# Patient Record
Sex: Female | Born: 1945 | Race: Black or African American | Hispanic: No | Marital: Married | Smoking: Former smoker
Health system: Southern US, Community
[De-identification: ages and names within clinical notes are randomized; demographics above are authoritative.]

## PROBLEM LIST (undated history)

## (undated) DIAGNOSIS — K219 Gastro-esophageal reflux disease without esophagitis: Secondary | ICD-10-CM

## (undated) DIAGNOSIS — E785 Hyperlipidemia, unspecified: Secondary | ICD-10-CM

## (undated) DIAGNOSIS — I10 Essential (primary) hypertension: Secondary | ICD-10-CM

## (undated) HISTORY — DX: Gastro-esophageal reflux disease without esophagitis: K21.9

## (undated) HISTORY — DX: Essential (primary) hypertension: I10

## (undated) HISTORY — DX: Hyperlipidemia, unspecified: E78.5

## (undated) HISTORY — PX: CHOLECYSTECTOMY: SHX55

---

## 1999-10-21 ENCOUNTER — Emergency Department (HOSPITAL_COMMUNITY): Admission: EM | Admit: 1999-10-21 | Discharge: 1999-10-21 | Payer: Self-pay | Admitting: Emergency Medicine

## 1999-10-22 ENCOUNTER — Encounter: Payer: Self-pay | Admitting: Emergency Medicine

## 2000-10-18 ENCOUNTER — Ambulatory Visit (HOSPITAL_COMMUNITY): Admission: RE | Admit: 2000-10-18 | Discharge: 2000-10-18 | Payer: Self-pay | Admitting: Family Medicine

## 2000-10-18 ENCOUNTER — Encounter: Payer: Self-pay | Admitting: Family Medicine

## 2001-06-12 ENCOUNTER — Other Ambulatory Visit: Admission: RE | Admit: 2001-06-12 | Discharge: 2001-06-12 | Payer: Self-pay | Admitting: Family Medicine

## 2001-10-23 ENCOUNTER — Ambulatory Visit (HOSPITAL_COMMUNITY): Admission: RE | Admit: 2001-10-23 | Discharge: 2001-10-23 | Payer: Self-pay | Admitting: Family Medicine

## 2001-10-23 ENCOUNTER — Encounter: Payer: Self-pay | Admitting: Family Medicine

## 2002-06-16 ENCOUNTER — Encounter: Payer: Self-pay | Admitting: Family Medicine

## 2002-06-16 ENCOUNTER — Ambulatory Visit (HOSPITAL_COMMUNITY): Admission: RE | Admit: 2002-06-16 | Discharge: 2002-06-16 | Payer: Self-pay | Admitting: Family Medicine

## 2002-06-23 ENCOUNTER — Encounter: Payer: Self-pay | Admitting: *Deleted

## 2002-06-23 ENCOUNTER — Emergency Department (HOSPITAL_COMMUNITY): Admission: EM | Admit: 2002-06-23 | Discharge: 2002-06-23 | Payer: Self-pay | Admitting: *Deleted

## 2002-10-27 ENCOUNTER — Ambulatory Visit (HOSPITAL_COMMUNITY): Admission: RE | Admit: 2002-10-27 | Discharge: 2002-10-27 | Payer: Self-pay | Admitting: Family Medicine

## 2002-10-27 ENCOUNTER — Encounter: Payer: Self-pay | Admitting: Family Medicine

## 2003-03-13 ENCOUNTER — Ambulatory Visit (HOSPITAL_COMMUNITY): Admission: RE | Admit: 2003-03-13 | Discharge: 2003-03-13 | Payer: Self-pay | Admitting: Family Medicine

## 2003-10-30 ENCOUNTER — Ambulatory Visit (HOSPITAL_COMMUNITY): Admission: RE | Admit: 2003-10-30 | Discharge: 2003-10-30 | Payer: Self-pay | Admitting: Family Medicine

## 2004-05-12 ENCOUNTER — Ambulatory Visit: Payer: Self-pay | Admitting: Family Medicine

## 2004-06-14 ENCOUNTER — Ambulatory Visit: Payer: Self-pay | Admitting: Family Medicine

## 2004-11-01 ENCOUNTER — Ambulatory Visit (HOSPITAL_COMMUNITY): Admission: RE | Admit: 2004-11-01 | Discharge: 2004-11-01 | Payer: Self-pay | Admitting: Family Medicine

## 2004-11-17 ENCOUNTER — Ambulatory Visit: Payer: Self-pay | Admitting: Family Medicine

## 2005-05-09 ENCOUNTER — Ambulatory Visit: Payer: Self-pay | Admitting: Family Medicine

## 2005-06-20 ENCOUNTER — Ambulatory Visit: Payer: Self-pay | Admitting: Family Medicine

## 2005-10-06 ENCOUNTER — Ambulatory Visit: Payer: Self-pay | Admitting: Family Medicine

## 2005-11-02 ENCOUNTER — Ambulatory Visit (HOSPITAL_COMMUNITY): Admission: RE | Admit: 2005-11-02 | Discharge: 2005-11-02 | Payer: Self-pay | Admitting: Family Medicine

## 2005-11-03 ENCOUNTER — Ambulatory Visit: Payer: Self-pay | Admitting: Family Medicine

## 2005-12-19 ENCOUNTER — Ambulatory Visit (HOSPITAL_COMMUNITY): Admission: RE | Admit: 2005-12-19 | Discharge: 2005-12-19 | Payer: Self-pay | Admitting: General Surgery

## 2006-05-29 ENCOUNTER — Ambulatory Visit: Payer: Self-pay | Admitting: Family Medicine

## 2006-07-06 ENCOUNTER — Encounter: Payer: Self-pay | Admitting: Family Medicine

## 2006-07-06 LAB — CONVERTED CEMR LAB
Alkaline Phosphatase: 99 units/L (ref 39–117)
Basophils Absolute: 0 10*3/uL (ref 0.0–0.1)
Basophils Relative: 1 % (ref 0–1)
Bilirubin, Direct: 0.1 mg/dL (ref 0.0–0.3)
Creatinine, Ser: 0.73 mg/dL (ref 0.40–1.20)
Eosinophils Absolute: 0.1 10*3/uL (ref 0.0–0.7)
Eosinophils Relative: 1 % (ref 0–5)
Glucose, Bld: 92 mg/dL (ref 70–99)
Hemoglobin: 13.7 g/dL (ref 12.0–15.0)
Indirect Bilirubin: 0.4 mg/dL (ref 0.0–0.9)
LDL Cholesterol: 94 mg/dL (ref 0–99)
Monocytes Absolute: 0.3 10*3/uL (ref 0.2–0.7)
Neutro Abs: 1.9 10*3/uL (ref 1.7–7.7)
Sodium: 141 meq/L (ref 135–145)
TSH: 2.081 microintl units/mL (ref 0.350–5.50)
Triglycerides: 39 mg/dL (ref <150)
WBC: 4.3 10*3/uL (ref 4.0–10.5)

## 2006-07-10 ENCOUNTER — Ambulatory Visit: Payer: Self-pay | Admitting: Family Medicine

## 2006-07-10 ENCOUNTER — Other Ambulatory Visit: Admission: RE | Admit: 2006-07-10 | Discharge: 2006-07-10 | Payer: Self-pay | Admitting: Family Medicine

## 2006-10-10 ENCOUNTER — Ambulatory Visit: Payer: Self-pay | Admitting: Family Medicine

## 2006-11-05 ENCOUNTER — Ambulatory Visit (HOSPITAL_COMMUNITY): Admission: RE | Admit: 2006-11-05 | Discharge: 2006-11-05 | Payer: Self-pay | Admitting: Family Medicine

## 2007-01-22 ENCOUNTER — Ambulatory Visit: Payer: Self-pay | Admitting: Family Medicine

## 2007-04-18 ENCOUNTER — Ambulatory Visit: Payer: Self-pay | Admitting: Family Medicine

## 2007-04-27 ENCOUNTER — Encounter: Payer: Self-pay | Admitting: Family Medicine

## 2007-04-27 LAB — CONVERTED CEMR LAB
AST: 18 units/L (ref 0–37)
Albumin: 4.6 g/dL (ref 3.5–5.2)
Alkaline Phosphatase: 84 units/L (ref 39–117)
Cholesterol: 173 mg/dL (ref 0–200)
HDL: 65 mg/dL (ref >39)
Total Bilirubin: 0.5 mg/dL (ref 0.3–1.2)
Total CHOL/HDL Ratio: 2.7
VLDL: 11 mg/dL (ref 0–40)

## 2007-05-09 ENCOUNTER — Encounter: Payer: Self-pay | Admitting: Family Medicine

## 2007-07-15 ENCOUNTER — Ambulatory Visit: Payer: Self-pay | Admitting: Family Medicine

## 2007-07-17 ENCOUNTER — Encounter: Payer: Self-pay | Admitting: Family Medicine

## 2007-09-02 ENCOUNTER — Encounter (INDEPENDENT_AMBULATORY_CARE_PROVIDER_SITE_OTHER): Payer: Self-pay | Admitting: *Deleted

## 2007-09-02 ENCOUNTER — Other Ambulatory Visit: Admission: RE | Admit: 2007-09-02 | Discharge: 2007-09-02 | Payer: Self-pay | Admitting: Family Medicine

## 2007-09-02 ENCOUNTER — Encounter: Payer: Self-pay | Admitting: Family Medicine

## 2007-09-02 ENCOUNTER — Ambulatory Visit: Payer: Self-pay | Admitting: Family Medicine

## 2007-09-05 ENCOUNTER — Ambulatory Visit (HOSPITAL_COMMUNITY): Admission: RE | Admit: 2007-09-05 | Discharge: 2007-09-05 | Payer: Self-pay | Admitting: Family Medicine

## 2007-09-17 ENCOUNTER — Encounter (INDEPENDENT_AMBULATORY_CARE_PROVIDER_SITE_OTHER): Payer: Self-pay | Admitting: *Deleted

## 2007-09-17 DIAGNOSIS — I10 Essential (primary) hypertension: Secondary | ICD-10-CM | POA: Insufficient documentation

## 2007-09-17 DIAGNOSIS — E785 Hyperlipidemia, unspecified: Secondary | ICD-10-CM | POA: Insufficient documentation

## 2007-09-17 DIAGNOSIS — M129 Arthropathy, unspecified: Secondary | ICD-10-CM | POA: Insufficient documentation

## 2007-09-17 DIAGNOSIS — K219 Gastro-esophageal reflux disease without esophagitis: Secondary | ICD-10-CM | POA: Insufficient documentation

## 2007-09-17 DIAGNOSIS — M19019 Primary osteoarthritis, unspecified shoulder: Secondary | ICD-10-CM | POA: Insufficient documentation

## 2007-09-17 DIAGNOSIS — M8580 Other specified disorders of bone density and structure, unspecified site: Secondary | ICD-10-CM | POA: Insufficient documentation

## 2007-09-23 ENCOUNTER — Ambulatory Visit: Payer: Self-pay | Admitting: Family Medicine

## 2007-10-09 ENCOUNTER — Ambulatory Visit (HOSPITAL_COMMUNITY): Admission: RE | Admit: 2007-10-09 | Discharge: 2007-10-09 | Payer: Self-pay | Admitting: Orthopedic Surgery

## 2007-11-08 ENCOUNTER — Encounter: Payer: Self-pay | Admitting: Family Medicine

## 2007-11-08 LAB — CONVERTED CEMR LAB
ALT: 15 units/L (ref 0–35)
AST: 19 units/L (ref 0–37)
Calcium: 9.4 mg/dL (ref 8.4–10.5)
Chloride: 107 meq/L (ref 96–112)
Cholesterol: 172 mg/dL (ref 0–200)
Creatinine, Ser: 0.77 mg/dL (ref 0.40–1.20)
Glucose, Bld: 93 mg/dL (ref 70–99)
Potassium: 4.1 meq/L (ref 3.5–5.3)
Sodium: 143 meq/L (ref 135–145)
Triglycerides: 44 mg/dL (ref <150)

## 2007-11-20 ENCOUNTER — Ambulatory Visit (HOSPITAL_COMMUNITY): Admission: RE | Admit: 2007-11-20 | Discharge: 2007-11-20 | Payer: Self-pay | Admitting: Family Medicine

## 2007-11-26 ENCOUNTER — Ambulatory Visit: Payer: Self-pay | Admitting: Family Medicine

## 2007-12-02 ENCOUNTER — Ambulatory Visit (HOSPITAL_COMMUNITY): Admission: RE | Admit: 2007-12-02 | Discharge: 2007-12-02 | Payer: Self-pay | Admitting: Family Medicine

## 2008-02-19 ENCOUNTER — Encounter: Payer: Self-pay | Admitting: Family Medicine

## 2008-03-30 ENCOUNTER — Ambulatory Visit: Payer: Self-pay | Admitting: Family Medicine

## 2008-03-30 DIAGNOSIS — J301 Allergic rhinitis due to pollen: Secondary | ICD-10-CM | POA: Insufficient documentation

## 2008-04-04 ENCOUNTER — Emergency Department (HOSPITAL_COMMUNITY): Admission: EM | Admit: 2008-04-04 | Discharge: 2008-04-04 | Payer: Self-pay | Admitting: Emergency Medicine

## 2008-04-13 ENCOUNTER — Ambulatory Visit: Payer: Self-pay | Admitting: Family Medicine

## 2008-04-13 LAB — CONVERTED CEMR LAB
Bilirubin Urine: NEGATIVE
Glucose, Urine, Semiquant: NEGATIVE
Ketones, urine, test strip: NEGATIVE
Nitrite: NEGATIVE
Protein, U semiquant: NEGATIVE
Specific Gravity, Urine: <1.005
Urobilinogen, UA: 0.2
pH: 5.5

## 2008-04-15 ENCOUNTER — Encounter: Payer: Self-pay | Admitting: Family Medicine

## 2008-04-22 ENCOUNTER — Telehealth: Payer: Self-pay | Admitting: Family Medicine

## 2008-07-18 ENCOUNTER — Encounter: Payer: Self-pay | Admitting: Family Medicine

## 2008-07-18 LAB — CONVERTED CEMR LAB
ALT: 23 units/L (ref 0–35)
AST: 25 units/L (ref 0–37)
Albumin: 4.4 g/dL (ref 3.5–5.2)
Alkaline Phosphatase: 70 units/L (ref 39–117)
Bilirubin, Direct: 0.1 mg/dL (ref 0.0–0.3)
Indirect Bilirubin: 0.4 mg/dL (ref 0.0–0.9)
LDL Cholesterol: 78 mg/dL (ref 0–99)
Total Bilirubin: 0.5 mg/dL (ref 0.3–1.2)
Triglycerides: 53 mg/dL (ref <150)
VLDL: 11 mg/dL (ref 0–40)

## 2008-07-29 ENCOUNTER — Ambulatory Visit: Payer: Self-pay | Admitting: Family Medicine

## 2008-08-14 ENCOUNTER — Ambulatory Visit: Payer: Self-pay | Admitting: Family Medicine

## 2008-08-14 DIAGNOSIS — R519 Headache, unspecified: Secondary | ICD-10-CM | POA: Insufficient documentation

## 2008-08-14 DIAGNOSIS — R51 Headache: Secondary | ICD-10-CM | POA: Insufficient documentation

## 2008-08-28 ENCOUNTER — Ambulatory Visit (HOSPITAL_COMMUNITY): Admission: RE | Admit: 2008-08-28 | Discharge: 2008-08-28 | Payer: Self-pay | Admitting: Family Medicine

## 2008-08-31 ENCOUNTER — Telehealth: Payer: Self-pay | Admitting: Family Medicine

## 2008-08-31 ENCOUNTER — Ambulatory Visit: Payer: Self-pay | Admitting: Family Medicine

## 2008-10-16 ENCOUNTER — Encounter: Payer: Self-pay | Admitting: Family Medicine

## 2008-10-19 ENCOUNTER — Encounter: Payer: Self-pay | Admitting: Family Medicine

## 2008-10-27 ENCOUNTER — Encounter (HOSPITAL_COMMUNITY): Admission: RE | Admit: 2008-10-27 | Discharge: 2008-11-26 | Payer: Self-pay | Admitting: Endocrinology

## 2008-10-27 ENCOUNTER — Ambulatory Visit (HOSPITAL_COMMUNITY): Payer: Self-pay | Admitting: Endocrinology

## 2008-11-13 ENCOUNTER — Telehealth: Payer: Self-pay | Admitting: Family Medicine

## 2008-11-23 ENCOUNTER — Ambulatory Visit: Payer: Self-pay | Admitting: Family Medicine

## 2008-11-23 ENCOUNTER — Encounter: Payer: Self-pay | Admitting: Family Medicine

## 2008-11-23 ENCOUNTER — Other Ambulatory Visit: Admission: RE | Admit: 2008-11-23 | Discharge: 2008-11-23 | Payer: Self-pay | Admitting: Family Medicine

## 2008-12-09 ENCOUNTER — Ambulatory Visit (HOSPITAL_COMMUNITY): Admission: RE | Admit: 2008-12-09 | Discharge: 2008-12-09 | Payer: Self-pay | Admitting: Family Medicine

## 2009-01-28 ENCOUNTER — Encounter (INDEPENDENT_AMBULATORY_CARE_PROVIDER_SITE_OTHER): Payer: Self-pay | Admitting: *Deleted

## 2009-01-28 LAB — CONVERTED CEMR LAB
AST: 22 units/L
Alkaline Phosphatase: 90 units/L
BUN: 16 mg/dL
CO2: 23 meq/L
Calcium: 9.4 mg/dL
Glucose, Bld: 105 mg/dL
HDL: 61 mg/dL
LDL Cholesterol: 135 mg/dL
Sodium: 140 meq/L
TSH: 1.921 microintl units/mL
Triglycerides: 57 mg/dL

## 2009-02-04 ENCOUNTER — Ambulatory Visit: Payer: Self-pay | Admitting: Family Medicine

## 2009-02-09 ENCOUNTER — Encounter: Payer: Self-pay | Admitting: Family Medicine

## 2009-02-10 LAB — CONVERTED CEMR LAB
Alkaline Phosphatase: 90 units/L (ref 39–117)
Basophils Absolute: 0 10*3/uL (ref 0.0–0.1)
Basophils Relative: 1 % (ref 0–1)
Bilirubin, Direct: 0.1 mg/dL (ref 0.0–0.3)
Calcium: 9.4 mg/dL (ref 8.4–10.5)
Eosinophils Absolute: 0.1 10*3/uL (ref 0.0–0.7)
Eosinophils Relative: 2 % (ref 0–5)
HDL: 61 mg/dL (ref >39)
Indirect Bilirubin: 0.3 mg/dL (ref 0.0–0.9)
MCHC: 33.8 g/dL (ref 30.0–36.0)
Monocytes Absolute: 0.4 10*3/uL (ref 0.1–1.0)
Monocytes Relative: 8 % (ref 3–12)
Neutrophils Relative %: 44 % (ref 43–77)
Platelets: 264 10*3/uL (ref 150–400)
Potassium: 4 meq/L (ref 3.5–5.3)
RDW: 12.9 % (ref 11.5–15.5)
Total Bilirubin: 0.4 mg/dL (ref 0.3–1.2)
Total CHOL/HDL Ratio: 3.4
Total Protein: 7.5 g/dL (ref 6.0–8.3)
Triglycerides: 57 mg/dL (ref <150)

## 2009-03-22 ENCOUNTER — Encounter: Payer: Self-pay | Admitting: Family Medicine

## 2009-04-15 ENCOUNTER — Ambulatory Visit: Payer: Self-pay | Admitting: Family Medicine

## 2009-05-12 ENCOUNTER — Ambulatory Visit: Payer: Self-pay | Admitting: Family Medicine

## 2009-05-31 ENCOUNTER — Telehealth: Payer: Self-pay | Admitting: Family Medicine

## 2009-06-22 ENCOUNTER — Ambulatory Visit: Payer: Self-pay | Admitting: Family Medicine

## 2009-06-22 DIAGNOSIS — R079 Chest pain, unspecified: Secondary | ICD-10-CM | POA: Insufficient documentation

## 2009-06-24 ENCOUNTER — Encounter: Payer: Self-pay | Admitting: Family Medicine

## 2009-07-08 ENCOUNTER — Encounter (INDEPENDENT_AMBULATORY_CARE_PROVIDER_SITE_OTHER): Payer: Self-pay | Admitting: *Deleted

## 2009-07-13 ENCOUNTER — Ambulatory Visit: Payer: Self-pay | Admitting: Cardiology

## 2009-07-14 ENCOUNTER — Encounter: Payer: Self-pay | Admitting: Cardiology

## 2009-07-26 ENCOUNTER — Ambulatory Visit: Payer: Self-pay | Admitting: Cardiology

## 2009-08-09 ENCOUNTER — Telehealth: Payer: Self-pay | Admitting: Family Medicine

## 2009-08-11 ENCOUNTER — Telehealth: Payer: Self-pay | Admitting: Family Medicine

## 2009-08-18 ENCOUNTER — Encounter: Payer: Self-pay | Admitting: Family Medicine

## 2009-09-03 ENCOUNTER — Telehealth: Payer: Self-pay | Admitting: Family Medicine

## 2009-10-05 ENCOUNTER — Encounter: Payer: Self-pay | Admitting: Family Medicine

## 2009-10-19 LAB — CONVERTED CEMR LAB
ALT: 20 units/L (ref 0–35)
AST: 22 units/L (ref 0–37)
Albumin: 4.4 g/dL (ref 3.5–5.2)
Basophils Relative: 0 % (ref 0–1)
Bilirubin, Direct: 0.1 mg/dL (ref 0.0–0.3)
CO2: 23 meq/L (ref 19–32)
Chloride: 106 meq/L (ref 96–112)
Cholesterol: 213 mg/dL — ABNORMAL HIGH (ref 0–200)
Creatinine, Ser: 0.79 mg/dL (ref 0.40–1.20)
Eosinophils Absolute: 0.1 10*3/uL (ref 0.0–0.7)
Hemoglobin: 13 g/dL (ref 12.0–15.0)
LDL Cholesterol: 143 mg/dL — ABNORMAL HIGH (ref 0–99)
Lymphs Abs: 2.1 10*3/uL (ref 0.7–4.0)
MCHC: 33.2 g/dL (ref 30.0–36.0)
Monocytes Absolute: 0.3 10*3/uL (ref 0.1–1.0)
Monocytes Relative: 6 % (ref 3–12)
Neutro Abs: 2.4 10*3/uL (ref 1.7–7.7)
Potassium: 4.5 meq/L (ref 3.5–5.3)
RBC: 4.48 M/uL (ref 3.87–5.11)
RDW: 12.7 % (ref 11.5–15.5)
Vit D, 25-Hydroxy: 32 ng/mL (ref 30–89)
WBC: 4.9 10*3/uL (ref 4.0–10.5)

## 2009-11-25 ENCOUNTER — Ambulatory Visit: Payer: Self-pay | Admitting: Family Medicine

## 2009-11-25 ENCOUNTER — Other Ambulatory Visit: Admission: RE | Admit: 2009-11-25 | Discharge: 2009-11-25 | Payer: Self-pay | Admitting: Family Medicine

## 2009-11-25 LAB — CONVERTED CEMR LAB: OCCULT 1: NEGATIVE

## 2009-11-29 ENCOUNTER — Encounter: Payer: Self-pay | Admitting: Family Medicine

## 2009-11-29 LAB — CONVERTED CEMR LAB: Pap Smear: NEGATIVE

## 2009-12-06 ENCOUNTER — Encounter: Payer: Self-pay | Admitting: Gastroenterology

## 2009-12-09 ENCOUNTER — Encounter: Payer: Self-pay | Admitting: Gastroenterology

## 2009-12-10 ENCOUNTER — Encounter: Payer: Self-pay | Admitting: Family Medicine

## 2009-12-10 ENCOUNTER — Ambulatory Visit (HOSPITAL_COMMUNITY): Admission: RE | Admit: 2009-12-10 | Discharge: 2009-12-10 | Payer: Self-pay | Admitting: Gastroenterology

## 2009-12-10 ENCOUNTER — Ambulatory Visit: Payer: Self-pay | Admitting: Gastroenterology

## 2009-12-13 ENCOUNTER — Ambulatory Visit (HOSPITAL_COMMUNITY): Admission: RE | Admit: 2009-12-13 | Discharge: 2009-12-13 | Payer: Self-pay | Admitting: Family Medicine

## 2009-12-20 ENCOUNTER — Telehealth: Payer: Self-pay | Admitting: Family Medicine

## 2010-01-12 ENCOUNTER — Ambulatory Visit: Payer: Self-pay | Admitting: Family Medicine

## 2010-05-11 ENCOUNTER — Telehealth: Payer: Self-pay | Admitting: Family Medicine

## 2010-05-16 LAB — CONVERTED CEMR LAB
ALT: 19 units/L (ref 0–35)
Albumin: 4.5 g/dL (ref 3.5–5.2)
BUN: 15 mg/dL (ref 6–23)
Chloride: 103 meq/L (ref 96–112)
Cholesterol: 250 mg/dL — ABNORMAL HIGH (ref 0–200)
Creatinine, Ser: 0.87 mg/dL (ref 0.40–1.20)
Glucose, Bld: 97 mg/dL (ref 70–99)
Potassium: 4.3 meq/L (ref 3.5–5.3)
Total CHOL/HDL Ratio: 4.9
Triglycerides: 73 mg/dL (ref <150)
VLDL: 15 mg/dL (ref 0–40)

## 2010-05-18 ENCOUNTER — Ambulatory Visit
Admission: RE | Admit: 2010-05-18 | Discharge: 2010-05-18 | Payer: Self-pay | Source: Home / Self Care | Attending: Family Medicine | Admitting: Family Medicine

## 2010-05-30 ENCOUNTER — Encounter: Payer: Self-pay | Admitting: Family Medicine

## 2010-06-07 NOTE — Progress Notes (Signed)
Summary: rx  Phone Note Call from Patient   Summary of Call: pt wants to know if rx has been sent to drug store.  [phone] Initial call taken by: Rudene Anda,  December 20, 2009 2:50 PM  Follow-up for Phone Call        returned call, patient not available, left message  did she happen to say what rx? did she check with  pharmacy? Follow-up by: Adella Hare LPN,  December 21, 2009 10:42 AM  Additional Follow-up for Phone Call Additional follow up Details #1::        pt stated that pharm had done faxed it over here  Additional Follow-up by: Rudene Anda,  December 21, 2009 11:36 AM    Additional Follow-up for Phone Call Additional follow up Details #2::    will you be sending in calcium nasal spray? Follow-up by: Adella Hare LPN,  December 21, 2009 2:42 PM  Additional Follow-up for Phone Call Additional follow up Details #3:: Details for Additional Follow-up Action Taken: spray faxed in pls let her know Additional Follow-up by: Syliva Overman MD,  December 21, 2009 5:05 PM  New/Updated Medications: MIACALCIN 200 UNIT/ML SOLN (CALCITONIN (SALMON)) 200 units once daily , alternate nostrils(1 spray total each day) Prescriptions: MIACALCIN 200 UNIT/ML SOLN (CALCITONIN (SALMON)) 200 units once daily , alternate nostrils(1 spray total each day)  #3.40ml x 11   Entered and Authorized by:   Syliva Overman MD   Signed by:   Syliva Overman MD on 12/21/2009   Method used:   [facility]        [facility] [phone]* (retail)       [address]       [place], [place]  [address]       Ph: [phone] or [phone]       Fax: [phone]   RxID:   [id number]  patient aware Adella Hare LPN  December 22, 2009 10:51 AM

## 2010-06-07 NOTE — Assessment & Plan Note (Signed)
Summary: flu shot  Nurse Visit   Allergies: 1)  ! Asa  Immunizations Administered:  Influenza Vaccine # 1:    Vaccine Type: Fluvax Non-MCR    Site: left deltoid    Mfr: novartis    Dose: 0.5 ml    Route: IM    Given by: Adella Hare LPN    Exp. Date: 09/2010    Lot #: [id number]    VIS given: 11/30/09 version given January 12, 2010.  Orders Added: 1)  Influenza Vaccine NON MCR [00028]

## 2010-06-07 NOTE — Letter (Signed)
Summary: TRIAGE SHEET  TRIAGE SHEET   Imported By: Rexene Alberts 12/06/2009 09:59:40  _____________________________________________________________________  External Attachment:    Type:   Image     Comment:   External Document  Appended Document: TRIAGE SHEET SUPREP.  Appended Document: TRIAGE SHEET Reviewed instructions with pt. [facility]

## 2010-06-07 NOTE — Progress Notes (Signed)
Summary: LAB  Phone Note Call from Patient   Summary of Call: NEEDS A LB ORDER AND WANTS TO KNOW WHEN TO DO THE LAB   CALL BACK AT [phone] Initial call taken by: Lind Guest,  September 03, 2009 9:27 AM  Follow-up for Phone Call        patient aware, labs ordered Follow-up by: Adella Hare LPN,  September 03, 2009 2:22 PM

## 2010-06-07 NOTE — Assessment & Plan Note (Signed)
Summary: OV   Vital Signs:  Patient profile:   65 year old female Menstrual status:  postmenopausal Height:      62 inches Weight:      142.50 pounds BMI:     26.16 O2 Sat:      95 % on Room air Temp:     98.2 degrees F oral Pulse rate:   83 / minute Pulse rhythm:   regular Resp:     16 per minute BP sitting:   120 / 70  (left arm)  Vitals Entered By: Worthy Keeler LPN (May 12, 2009 10:21 AM)  Nutrition Counseling: Patient's BMI is greater than 25 and therefore counseled on weight management options.  O2 Flow:  Room air CC: body aches, head congestion, cough (clear) x 3days Is Patient Diabetic? No Pain Assessment Patient in pain? no        Primary Care Provider:  Syliva Overman MD  CC:  body aches, head congestion, and cough (clear) x 3days.  History of Present Illness: 4 day h/o progressive head and chest congestion with generalised body aches and chills. No documented fever , but she thinks that she has ahd a temp. Prior to this she Colleen [name] been doing well, she has no other complaints on review.  Current Medications (verified): 1)  Omeprazole 20 Mg  Cpdr (Omeprazole) .... One Cap By Mouth Once Daily 2)  Mevacor 40 Mg Tabs (Lovastatin) .... Two Tablets At Bedtime 3)  Lisinopril-Hydrochlorothiazide 20-25 Mg Tabs (Lisinopril-Hydrochlorothiazide) .... Take 1 Tablet By Mouth Once A Day 4)  Zithromax Z-Pak 250 Mg Tabs (Azithromycin) .... 2 Tablets Today Then One Tablet Daily For The Next 4 Days 5)  Fluconazole 150 Mg Tabs (Fluconazole) .... Take 1 Tablet By Mouth Once A Day As Needed 6)  Tessalon Perles 100 Mg Caps (Benzonatate) .... Take 1 Capsule By Mouth Three Times A Day 7)  Tussionex Pennkinetic Er 8-10 Mg/44ml Lqcr (Chlorpheniramine-Hydrocodone) .... One Teaspoon Twice Daily As Needed For Cough  Allergies (verified): 1)  ! Asa  Review of Systems      See HPI Eyes:  Denies blurring and discharge. CV:  Denies chest pain or discomfort, palpitations, and swelling  of feet. GI:  Denies abdominal pain, change in bowel habits, constipation, diarrhea, nausea, and vomiting. GU:  Denies dysuria and urinary frequency. MS:  Complains of joint pain and muscle weakness; denies stiffness; 4 day history. Neuro:  Complains of headaches; denies seizures and sensation of room spinning; 4 day history with frontal and maxillary pressure. Psych:  Denies anxiety and depression. Heme:  Denies abnormal bruising and bleeding. Allergy:  Complains of seasonal allergies.  Physical Exam  General:  Well-developed,well-nourished,in no acute distress; alert,appropriate and cooperative throughout examination HEENT: No facial asymmetry,  EOMI, positive sinus tenderness, TM's Clear, oropharynx  pink and moist.   Chest: decreased air ntry, bilateral crackles and wheezes CVS: S1, S2, No murmurs, No S3.   Abd: Soft, Nontender.  MS: Adequate ROM spine, hips, shoulders and knees.  Ext: No edema.   CNS: CN 2-12 intact, power tone and sensation normal throughout.   Skin: Intact, no visible lesions or rashes.  Psych: Good eye contact, normal affect.  Memory intact, not anxious or depressed appearing.    Impression & Recommendations:  Problem # 1:  ACUTE SINUSITIS, UNSPECIFIED (ICD-461.9) Assessment Comment Only  Her updated medication list for this problem includes:    Tessalon Perles 100 Mg Caps (Benzonatate) .Marland Kitchen... Take 1 capsule by mouth three times a day  Tussionex Pennkinetic Er 8-10 Mg/42ml Lqcr (Chlorpheniramine-hydrocodone) ..... One teaspoon twice daily as needed for cough Zpack  Problem # 2:  ACUTE BRONCHITIS (ICD-466.0) Assessment: Comment Only  Her updated medication list for this problem includes:    Tessalon Perles 100 Mg Caps (Benzonatate) .Marland Kitchen... Take 1 capsule by mouth three times a day    Tussionex Pennkinetic Er 8-10 Mg/12ml Lqcr (Chlorpheniramine-hydrocodone) ..... One teaspoon twice daily as needed for cough Z pack  Problem # 3:  HYPERLIPIDEMIA  (ICD-272.4) Assessment: Comment Only  Her updated medication list for this problem includes:    Mevacor 40 Mg Tabs (Lovastatin) .Marland Kitchen..Marland Kitchen Two tablets at bedtime  Labs Reviewed: SGOT: 22 (01/28/2009)   SGPT: 16 (01/28/2009)   HDL:61 (01/28/2009), 58 (07/18/2008)  LDL:135 (01/28/2009), 78 (16/02/9603)  Chol:207 (01/28/2009), 147 (07/18/2008)  Trig:57 (01/28/2009), 53 (07/18/2008)  Problem # 4:  HYPERTENSION (ICD-401.9) Assessment: Improved  Her updated medication list for this problem includes:    Lisinopril-hydrochlorothiazide 20-25 Mg Tabs (Lisinopril-hydrochlorothiazide) .Marland Kitchen... Take 1 tablet by mouth once a day  BP today: 120/70 Prior BP: 130/82 (04/15/2009)  Labs Reviewed: K+: 4.0 (01/28/2009) Creat: : 0.82 (01/28/2009)   Chol: 207 (01/28/2009)   HDL: 61 (01/28/2009)   LDL: 135 (01/28/2009)   TG: 57 (01/28/2009)  Problem # 5:  GERD (ICD-530.81) Assessment: Unchanged  Her updated medication list for this problem includes:    Omeprazole 20 Mg Cpdr (Omeprazole) ..... One cap by mouth once daily  Complete Medication List: 1)  Omeprazole 20 Mg Cpdr (Omeprazole) .... One cap by mouth once daily 2)  Mevacor 40 Mg Tabs (Lovastatin) .... Two tablets at bedtime 3)  Lisinopril-hydrochlorothiazide 20-25 Mg Tabs (Lisinopril-hydrochlorothiazide) .... Take 1 tablet by mouth once a day 4)  Fluconazole 150 Mg Tabs (Fluconazole) .... Take 1 tablet by mouth once a day as needed 5)  Tessalon Perles 100 Mg Caps (Benzonatate) .... Take 1 capsule by mouth three times a day 6)  Tussionex Pennkinetic Er 8-10 Mg/15ml Lqcr (Chlorpheniramine-hydrocodone) .... One teaspoon twice daily as needed for cough  Patient Instructions: 1)  pLS call for your appt for you physical themiddle to end of this month, it is due July 20 or after. 2)  you are being treated for sinusitis and bronchitis,meds have been sent in Prescriptions: OMEPRAZOLE 20 MG  CPDR (OMEPRAZOLE) one cap by mouth once daily  #30 x 5   Entered by:    Worthy Keeler LPN   Authorized by:   Syliva Overman MD   Signed by:   Worthy Keeler LPN on 54/01/8118   Method used:   [facility]        [facility] [phone]* (retail)       [address]       [place], [place]  [address]       Ph: [phone] or [phone]       Fax: [phone]   RxID:   [id number] [name] ER 8-10 MG/5ML LQCR (CHLORPHENIRAMINE-HYDROCODONE) one teaspoon twice daily as needed for cough  #300cc x 0   Entered and Authorized by:   Syliva Overman MD   Signed by:   Syliva Overman MD on 05/12/2009   Method used:   Printed then faxed to ...       [facility] [phone]* (retail)       [address]       [place], [place]  [address]  Ph: [phone] or [phone]       Fax: [phone]   RxID:   [id number] TESSALON PERLES 100 MG CAPS (BENZONATATE) Take 1 capsule by mouth three times a day  #21 x 0   Entered and Authorized by:   Syliva Overman MD   Signed by:   Syliva Overman MD on 05/12/2009   Method used:   Electronically to        [facility] [phone]* (retail)       [address]       [place], [place]  [address]       Ph: [phone] or [phone]       Fax: [phone]   RxID:   [id number] FLUCONAZOLE 150 MG TABS (FLUCONAZOLE) Take 1 tablet by mouth once a day as needed  #2 x 0   Entered and Authorized by:   Syliva Overman MD   Signed by:   Syliva Overman MD on 05/12/2009   Method used:   Electronically to        [facility] [phone]* (retail)       [address]       [place], [place]  [address]       Ph: [phone] or [phone]       Fax: [phone]   RxID:   [id number] ZITHROMAX Z-PAK 250 MG TABS (AZITHROMYCIN) 2 tablets today then one tablet daily for the next 4 days  #6 x 0   Entered and Authorized by:   Syliva Overman MD   Signed by:   Syliva Overman MD on 05/12/2009   Method used:   Electronically to        [facility].  [phone]* (retail)       [address]       [place], [place]  [address]       Ph: [phone] or [phone]       Fax: [phone]   RxID:   [id number]

## 2010-06-07 NOTE — Letter (Signed)
Summary: Letter  Letter   Imported By: Lind Guest 11/30/2009 13:55:11  _____________________________________________________________________  External Attachment:    Type:   Image     Comment:   External Document

## 2010-06-07 NOTE — Progress Notes (Signed)
Summary: dermatology  dermatology   Imported By: Lind Guest 10/26/2009 08:25:24  _____________________________________________________________________  External Attachment:    Type:   Image     Comment:   External Document

## 2010-06-07 NOTE — Assessment & Plan Note (Signed)
Summary: np3 chest pain n   Visit Type:  Initial Consult Primary Provider:  Syliva Overman MD   History of Present Illness: Colleen [name] is seen at the kind request of Dr. Lodema Hong for evaluation of chest discomfort.  This very nice 65 year old woman has enjoyed generally excellent health.  She has had no cardiovascular issues, has never been seen by cardiologist and never undergone any significant cardiac testing.  Exercise tolerance is good.  Late llast year she retired from fairly demanding physical work at [employer].  She has maintained normal activity since, without difficulty.  Current issues began in January when she developed a severe upper respiratory infection with a marked cough.  She experienced chest discomfort and back discomfort in conjunction with that illness.  She has had residual anterior chest heaviness.  She also notes a localized area in the right lateral back with pain and tenderness. Discomfort is increased by coughing, sneezing and by motion of the trunk.  There is no association with walking, anxiety or stress.  It is not entirely clear whether there has been any trend in pain intensity, but my best guess is that it has been improving.  She has not had a significant cough for a number of weeks.   EKG  Procedure date:  30-Jul-2009  Findings:      Normal sinus rhythm Minor nonspecific T wave abnormality Otherwise normal EKG Comparison with prior study of 06/22/09, no significant interval change.   Current Medications (verified): 1)  Omeprazole 20 Mg  Cpdr (Omeprazole) .... One Cap By Mouth Once Daily 2)  Mevacor 40 Mg Tabs (Lovastatin) .... Two Tablets At Bedtime 3)  Lisinopril-Hydrochlorothiazide 20-25 Mg Tabs (Lisinopril-Hydrochlorothiazide) .... Take 1 Tablet By Mouth Once A Day 4)  Tessalon Perles 100 Mg Caps (Benzonatate) .... Take 1 Capsule By Mouth Three Times A Day 5)  Lumigan 0.03 % Soln (Bimatoprost) .Marland Kitchen.. 1 Drop Each Eye At Bedtime 6)  Tylenol 325 Mg Tabs  (Acetaminophen) .... Take As Needed For Pain  Allergies (verified): 1)  ! Jonne Ply  Past History:  Past Surgical History: Last updated: 10/04/07 Cholecystectomy Oct 03, 2000)  Family History: Last updated: 07/30/2009 Mother deceased in 2008-10-03 at age 71,h/o hypertension and CHF Father deceased - CVA 2 living sisters - x1 HTN, DM, MS and x1HTN, DM, CAD-coronary    stents 2 living brothers - x1 healthy and x1 HTN and bipolar  Social History: Last updated: 2009/07/30 Employed: Recently retired from [employer] Married; 2 adult children Tobacco-minimal and remote Alcohol use-no Drug use-no  Past Medical History: Chest pain HYPERLIPIDEMIA (ICD-272.4) HYPERTENSION (ICD-401.9) ARTHRITIS, SHOULDERS, BILATERAL and KNEES, 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) GERD (ICD-530.81) OSTEOPOROSIS (ICD-733.00) Glaucoma diagnosed 03/2008  Family History: Mother deceased in 32 at age 71,h/o hypertension and CHF Father deceased - CVA 2 living sisters - x1 HTN, DM, MS and x1HTN, DM, CAD-coronary    stents 2 living brothers - x1 healthy and x1 HTN and bipolar  Social History: Employed: Recently retired from [employer] Married; 2 adult children Tobacco-minimal and remote Alcohol use-no Drug use-no  Review of Systems       intermittent headaches; corrective lenses; intermittent eye discomfort; history of peptic ulcer disease; arthritic discomfort, particularly involving the knees, back and shoulders.  All other systems reviewed and are negative.  Vital Signs:  Patient profile:   65 year old female Menstrual status:  postmenopausal Weight:      148 pounds Pulse rate:   75 / minute BP sitting:   125 / 66  (right arm)  Vitals Entered ByLarita Fife Via LPN (July 14, 5782 2:17 PM)  Physical Exam  General:    Mildly overweight; well-developed; no acute distress: HEENT-[place]/AT; PERRL; EOM intact; conjunctiva and lids nl:  Neck-No JVD; no carotid bruits: Endocrine-No thyromegaly: Lungs-No tachypnea, clear  without rales, rhonchi or wheezes: Thorax: mild tenderness over the right lateral eighth rib and the uppersternal area CV-normal PMI; normal S1 and S2; S4 present Abdomen-BS normal; soft and non-tender without masses or organomegaly: MS-No deformities, cyanosis or clubbing;  excellent mobility of back without pain on motion Neurologic-Nl cranial nerves; symmetric strength and tone: Skin- Warm, no sig. lesions: Extremities-Nl distal pulses; no edema  Head:     Impression & Recommendations:  Problem # 1:  CHEST PAIN (ICD-786.50) Symptoms are quite atypical and most consistent with a musculoskeletal origin.  Based upon the history available to me, the likely etiology is chest wall injury related to cough.  The duration and persistence of her symptoms are somewhat out of proportion to the acute illness.  Nonetheless, there is such a low suspicion for coronary disease that provocative testing is not warranted.  I have suggested that she take low dose naproxen for the next week and then return to see me in 2 weeks at which time I suspect she will have dramatically improved.  If not, we will seek other etiologies for her chest discomfort.   Patient Instructions: 1)  Your physician recommends that you schedule a follow-up appointment in: 2 WEEKS 2)  Your physician has recommended you make the following change in your medication:  TAKE ALEVE OR NAPROXEN 200MG  TWICE A DAY FOR 2 WEEKS

## 2010-06-07 NOTE — Op Note (Signed)
Summary: Operative Report  Operative Report   Imported By: Lind Guest 12/14/2009 10:20:12  _____________________________________________________________________  External Attachment:    Type:   Image     Comment:   External Document

## 2010-06-07 NOTE — Progress Notes (Signed)
Summary: SKIN DR.  Phone Note Call from Patient   Summary of Call: WANTS A REF. TO A SKIN DR. CALL BACK AT [phone] WITH APPT. Initial call taken by: Lind Guest,  August 09, 2009 9:07 AM  Follow-up for Phone Call        pls document i   a tele note what exactly her skin complaint is then after i review will give ok to refer I do not see mntion of skin prob in my last 2 office notes so I need this clarifiede    Follow-up by: Syliva Overman MD,  August 10, 2009 12:22 AM  Additional Follow-up for Phone Call Additional follow up Details #1::        dark spot on arm came up and is itching Additional Follow-up by: Adella Hare LPN,  August 11, 2009 10:45 AM    Additional Follow-up for Phone Call Additional follow up Details #2::    pt was referred and has appt on 08/18/2009 8:30. pt was called and left a message.  Follow-up by: Rudene Anda,  August 11, 2009 1:41 PM

## 2010-06-07 NOTE — Assessment & Plan Note (Signed)
Summary: 2 WK F/U PER CHECKOUT ON 07/13/09/TG   Visit Type:  Follow-up Primary Provider:  Syliva Overman MD  CC:  No cardiology problems today.  History of Present Illness: Colleen [name] is a very pleasant 65 y/o AAF who was last seen by Dr. Dietrich Pates as the request of Dr. Lodema Hong for Right sided chest pain.  The pain began in the setting of an acute upper respiratory infection with frequent coughing.  Dr. Dietrich Pates felt the pain was most consistant with musculoskeletal pain, based on her history and symptoms.  She had a low suspicion for CAD.  Therefore, she was placed on NSAIDS for 2 weeks and was to follow-up with [place] for results.    She is feeling 100% better.  No recurrance of symptoms at all.  She says she took 2 tablets daily for 3-4 days and then decreased the dose to 1 a day and then has stopped for the last few days.  The pain is not reproducible with palpation on exam.  She is in good spirits.  Current Medications (verified): 1)  Omeprazole 20 Mg  Cpdr (Omeprazole) .... One Cap By Mouth Once Daily 2)  Mevacor 40 Mg Tabs (Lovastatin) .... Two Tablets At Bedtime 3)  Lisinopril-Hydrochlorothiazide 20-25 Mg Tabs (Lisinopril-Hydrochlorothiazide) .... Take 1 Tablet By Mouth Once A Day 4)  Lumigan 0.03 % Soln (Bimatoprost) .Marland Kitchen.. 1 Drop Each Eye At Bedtime 5)  Tylenol 325 Mg Tabs (Acetaminophen) .... Take As Needed For Pain 6)  Naproxen Sodium 220 Mg Tabs (Naproxen Sodium) .... Take 1 Tab Daily  Allergies (verified): 1)  ! Asa  Review of Systems       All other systems have been reviewed and are negative unless stated above.   Vital Signs:  Patient profile:   65 year old female Menstrual status:  postmenopausal Weight:      143 pounds Pulse rate:   73 / minute BP sitting:   104 / 59  (right arm)  Vitals Entered By: Colleen Saa, CNA (July 26, 2009 1:49 PM)  Physical Exam  General:  Well developed, well nourished, in no acute distress. Lungs:  Clear bilaterally to auscultation  and percussion. Heart:  Non-displaced PMI, chest non-tender; regular rate and rhythm, S1, S2 without murmurs, rubs or gallops. Carotid upstroke normal, no bruit. Normal abdominal aortic size, no bruits. Femorals normal pulses, no bruits. Pedals normal pulses. No edema, no varicosities. Abdomen:  Bowel sounds positive; abdomen soft and non-tender without masses, organomegaly, or hernias noted. No hepatosplenomegaly. Msk:  No recurrance of pain with palpation or movement. Psych:  Normal affect.   Impression & Recommendations:  Problem # 1:  CHEST PAIN (ICD-786.50) This is completely resolved with the use of NSAIDS.  She has no other complaints.  We will see her on a as needed basis only. Her updated medication list for this problem includes:    Lisinopril-hydrochlorothiazide 20-25 Mg Tabs (Lisinopril-hydrochlorothiazide) .Marland Kitchen... Take 1 tablet by mouth once a day  Patient Instructions: 1)  Your physician recommends that you schedule a follow-up appointment in: as needed  2)  Your physician recommends that you continue on your current medications as directed. Please refer to the Current Medication list given to you today.

## 2010-06-07 NOTE — Assessment & Plan Note (Signed)
Summary: CHEST HURTS FROM COUGHING   Vital Signs:  Patient profile:   65 year old female Menstrual status:  postmenopausal Height:      62 inches Weight:      146.75 pounds BMI:     26.94 O2 Sat:      99 % on Room air Pulse rate:   68 / minute Resp:     16 per minute BP sitting:   158 / 74  (left arm)  Vitals Entered By: Lilyan Gilford LPN (June 22, 2009 3:06 PM)  O2 Flow:  Room air CC: chest aches, goes into left shoulder blade and left side and down left arm x one month Is Patient Diabetic? No Pain Assessment Patient in pain? yes     Location: chest Intensity: 5 Type: aching Onset of pain  Constant   Primary Care Provider:  Syliva Overman MD  CC:  chest aches and goes into left shoulder blade and left side and down left arm x one month.  History of Present Illness: Pt reports 1 mos hx of intermittent chest pain.  No shortness of breath, or diaphoresis.  Seems to be more when lying down, either during a daytime nap, or at bedtime, & improves with sitting up or moving.  Does feel like a heaviness in the left side of her chest.    Pain radiates to her left shoulder area when occurs.    Her mother died from a MI, so she is worried that this is due to her heart.   Allergies (verified): 1)  ! Asa PMH-FH-SH reviewed for relevance  Review of Systems General:  Denies fatigue, fever, and weakness. CV:  Complains of chest pain or discomfort; denies difficulty breathing at night, difficulty breathing while lying down, fainting, fatigue, lightheadness, palpitations, shortness of breath with exertion, and swelling of feet. Resp:  Denies chest pain with inspiration, cough, and shortness of breath; pt states she had a cough about a mos ago, but has resolved. MS:  Denies muscle aches and thoracic pain. Neuro:  Denies numbness and tingling.  Physical Exam  General:  Well-developed,well-nourished,in no acute distress; alert,appropriate and cooperative throughout  examination Head:  Normocephalic and atraumatic without obvious abnormalities. No apparent alopecia or balding. Ears:  External ear exam shows no significant lesions or deformities.  Otoscopic examination reveals clear canals, tympanic membranes are intact bilaterally without bulging, retraction, inflammation or discharge. Hearing is grossly normal bilaterally. Nose:  External nasal examination shows no deformity or inflammation. Nasal mucosa are pink and moist without lesions or exudates. Mouth:  Oral mucosa and oropharynx without lesions or exudates.  Teeth in good repair. Neck:  No deformities, masses, or tenderness noted. Chest Wall:  No deformities, masses, or tenderness noted. Lungs:  Normal respiratory effort, chest expands symmetrically. Lungs are clear to auscultation, no crackles or wheezes. Heart:  Normal rate and regular rhythm. S1 and S2 normal without gallop, murmur, click, rub or other extra sounds. Extremities:  No clubbing, cyanosis, edema, or deformity noted with normal full range of motion of all joints.   Psych:  Cognition and judgment appear intact. Alert and cooperative with normal attention span and concentration. No apparent delusions, illusions, hallucinations   Impression & Recommendations:  Problem # 1:  CHEST PAIN (ICD-786.50)  Orders: EKG w/ Interpretation (93000) Cardiology Referral (Cardiology)  Discussed with pt due to symptoms being positionally associated may be chest wall & not cardiac.  Due to her PMH & FH will refer to Cardiology for evaluation.  Problem #  2:  HYPERTENSION (ICD-401.9) Assessment: Unchanged  Her updated medication list for this problem includes:    Lisinopril-hydrochlorothiazide 20-25 Mg Tabs (Lisinopril-hydrochlorothiazide) .Marland Kitchen... Take 1 tablet by mouth once a day  BP today: 158/74 Prior BP: 120/70 (05/12/2009)  Labs Reviewed: K+: 4.0 (01/28/2009) Creat: : 0.82 (01/28/2009)   Chol: 207 (01/28/2009)   HDL: 61 (01/28/2009)   LDL:  135 (01/28/2009)   TG: 57 (01/28/2009)  Problem # 3:  HYPERLIPIDEMIA (ICD-272.4) Assessment: Unchanged  Her updated medication list for this problem includes:    Mevacor 40 Mg Tabs (Lovastatin) .Marland Kitchen..Marland Kitchen Two tablets at bedtime  Labs Reviewed: SGOT: 22 (01/28/2009)   SGPT: 16 (01/28/2009)   HDL:61 (01/28/2009), 58 (07/18/2008)  LDL:135 (01/28/2009), 78 (04/54/0981)  Chol:207 (01/28/2009), 147 (07/18/2008)  Trig:57 (01/28/2009), 53 (07/18/2008)  Complete Medication List: 1)  Omeprazole 20 Mg Cpdr (Omeprazole) .... One cap by mouth once daily 2)  Mevacor 40 Mg Tabs (Lovastatin) .... Two tablets at bedtime 3)  Lisinopril-hydrochlorothiazide 20-25 Mg Tabs (Lisinopril-hydrochlorothiazide) .... Take 1 tablet by mouth once a day 4)  Fluconazole 150 Mg Tabs (Fluconazole) .... Take 1 tablet by mouth once a day as needed 5)  Tessalon Perles 100 Mg Caps (Benzonatate) .... Take 1 capsule by mouth three times a day 6)  Tussionex Pennkinetic Er 8-10 Mg/26ml Lqcr (Chlorpheniramine-hydrocodone) .... One teaspoon twice daily as needed for cough  Patient Instructions: 1)  Take 650-1000mg  of Tylenol every 4-6 hours as needed for relief of pain or comfort of fever AVOID taking more than 4000mg   in a 24 hour period (can cause liver damage in higher doses). 2)  If you have chest pain that persists, is more severe, has sweating or shortness breath with it please proceed immediately to the ER.

## 2010-06-07 NOTE — Assessment & Plan Note (Signed)
Summary: PHY   Vital Signs:  Patient profile:   65 year old female Menstrual status:  postmenopausal Height:      62 inches Weight:      146.50 pounds BMI:     26.89 O2 Sat:      94 % Pulse rate:   78 / minute Pulse rhythm:   regular Resp:     16 per minute BP sitting:   130 / 82  (left arm) Cuff size:   regular  Vitals Entered By: Everitt Amber LPN (November 25, 2009 8:06 AM)  Nutrition Counseling: Patient's BMI is greater than 25 and therefore counseled on weight management options. CC: CPE   Vision Screening:Left eye with correction: 20 / 25 Right eye with correction: 20 / 20 Both eyes with correction: 20 / 20  Color vision testing: normal      Vision Entered By: Everitt Amber LPN (November 25, 2009 8:12 AM)   Primary Care Provider:  Syliva Overman MD  CC:  CPE .  History of Present Illness: Reports  that she has been  doing well. Denies recent fever or chills. Denies sinus pressure, nasal congestion , ear pain or sore throat. Denies chest congestion, or cough productive of sputum. Denies chest pain, palpitations, PND, orthopnea or leg swelling. Denies abdominal pain, nausea, vomitting, diarrhea or constipation. Denies change in bowel movements or bloody stool. Denies dysuria , frequency, incontinence or hesitancy. Denies  joint pain, swelling, or reduced mobility. Denies headaches, vertigo, seizures. Denies depression, anxiety or insomnia. Denies  rash, lesions, or itch.     Current Medications (verified): 1)  Omeprazole 20 Mg  Cpdr (Omeprazole) .... One Cap By Mouth Once Daily 2)  Mevacor 40 Mg Tabs (Lovastatin) .... Two Tablets At Bedtime 3)  Lisinopril-Hydrochlorothiazide 20-25 Mg Tabs (Lisinopril-Hydrochlorothiazide) .... Take 1 Tablet By Mouth Once A Day 4)  Tylenol 325 Mg Tabs (Acetaminophen) .... Take As Needed For Pain 5)  Naproxen Sodium 220 Mg Tabs (Naproxen Sodium) .... Take 1 Tab Daily  Allergies (verified): 1)  ! Asa  Review of Systems      See  HPI General:  Complains of fatigue. Eyes:  Denies blurring, discharge, eye pain, and red eye. Endo:  Denies cold intolerance, excessive hunger, excessive thirst, excessive urination, heat intolerance, polyuria, and weight change. Heme:  Denies abnormal bruising and bleeding. Allergy:  Complains of seasonal allergies; denies hives or rash and itching eyes.  Physical Exam  General:  Well-developed,well-nourished,in no acute distress; alert,appropriate and cooperative throughout examination Head:  Normocephalic and atraumatic without obvious abnormalities. No apparent alopecia or balding. Eyes:  No corneal or conjunctival inflammation noted. EOMI. Perrla. Funduscopic exam benign, without hemorrhages, exudates or papilledema. Vision grossly normal. Ears:  External ear exam shows no significant lesions or deformities.  Otoscopic examination reveals clear canals, tympanic membranes are intact bilaterally without bulging, retraction, inflammation or discharge. Hearing is grossly normal bilaterally. Nose:  External nasal examination shows no deformity or inflammation. Nasal mucosa are pink and moist without lesions or exudates. Mouth:  pharynx pink and moist, fair dentition, and teeth missing.   Neck:  No deformities, masses, or tenderness noted. Chest Wall:  No deformities, masses, or tenderness noted. Breasts:  No mass, nodules, thickening, tenderness, bulging, retraction, inflamation, nipple discharge or skin changes noted.   Lungs:  Normal respiratory effort, chest expands symmetrically. Lungs are clear to auscultation, no crackles or wheezes. Heart:  Normal rate and regular rhythm. S1 and S2 normal without gallop, murmur, click, rub or other  extra sounds. Abdomen:  Bowel sounds positive,abdomen soft and non-tender without masses, organomegaly or hernias noted. Rectal:  No external abnormalities noted. Normal sphincter tone. No rectal masses or tenderness. Genitalia:  Normal introitus for age, no  external lesions, no vaginal discharge, mucosa pink and moist, no vaginal or cervical lesions, no vaginal atrophy, no friaility or hemorrhage, normal uterus size and position, no adnexal masses or tenderness Msk:  No deformity or scoliosis noted of thoracic or lumbar spine.   Pulses:  R and L carotid,radial,femoral,dorsalis pedis and posterior tibial pulses are full and equal bilaterally Extremities:  No clubbing, cyanosis, edema, or deformity noted with normal full range of motion of all joints.   Neurologic:  No cranial nerve deficits noted. Station and gait are normal. Plantar reflexes are down-going bilaterally. DTRs are symmetrical throughout. Sensory, motor and coordinative functions appear intact. Skin:  Intact without suspicious lesions or rashes Cervical Nodes:  No lymphadenopathy noted Axillary Nodes:  No palpable lymphadenopathy Inguinal Nodes:  No significant adenopathy Psych:  Cognition and judgment appear intact. Alert and cooperative with normal attention span and concentration. No apparent delusions, illusions, hallucinations   Impression & Recommendations:  Problem # 1:  SPECIAL SCREENING FOR MALIGNANT NEOPLASMS COLON (ICD-V76.51) Assessment Comment Only  Orders: Gastroenterology Referral (GI) Hemoccult Guaiac-1 spec.(in office) (82270)negative, colonscopy due  Problem # 2:  SCREENING FOR MALIGNANT NEOPLASM OF THE CERVIX (ICD-V76.2) Assessment: Comment Only  Orders: Pap Smear (04540)  Problem # 3:  HYPERLIPIDEMIA (ICD-272.4) Assessment: Deteriorated  Her updated medication list for this problem includes:    Mevacor 40 Mg Tabs (Lovastatin) .Marland Kitchen..Marland Kitchen Two tablets at bedtime  Labs Reviewed: SGOT: 22 (10/18/2009)   SGPT: 20 (10/18/2009)   HDL:54 (10/18/2009), 61 (01/28/2009)  LDL:143 (10/18/2009), 135 (01/28/2009)  Chol:213 (10/18/2009), 207 (01/28/2009)  Trig:79 (10/18/2009), 57 (01/28/2009) low fat diet discussed and encouraged, pt also to ensure sdhe takes meds as  prescribed, every night  Problem # 4:  HYPERTENSION (ICD-401.9) Assessment: Unchanged  Her updated medication list for this problem includes:    Lisinopril-hydrochlorothiazide 20-25 Mg Tabs (Lisinopril-hydrochlorothiazide) .Marland Kitchen... Take 1 tablet by mouth once a day  Orders: T-CMP with estimated GFR ([id number])  BP today: 130/82 Prior BP: 104/59 (07/26/2009)  Labs Reviewed: K+: 4.5 (10/18/2009) Creat: : 0.79 (10/18/2009)   Chol: 213 (10/18/2009)   HDL: 54 (10/18/2009)   LDL: 143 (10/18/2009)   TG: 79 (10/18/2009)  Complete Medication List: 1)  Omeprazole 20 Mg Cpdr (Omeprazole) .... One cap by mouth once daily 2)  Mevacor 40 Mg Tabs (Lovastatin) .... Two tablets at bedtime 3)  Lisinopril-hydrochlorothiazide 20-25 Mg Tabs (Lisinopril-hydrochlorothiazide) .... Take 1 tablet by mouth once a day 4)  Tylenol 325 Mg Tabs (Acetaminophen) .... Take as needed for pain 5)  Naproxen Sodium 220 Mg Tabs (Naproxen sodium) .... Take 1 tab daily  Other Orders: T-Lipid Profile ([id number]) Radiology Referral (Radiology) Radiology Referral (Radiology)  Patient Instructions: 1)  F/u in 5.5 months 2)  you will be referred for a colonscopy, mamogram and dexa. 3)  pls cut back on fried and fatty foods, also oils and cheese. 4)  continue to exercise every day fopr at leasrt 40 minurtes. 5)  pls take calcwith D 1200mg /1000iU daily. 6)  continue one multivitamin daily 7)  Lipid Panel prior to visit, ICD-9:  fasting lipid, and bMP with egFR in 6 months  Laboratory Results    Stool - Occult Blood Hemmoccult #1: negative Date: 11/25/2009 Comments: [id number] 9R 8/11 118 10/12       Laboratory  Results    Stool - Occult Blood Hemmoccult #1: negative Comments: 51180 9R 8/11 118 10/12

## 2010-06-07 NOTE — Progress Notes (Signed)
Summary: COUGH  Phone Note Call from Patient   Summary of Call: STILL HAS A COUGH AND HAD A HARDD SNEEZE AND FEELS LIKE SHE PULLED SOMETHING IN CHEST WEEK OF THE 2ND CALL HER BACK Initial call taken by: Lind Guest,  May 31, 2009 8:59 AM  Follow-up for Phone Call        if coughing up sputum need to submit c/s also if excess cough order CXR, if alot of sneezing likely allergies mainly.pls get more details and let me know anything else I need to Follow-up by: Syliva Overman MD,  May 31, 2009 6:05 PM  Additional Follow-up for Phone Call Additional follow up Details #1::        RETURNED CALL, LEFT MESSAGE Additional Follow-up by: Worthy Keeler LPN,  June 01, 2009 2:45 PM    Additional Follow-up for Phone Call Additional follow up Details #2::    no fever, no chills, no body aches no green sputum just has nagging cough that hurts her chest Follow-up by: Worthy Keeler LPN,  June 02, 2009 9:52 AM  Additional Follow-up for Phone Call Additional follow up Details #3:: Details for Additional Follow-up Action Taken: ERS TUSSIONEX 5CC TWICE DAILY AS NEEDED X 300CC IF NOT ALLERGIC TO CODEUINE, LET HE KNOW, IF SHWE WANTS CXR AND HASN'T HAD ONE IN PAST 4 MTHS OK TO ORDER THIS ALSO Additional Follow-up by: Syliva Overman MD,  June 02, 2009 12:18 PM  Prescriptions: [name] ER 8-10 MG/5ML LQCR (CHLORPHENIRAMINE-HYDROCODONE) one teaspoon twice daily as needed for cough  #300 x 0   Entered by:   Worthy Keeler LPN   Authorized by:   Syliva Overman MD   Signed by:   Worthy Keeler LPN on 16/02/9603   Method used:   Printed then faxed to ...       [facility]  [address] [phone]* (retail)       [address]       [place], [place]  [address]       Ph: [phone] or [phone]       Fax: [phone]   RxID:   [id number]  rx sent and patient aware

## 2010-06-07 NOTE — Letter (Signed)
Summary: external records  external records   Imported By: Curtis Sites 12/28/2009 08:39:35  _____________________________________________________________________  External Attachment:    Type:   Image     Comment:   External Document

## 2010-06-07 NOTE — Progress Notes (Signed)
Summary: referral  Phone Note Call from Patient   Summary of Call: pt has skin lesion and wants to see dermatologist. is this okay [phone] Initial call taken by: Rudene Anda,  August 11, 2009 8:40 AM  Follow-up for Phone Call        I al;ready said yes, [pls read msg from 04/04 Follow-up by: Syliva Overman MD,  August 11, 2009 1:15 PM  Additional Follow-up for Phone Call Additional follow up Details #1::        pt has appt at dr. halls office for 08/18/2009 8:30. left message for pt. Additional Follow-up by: Rudene Anda,  August 11, 2009 1:40 PM

## 2010-06-07 NOTE — Letter (Signed)
Summary: EKG  EKG   Imported By: Faythe Ghee 07/14/2009 13:57:21  _____________________________________________________________________  External Attachment:    Type:   Image     Comment:   External Document

## 2010-06-07 NOTE — Miscellaneous (Signed)
Summary: labs bmp,lipids,01/28/2009  Clinical Lists Changes  Observations: Added new observation of CALCIUM: 9.4 mg/dL (16/02/9603 54:09) Added new observation of ALBUMIN: 4.4 g/dL (81/19/1478 29:56) Added new observation of PROTEIN, TOT: 7.5 g/dL (21/30/8657 84:69) Added new observation of SGPT (ALT): 16 units/L (01/28/2009 14:03) Added new observation of SGOT (AST): 22 units/L (01/28/2009 14:03) Added new observation of ALK PHOS: 90 units/L (01/28/2009 14:03) Added new observation of BILI DIRECT: 0.1 mg/dL (62/95/2841 32:44) Added new observation of CREATININE: 0.82 mg/dL (05/10/7251 66:44) Added new observation of BUN: 16 mg/dL (03/47/4259 56:38) Added new observation of BG RANDOM: 105 mg/dL (75/64/3329 51:88) Added new observation of CO2 PLSM/SER: 23 meq/L (01/28/2009 14:03) Added new observation of CL SERUM: 104 meq/L (01/28/2009 14:03) Added new observation of K SERUM: 4.0 meq/L (01/28/2009 14:03) Added new observation of NA: 140 meq/L (01/28/2009 14:03) Added new observation of LDL: 135 mg/dL (41/66/0630 16:01) Added new observation of HDL: 61 mg/dL (09/32/3557 32:20) Added new observation of TRIGLYC TOT: 57 mg/dL (25/42/7062 37:62) Added new observation of CHOLESTEROL: 207 mg/dL (83/15/1761 60:73) Added new observation of TSH: 1.921 microintl units/mL (01/28/2009 14:03)

## 2010-06-09 NOTE — Progress Notes (Signed)
Summary: lab  Phone Note Call from Patient   Summary of Call: going to the lab friday  morning wants to know will you fax over the paper is it for the lipid and t-cmp if so i can fax over just let me know Initial call taken by: Lind Guest,  May 11, 2010 12:50 PM

## 2010-06-09 NOTE — Assessment & Plan Note (Signed)
Summary: F UP   Vital Signs:  Patient profile:   65 year old female Menstrual status:  postmenopausal Height:      62 inches Weight:      143.50 pounds BMI:     26.34 O2 Sat:      95 % Pulse rate:   71 / minute Pulse rhythm:   regular Resp:     16 per minute BP sitting:   122 / 74  (left arm) Cuff size:   regular  Vitals Entered By: Everitt Amber LPN (May 18, 2010 3:10 PM)  Nutrition Counseling: Patient's BMI is greater than 25 and therefore counseled on weight management options. CC: Follow up chronic problems   Primary Care Provider:  Syliva Overman MD  CC:  Follow up chronic problems.  History of Present Illness: Reports  thatshe has been  doing well. Denies recent fever or chills. Denies sinus pressure, nasal congestion , ear pain or sore throat. Denies chest congestion, or cough productive of sputum. Denies chest pain, palpitations, PND, orthopnea or leg swelling. Denies abdominal pain, nausea, vomitting, diarrhea or constipation. Denies change in bowel movements or bloody stool. Denies dysuria , frequency, incontinence or hesitancy. Denies  joint pain, swelling, or reduced mobility. Denies headaches, vertigo, seizures. Denies depression, anxiety or insomnia. Denies  rash, lesions, or itch.     Current Medications (verified): 1)  Omeprazole 20 Mg  Cpdr (Omeprazole) .... One Cap By Mouth Once Daily 2)  Lisinopril-Hydrochlorothiazide 20-25 Mg Tabs (Lisinopril-Hydrochlorothiazide) .... Take 1 Tablet By Mouth Once A Day 3)  Tylenol 325 Mg Tabs (Acetaminophen) .... Take As Needed For Pain 4)  Naproxen Sodium 220 Mg Tabs (Naproxen Sodium) .... Take 1 Tab Daily 5)  Miacalcin 200 Unit/ml Soln (Calcitonin (Salmon)) .... 200 Units Once Daily , Alternate Nostrils(1 Spray Total Each Day) 6)  Multivitamins  Tabs (Multiple Vitamin) .... Take 1 Tablet By Mouth Once A Day 7)  Lumigan 0.01 % Soln (Bimatoprost) .... One Drop in Both Eyes At Bedtime 8)  [facility] Colon Health   Caps (Probiotic Product) .... One Tab Once Daily  Allergies (verified): 1)  ! Asa  Review of Systems      See HPI General:  Denies fatigue. Eyes:  Denies blurring, discharge, eye pain, and red eye. Endo:  Denies cold intolerance, excessive hunger, excessive thirst, and excessive urination. Heme:  Denies abnormal bruising, bleeding, enlarge lymph nodes, and fevers. Allergy:  Complains of seasonal allergies; asymptomatic at this time.  Physical Exam  General:  Well-developed,well-nourished,in no acute distress; alert,appropria HEENT: No facial asymmetry,  EOMI, No sinus tenderness, TM's Clear, oropharynx  pink and moist.   Chest: Clear to auscultation bilaterally.  CVS: S1, S2, No murmurs, No S3.   Abd: Soft, Nontender.  MS: Adequate ROM spine, hips, shoulders and knees.  Ext: No edema.   CNS: CN 2-12 intact, power tone and sensation normal throughout.   Skin: Intact, no visible lesions or rashes.  Psych: Good eye contact, normal affect.  Memory intact, not anxious or depressed appearing.    Impression & Recommendations:  Problem # 1:  HYPERTENSION (ICD-401.9) Assessment Unchanged  Her updated medication list for this problem includes:    Lisinopril-hydrochlorothiazide 20-25 Mg Tabs (Lisinopril-hydrochlorothiazide) .Marland Kitchen... Take 1 tablet by mouth once a day  BP today: 122/74 Prior BP: 130/82 (11/25/2009)  Labs Reviewed: K+: 4.3 (05/13/2010) Creat: : 0.87 (05/13/2010)   Chol: 250 (05/13/2010)   HDL: 51 (05/13/2010)   LDL: 184 (05/13/2010)   TG: 73 (05/13/2010)  Problem #  2:  HYPERLIPIDEMIA (ICD-272.4) Assessment: Deteriorated  The following medications were removed from the medication list:    Mevacor 40 Mg Tabs (Lovastatin) .Marland Kitchen..Marland Kitchen Two tablets at bedtime Her updated medication list for this problem includes:    Lovastatin 40 Mg Tabs (Lovastatin) .Marland Kitchen..Marland Kitchen Two tablets at bedtime  Orders: T-Hepatic Function [phone]) T-Lipid Profile [phone])  Labs Reviewed: SGOT:  23 (05/13/2010)   SGPT: 19 (05/13/2010)   HDL:51 (05/13/2010), 54 (10/18/2009)  LDL:184 (05/13/2010), 143 (10/18/2009)  Chol:250 (05/13/2010), 213 (10/18/2009)  Trig:73 (05/13/2010), 79 (10/18/2009)  Problem # 3:  GERD (ICD-530.81) Assessment: Improved  Her updated medication list for this problem includes:    Omeprazole 20 Mg Cpdr (Omeprazole) ..... One cap by mouth once daily  Complete Medication List: 1)  Omeprazole 20 Mg Cpdr (Omeprazole) .... One cap by mouth once daily 2)  Lisinopril-hydrochlorothiazide 20-25 Mg Tabs (Lisinopril-hydrochlorothiazide) .... Take 1 tablet by mouth once a day 3)  Tylenol 325 Mg Tabs (Acetaminophen) .... Take as needed for pain 4)  Naproxen Sodium 220 Mg Tabs (Naproxen sodium) .... Take 1 tab daily 5)  Miacalcin 200 Unit/ml Soln (Calcitonin (salmon)) .... 200 units once daily , alternate nostrils(1 spray total each day) 6)  Multivitamins Tabs (Multiple vitamin) .... Take 1 tablet by mouth once a day 7)  Lumigan 0.01 % Soln (Bimatoprost) .... One drop in both eyes at bedtime 8)  [facility] Colon Health Caps (Probiotic product) .... One tab once daily 9)  Lovastatin 40 Mg Tabs (Lovastatin) .... Two tablets at bedtime  Other Orders: T-Basic Metabolic Panel [phone]) T-TSH [phone])  Patient Instructions: 1)  cPE juuly 29 or after. 2)  you need to resume the cholesterol med, lovastatin/mevacor, your cholesterol is too high. 3)  pls follow a low fat  4)  BMP prior to visit, ICD-9: 5)  Hepatic Panel prior to visit, ICD-9:   fasting in 4 months 6)  Lipid Panel prior to visit, ICD-9: 7)  TSH prior to visit, ICD-9: Prescriptions: LOVASTATIN 40 MG TABS (LOVASTATIN) two tablets at bedtime  #60 x 3   Entered and Authorized by:   Syliva Overman MD   Signed by:   Syliva Overman MD on 05/18/2010   Method used:   [facility]        [facility] [phone][facility] (retail)       [address]       [place], [place]  [address]       Ph:  [phone]       Fax: [phone]   RxID:   [id number]    Orders Added: 1)  Est. Patient Level IV [94854] 2)  T-Basic Metabolic Panel [[id number]] 3)  T-Hepatic Function [[id number]] 4)  T-Lipid Profile [[id number]] 5)  T-TSH [[id number]]

## 2010-06-10 NOTE — Letter (Signed)
Summary: Internal Other/triage/instructions  Internal Other/triage/instructions   Imported By: Cloria Spring LPN 78/29/5621 30:86:57  _____________________________________________________________________  External Attachment:    Type:   Image     Comment:   External Document

## 2010-08-31 ENCOUNTER — Telehealth: Payer: Self-pay | Admitting: Family Medicine

## 2010-09-01 ENCOUNTER — Other Ambulatory Visit: Payer: Self-pay | Admitting: Family Medicine

## 2010-09-01 LAB — BASIC METABOLIC PANEL
BUN: 14 mg/dL (ref 6–23)
CO2: 27 mEq/L (ref 19–32)
Glucose, Bld: 99 mg/dL (ref 70–99)
Potassium: 4.4 mEq/L (ref 3.5–5.3)
Sodium: 141 mEq/L (ref 135–145)

## 2010-09-01 LAB — HEPATIC FUNCTION PANEL
ALT: 23 U/L (ref 0–35)
AST: 23 U/L (ref 0–37)
Alkaline Phosphatase: 102 U/L (ref 39–117)
Indirect Bilirubin: 0.4 mg/dL (ref 0.0–0.9)
Total Protein: 7.5 g/dL (ref 6.0–8.3)

## 2010-09-01 LAB — LIPID PANEL
Cholesterol: 206 mg/dL — ABNORMAL HIGH (ref 0–200)
HDL: 48 mg/dL (ref >39)
LDL Cholesterol: 143 mg/dL — ABNORMAL HIGH (ref 0–99)
Triglycerides: 73 mg/dL (ref <150)

## 2010-09-01 NOTE — Telephone Encounter (Signed)
Patient did not have question about blood work, she requested blood work order, she went to lab this morning

## 2010-09-23 NOTE — H&P (Signed)
NAME:  Colleen [name], Colleen [name]                 ACCOUNT NO.:  [id number]   MEDICAL RECORD NO.:  [id number]          PATIENT TYPE:  AMB   LOCATION:  DAY                           FACILITY:  [facility]   PHYSICIAN:  Dalia Heading, M.D.  DATE OF BIRTH:  [birth year]   DATE OF ADMISSION:  DATE OF DISCHARGE:  LH                                HISTORY & PHYSICAL   CHIEF COMPLAINT:  GERD.   HISTORY OF PRESENT ILLNESS:  Patient is a 65 year old black female who was  referred for funduscopic evaluation.  She needs an EGD for intractable GERD.  It has been present for some time.  It is made worse with lying down.  No  dysphagia, weight loss, nausea, vomiting, diarrhea, constipation, melena, or  hematochezia has been noted.  She last had a colonoscopy four years ago,  which was reportedly within normal limits.   PAST MEDICAL HISTORY:  Hypertension.   PAST SURGICAL HISTORY:  Unremarkable.   CURRENT MEDICATIONS:  Hydrochlorothiazide, Crestor, Altace, Boniva.   ALLERGIES:  No known drug allergies.   REVIEW OF SYSTEMS:  Noncontributory.   PHYSICAL EXAMINATION:  GENERAL:  Patient is a well-developed and well-  nourished black female in no acute distress.  LUNGS:  Clear to auscultation with equal breath sounds bilaterally.  HEART:  Regular rate and rhythm without S3, S4, or murmurs.  ABDOMEN:  Soft, nontender, nondistended.  No hepatosplenomegaly or masses  are noted.   IMPRESSION:  Intractable gastroesophageal reflux disease.   PLAN:  Patient is scheduled for an EGD on December 19, 2005.  The risks and  benefits of the procedure, including bleeding and perforation were fully  explained to the patient, who gave informed consent.      Dalia Heading, M.D.  Electronically Signed     MAJ/MEDQ  D:  11/30/2005  T:  11/30/2005  Job:  [id number]   cc:   [facility]  Fax: [phone]   Milus Mallick. Lodema Hong, M.D.  Fax: [phone]

## 2010-10-24 ENCOUNTER — Other Ambulatory Visit: Payer: Self-pay | Admitting: Family Medicine

## 2010-11-28 ENCOUNTER — Other Ambulatory Visit: Payer: Self-pay | Admitting: Family Medicine

## 2010-11-28 DIAGNOSIS — Z139 Encounter for screening, unspecified: Secondary | ICD-10-CM

## 2010-11-30 ENCOUNTER — Other Ambulatory Visit (HOSPITAL_COMMUNITY)
Admission: RE | Admit: 2010-11-30 | Discharge: 2010-11-30 | Disposition: A | Discharge status: Home / Self Care | Payer: 59 | Source: Ambulatory Visit | Attending: Family Medicine | Admitting: Family Medicine

## 2010-11-30 ENCOUNTER — Encounter: Payer: Self-pay | Admitting: Family Medicine

## 2010-11-30 ENCOUNTER — Ambulatory Visit (INDEPENDENT_AMBULATORY_CARE_PROVIDER_SITE_OTHER): Payer: 59 | Admitting: Family Medicine

## 2010-11-30 VITALS — BP 124/82 | HR 76 | Resp 16 | Ht 61.5 in | Wt 145.1 lb

## 2010-11-30 DIAGNOSIS — Z01419 Encounter for gynecological examination (general) (routine) without abnormal findings: Secondary | ICD-10-CM | POA: Insufficient documentation

## 2010-11-30 DIAGNOSIS — I10 Essential (primary) hypertension: Secondary | ICD-10-CM

## 2010-11-30 DIAGNOSIS — K219 Gastro-esophageal reflux disease without esophagitis: Secondary | ICD-10-CM

## 2010-11-30 DIAGNOSIS — Z0000 Encounter for general adult medical examination without abnormal findings: Secondary | ICD-10-CM

## 2010-11-30 DIAGNOSIS — E785 Hyperlipidemia, unspecified: Secondary | ICD-10-CM

## 2010-11-30 DIAGNOSIS — Z124 Encounter for screening for malignant neoplasm of cervix: Secondary | ICD-10-CM

## 2010-11-30 DIAGNOSIS — Z0100 Encounter for examination of eyes and vision without abnormal findings: Secondary | ICD-10-CM

## 2010-11-30 DIAGNOSIS — Z1211 Encounter for screening for malignant neoplasm of colon: Secondary | ICD-10-CM

## 2010-11-30 LAB — BASIC METABOLIC PANEL
CO2: 26 mEq/L (ref 19–32)
Chloride: 104 mEq/L (ref 96–112)
Glucose, Bld: 97 mg/dL (ref 70–99)
Potassium: 4.4 mEq/L (ref 3.5–5.3)
Sodium: 140 mEq/L (ref 135–145)

## 2010-11-30 LAB — LIPID PANEL
LDL Cholesterol: 176 mg/dL — ABNORMAL HIGH (ref 0–99)
Total CHOL/HDL Ratio: 4.8 Ratio
VLDL: 12 mg/dL (ref 0–40)

## 2010-11-30 LAB — HEPATIC FUNCTION PANEL
Albumin: 4.4 g/dL (ref 3.5–5.2)
Bilirubin, Direct: 0.1 mg/dL (ref 0.0–0.3)
Total Bilirubin: 0.5 mg/dL (ref 0.3–1.2)

## 2010-11-30 MED ORDER — OMEPRAZOLE 20 MG PO CPDR: 20.0000 mg | DELAYED_RELEASE_CAPSULE | Freq: Every day | ORAL | Status: DC

## 2010-11-30 MED ORDER — LISINOPRIL-HYDROCHLOROTHIAZIDE 20-25 MG PO TABS: 1.0000 | ORAL_TABLET | Freq: Every day | ORAL | Status: DC

## 2010-11-30 NOTE — Patient Instructions (Addendum)
F/U in  mid January.  pls call in sept for your flu vaccine.   Lipid, hepatic, chem 7  Fasting today  Pls keep appt for mammogram  pls call dr Melene Muller for office adress

## 2010-12-05 ENCOUNTER — Other Ambulatory Visit: Payer: Self-pay | Admitting: *Deleted

## 2010-12-05 ENCOUNTER — Ambulatory Visit (HOSPITAL_COMMUNITY)
Admission: RE | Admit: 2010-12-05 | Discharge: 2010-12-05 | Disposition: A | Discharge status: Home / Self Care | Payer: 59 | Source: Ambulatory Visit | Attending: Family Medicine | Admitting: Family Medicine

## 2010-12-05 DIAGNOSIS — Z1231 Encounter for screening mammogram for malignant neoplasm of breast: Secondary | ICD-10-CM | POA: Insufficient documentation

## 2010-12-05 DIAGNOSIS — Z139 Encounter for screening, unspecified: Secondary | ICD-10-CM

## 2010-12-05 MED ORDER — LOVASTATIN 40 MG PO TABS: ORAL_TABLET | ORAL | Status: DC

## 2010-12-05 NOTE — Progress Notes (Signed)
New med dose sent patient aware

## 2010-12-08 NOTE — Assessment & Plan Note (Signed)
Controlled, no change in medication  

## 2010-12-08 NOTE — Progress Notes (Signed)
  Subjective:    Patient ID: Colleen [name], female    DOB: [birth year], 65 y.o.   MRN: [id number]  HPI The PT is here for annual exam and re-evaluation of chronic medical conditions, medication management and review of any available recent lab and radiology data.  Preventive health is updated, specifically  Cancer screening and Immunization.   Questions or concerns regarding consultations or procedures which the PT has had in the interim are  addressed. The PT denies any adverse reactions to current medications since the last visit.  There are no new concerns.  There are no specific complaints       Review of Systems Denies recent fever or chills. Denies sinus pressure, nasal congestion, ear pain or sore throat. Denies chest congestion, productive cough or wheezing. Denies chest pains, palpitations and leg swelling Denies abdominal pain, nausea, vomiting,diarrhea or constipation.   Denies dysuria, frequency, hesitancy or incontinence. Denies joint pain, swelling and limitation in mobility. Denies headaches, seizures, numbness, or tingling. Denies depression, anxiety or insomnia. Denies skin break down or rash.        Objective:   Physical Exam Pleasant well nourished female, alert and oriented x 3, in no cardio-pulmonary distress. Afebrile. HEENT No facial trauma or asymetry. Sinuses non tender.  EOMI, PERTL, fundoscopic exam is normal, no hemorhage or exudate.  External ears normal, tympanic membranes clear. Oropharynx moist, no exudate,fairly  good dentition. Neck: supple, no adenopathy,JVD or thyromegaly.No bruits.  Chest: Clear to ascultation bilaterally.No crackles or wheezes. Non tender to palpation  Breast: No asymetry,no masses. No nipple discharge or inversion. No axillary or supraclavicular adenopathy  Cardiovascular system; Heart sounds normal,  S1 and  S2 ,no S3.  No murmur, or thrill. Apical beat not displaced Peripheral pulses  normal.  Abdomen: Soft, non tender, no organomegaly or masses. No bruits. Bowel sounds normal. No guarding, tenderness or rebound.  Rectal:  No mass. Guaiac negative stool.  GU: External genitalia normal. No lesions. Vaginal canal normal.No discharge. Uterus normal size, no adnexal masses, no cervical motion or adnexal tenderness.  Musculoskeletal exam: Full ROM of spine, hips , shoulders and knees. No deformity ,swelling or crepitus noted. No muscle wasting or atrophy.   Neurologic: Cranial nerves 2 to 12 intact. Power, tone ,sensation and reflexes normal throughout. No disturbance in gait. No tremor.  Skin: Intact, no ulceration, erythema , scaling or rash noted. Pigmentation normal throughout  Psych; Normal mood and affect. Judgement and concentration normal        Assessment & Plan:

## 2011-02-07 LAB — URINE MICROSCOPIC-ADD ON

## 2011-02-07 LAB — URINE CULTURE: Colony Count: 4000

## 2011-02-07 LAB — URINALYSIS, ROUTINE W REFLEX MICROSCOPIC
Bilirubin Urine: NEGATIVE
Glucose, UA: NEGATIVE mg/dL
Ketones, ur: NEGATIVE mg/dL
pH: 6 (ref 5.0–8.0)

## 2011-03-08 ENCOUNTER — Ambulatory Visit (INDEPENDENT_AMBULATORY_CARE_PROVIDER_SITE_OTHER): Payer: 59

## 2011-03-08 DIAGNOSIS — Z23 Encounter for immunization: Secondary | ICD-10-CM

## 2011-04-03 ENCOUNTER — Telehealth: Payer: Self-pay | Admitting: Family Medicine

## 2011-04-14 NOTE — Telephone Encounter (Signed)
Wants to know if she can go back to her old dose of Lovastatin (40mg ). You increased her to 2 tabs (80mg ) and she said its too much for her and is upsetting her stomach.

## 2011-04-14 NOTE — Telephone Encounter (Signed)
Called patient no answer. Left message.

## 2011-04-14 NOTE — Telephone Encounter (Signed)
Advise if not tolerating two, take one , try for two two days per week if she can tolerate, if not just one, follow low fat diet and I will see what lipids look like at next check

## 2011-04-17 ENCOUNTER — Other Ambulatory Visit: Payer: Self-pay | Admitting: Family Medicine

## 2011-04-17 MED ORDER — PRAVASTATIN SODIUM 20 MG PO TABS: 20.0000 mg | ORAL_TABLET | Freq: Every evening | ORAL | Status: DC

## 2011-04-17 NOTE — Telephone Encounter (Signed)
pravstatin 20 mg is sent in it's place, pleas let herknow

## 2011-04-17 NOTE — Telephone Encounter (Signed)
She said she absolutely cannot tolerate the 2 tabs and even the 1 tab at 40 mg is upsetting her stomach. Wants to see if you can change the med all together since she is having problems with it.

## 2011-04-17 NOTE — Telephone Encounter (Signed)
Patient aware.

## 2011-05-31 ENCOUNTER — Other Ambulatory Visit: Payer: Self-pay | Admitting: Family Medicine

## 2011-05-31 ENCOUNTER — Encounter: Payer: Self-pay | Admitting: Family Medicine

## 2011-06-01 LAB — BASIC METABOLIC PANEL
BUN: 14 mg/dL (ref 6–23)
CO2: 26 mEq/L (ref 19–32)
Glucose, Bld: 96 mg/dL (ref 70–99)
Potassium: 4.5 mEq/L (ref 3.5–5.3)

## 2011-06-01 LAB — LIPID PANEL
Cholesterol: 210 mg/dL — ABNORMAL HIGH (ref 0–200)
VLDL: 16 mg/dL (ref 0–40)

## 2011-06-01 LAB — HEPATIC FUNCTION PANEL
ALT: 16 U/L (ref 0–35)
Bilirubin, Direct: 0.1 mg/dL (ref 0.0–0.3)
Indirect Bilirubin: 0.5 mg/dL (ref 0.0–0.9)
Total Bilirubin: 0.6 mg/dL (ref 0.3–1.2)

## 2011-06-02 ENCOUNTER — Ambulatory Visit (INDEPENDENT_AMBULATORY_CARE_PROVIDER_SITE_OTHER): Payer: 59 | Admitting: Family Medicine

## 2011-06-02 ENCOUNTER — Encounter: Payer: Self-pay | Admitting: Family Medicine

## 2011-06-02 VITALS — BP 132/80 | HR 69 | Resp 15 | Ht 61.5 in | Wt 145.1 lb

## 2011-06-02 DIAGNOSIS — R5383 Other fatigue: Secondary | ICD-10-CM

## 2011-06-02 DIAGNOSIS — M899 Disorder of bone, unspecified: Secondary | ICD-10-CM

## 2011-06-02 DIAGNOSIS — E785 Hyperlipidemia, unspecified: Secondary | ICD-10-CM

## 2011-06-02 DIAGNOSIS — R1013 Epigastric pain: Secondary | ICD-10-CM

## 2011-06-02 DIAGNOSIS — K3189 Other diseases of stomach and duodenum: Secondary | ICD-10-CM

## 2011-06-02 DIAGNOSIS — I10 Essential (primary) hypertension: Secondary | ICD-10-CM

## 2011-06-02 DIAGNOSIS — K219 Gastro-esophageal reflux disease without esophagitis: Secondary | ICD-10-CM

## 2011-06-02 DIAGNOSIS — M810 Age-related osteoporosis without current pathological fracture: Secondary | ICD-10-CM

## 2011-06-02 DIAGNOSIS — R5381 Other malaise: Secondary | ICD-10-CM

## 2011-06-02 NOTE — Assessment & Plan Note (Signed)
tolerating nasal spray

## 2011-06-02 NOTE — Assessment & Plan Note (Signed)
Reports heartburn with med med, will check h pylori next draw

## 2011-06-02 NOTE — Assessment & Plan Note (Signed)
Controlled, no change in medication  

## 2011-06-02 NOTE — Assessment & Plan Note (Signed)
Uncontrolled, increase to daily medication at breakfast

## 2011-06-02 NOTE — Patient Instructions (Signed)
cPE July 28 or after  Fasting lipid, cmp, tSH, vit d, hepatic , tsh and h pylori in 5 months, end of June   Continue healthy eating habits and commit to exercise at least 5 days per week for 30 mins each day  Please take your cholesterol medicine every day, at breakfast or lunch, your cholesterol is too high  Keep well, call if you need me before

## 2011-06-03 NOTE — Progress Notes (Signed)
  Subjective:    Patient ID: Colleen [name], female    DOB: 03-07-1946, 66 y.o.   MRN: [id number]  HPI The PT is here for follow up and re-evaluation of chronic medical conditions, medication management and review of any available recent lab and radiology data.  Preventive health is updated, specifically  Cancer screening and Immunization.   Questions or concerns regarding consultations or procedures which the PT has had in the interim are  addressed. The PT denies any adverse reactions to current medications since the last visit.  There are no new concerns.  There are no specific complaints       Review of Systems    See HPI Denies recent fever or chills. Denies sinus pressure, nasal congestion, ear pain or sore throat. Denies chest congestion, productive cough or wheezing. Denies chest pains, palpitations and leg swelling Denies abdominal pain, nausea, vomiting,diarrhea or constipation.   Denies dysuria, frequency, hesitancy or incontinence. Denies joint pain, swelling and limitation in mobility. Denies headaches, seizures, numbness, or tingling. Denies depression, anxiety or insomnia. Denies skin break down or rash.     Objective:   Physical Exam Patient alert and oriented and in no cardiopulmonary distress.  HEENT: No facial asymmetry, EOMI, no sinus tenderness,  oropharynx pink and moist.  Neck supple no adenopathy.  Chest: Clear to auscultation bilaterally.  CVS: S1, S2 no murmurs, no S3.  ABD: Soft non tender. Bowel sounds normal.  Ext: No edema  MS: Adequate ROM spine, shoulders, hips and knees.  Skin: Intact, no ulcerations or rash noted.  Psych: Good eye contact, normal affect. Memory intact not anxious or depressed appearing.  CNS: CN 2-12 intact, power, tone and sensation normal throughout.        Assessment & Plan:

## 2011-06-27 ENCOUNTER — Other Ambulatory Visit: Payer: Self-pay | Admitting: Family Medicine

## 2011-08-19 ENCOUNTER — Other Ambulatory Visit: Payer: Self-pay | Admitting: Family Medicine

## 2011-11-02 ENCOUNTER — Other Ambulatory Visit: Payer: Self-pay | Admitting: Family Medicine

## 2011-11-03 LAB — H. PYLORI ANTIBODY, IGG: H Pylori IgG: <0.4 {ISR}

## 2011-11-03 LAB — LIPID PANEL
HDL: 52 mg/dL (ref >39)
LDL Cholesterol: 111 mg/dL — ABNORMAL HIGH (ref 0–99)
Total CHOL/HDL Ratio: 3.4 Ratio
Triglycerides: 60 mg/dL (ref <150)
VLDL: 12 mg/dL (ref 0–40)

## 2011-11-03 LAB — BASIC METABOLIC PANEL WITH GFR
CO2: 26 mEq/L (ref 19–32)
Calcium: 9.5 mg/dL (ref 8.4–10.5)
Chloride: 108 mEq/L (ref 96–112)
Creat: 0.83 mg/dL (ref 0.50–1.10)
Glucose, Bld: 96 mg/dL (ref 70–99)

## 2011-11-03 LAB — HEPATIC FUNCTION PANEL
ALT: 20 U/L (ref 0–35)
AST: 23 U/L (ref 0–37)
Albumin: 4.3 g/dL (ref 3.5–5.2)
Alkaline Phosphatase: 103 U/L (ref 39–117)
Total Protein: 7 g/dL (ref 6.0–8.3)

## 2011-12-04 ENCOUNTER — Encounter: Payer: Self-pay | Admitting: Family Medicine

## 2011-12-04 ENCOUNTER — Other Ambulatory Visit: Payer: Self-pay | Admitting: Family Medicine

## 2011-12-04 ENCOUNTER — Ambulatory Visit (INDEPENDENT_AMBULATORY_CARE_PROVIDER_SITE_OTHER): Payer: 59 | Admitting: Family Medicine

## 2011-12-04 ENCOUNTER — Other Ambulatory Visit (HOSPITAL_COMMUNITY)
Admission: RE | Admit: 2011-12-04 | Discharge: 2011-12-04 | Disposition: A | Discharge status: Home / Self Care | Payer: 59 | Source: Ambulatory Visit | Attending: Family Medicine | Admitting: Family Medicine

## 2011-12-04 VITALS — BP 120/80 | HR 75 | Resp 16 | Ht 61.5 in | Wt 143.1 lb

## 2011-12-04 DIAGNOSIS — I10 Essential (primary) hypertension: Secondary | ICD-10-CM

## 2011-12-04 DIAGNOSIS — M25569 Pain in unspecified knee: Secondary | ICD-10-CM

## 2011-12-04 DIAGNOSIS — Z0000 Encounter for general adult medical examination without abnormal findings: Secondary | ICD-10-CM

## 2011-12-04 DIAGNOSIS — Z139 Encounter for screening, unspecified: Secondary | ICD-10-CM

## 2011-12-04 DIAGNOSIS — Z124 Encounter for screening for malignant neoplasm of cervix: Secondary | ICD-10-CM

## 2011-12-04 DIAGNOSIS — M25562 Pain in left knee: Secondary | ICD-10-CM | POA: Insufficient documentation

## 2011-12-04 DIAGNOSIS — Z1211 Encounter for screening for malignant neoplasm of colon: Secondary | ICD-10-CM

## 2011-12-04 DIAGNOSIS — E785 Hyperlipidemia, unspecified: Secondary | ICD-10-CM

## 2011-12-04 DIAGNOSIS — Z01419 Encounter for gynecological examination (general) (routine) without abnormal findings: Secondary | ICD-10-CM | POA: Insufficient documentation

## 2011-12-04 DIAGNOSIS — K219 Gastro-esophageal reflux disease without esophagitis: Secondary | ICD-10-CM

## 2011-12-04 MED ORDER — IBUPROFEN 800 MG PO TABS: 800.0000 mg | ORAL_TABLET | Freq: Three times a day (TID) | ORAL | Status: AC | PRN

## 2011-12-04 MED ORDER — KETOROLAC TROMETHAMINE 60 MG/2ML IM SOLN
60.0000 mg | Freq: Once | INTRAMUSCULAR | Status: AC
  Administered 2011-12-04: 60 mg via INTRAMUSCULAR

## 2011-12-04 MED ORDER — PREDNISONE (PAK) 5 MG PO TABS: 5.0000 mg | ORAL_TABLET | ORAL | Status: DC

## 2011-12-04 MED ORDER — METHYLPREDNISOLONE ACETATE 80 MG/ML IJ SUSP
80.0000 mg | Freq: Once | INTRAMUSCULAR | Status: AC
  Administered 2011-12-04: 80 mg via INTRAMUSCULAR

## 2011-12-04 NOTE — Patient Instructions (Addendum)
Welcome to medicare visit in January 2014  You will get toradol and depo medrol in office today for left knee pain and medication is also sent to your pharmacy. If not improved in 2 weeks, call for ortho referral please  Cholesterol much improved, but bad cholesterol is still high, no change in medication, just cut back on fried and fatty foods.  You need to take calcium with D 1200mg /1000IU one daily , ok to finiosh taking the D3 you have along with this, take one daily  Mammogram will be scheduled at checkout   Call for flu and pneumonia vaccines in October please  You will get mucinex, take one tablet once daily for 6 days. Flush nostriils twice daily with saline. Continue allergy tablets.  It is important that you exercise regularly at least 30 minutes 5 times a week. If you develop chest pain, have severe difficulty breathing, or feel very tired, stop exercising immediately and seek medical attention  A healthy diet is rich in fruit, vegetables and whole grains. Poultry fish, nuts and beans are a healthy choice for protein rather then red meat. A low sodium diet and drinking 64 ounces of water daily is generally recommended. Oils and sweet should be limited. Carbohydrates especially for those who are diabetic or overweight, should be limited to 30-45 gram per meal. It is important to eat on a regular schedule, at least 3 times daily. Snacks should be primarily fruits, vegetables or nuts.

## 2011-12-05 DIAGNOSIS — Z0000 Encounter for general adult medical examination without abnormal findings: Secondary | ICD-10-CM | POA: Insufficient documentation

## 2011-12-05 NOTE — Assessment & Plan Note (Signed)
Annual exam completed. Counseling as far as healthy lifestyle specifically for diet and exercise discussed and encouraged. Routine health maintainance issues reviewed and updated.

## 2011-12-05 NOTE — Assessment & Plan Note (Signed)
Acute knee pain, anti inflammatories administered in office and also prescribed

## 2011-12-05 NOTE — Assessment & Plan Note (Signed)
Controlled, no change in medication  

## 2011-12-05 NOTE — Assessment & Plan Note (Signed)
Elevated LDL, low fat diet discussed and encouraged 

## 2011-12-05 NOTE — Assessment & Plan Note (Signed)
Controlled, no change in medication DASH diet and commitment to daily physical activity for a minimum of 30 minutes discussed and encouraged, as a part of hypertension management. The importance of attaining a healthy weight is also discussed.  

## 2011-12-05 NOTE — Progress Notes (Signed)
  Subjective:    Patient ID: Colleen [name], female    DOB: 18-Feb-1946, 66 y.o.   MRN: [id number]  HPI The PT is here for annual exam and re-evaluation of chronic medical conditions, medication management and review of any available recent lab and radiology data.  Preventive health is updated, specifically  Cancer screening and Immunization.   Questions or concerns regarding consultations or procedures which the PT has had in the interim are  addressed. The PT denies any adverse reactions to current medications since the last visit.  1 week h/o increased left knee pain and swelling limiting mobility slightly, feels she injured it  To provoke the symptoms       Review of Systems See HPI Denies recent fever or chills.c/o uncontrolled allergy symptoms Denies sinus pressure, nasal congestion, ear pain or sore throat. Denies chest congestion, productive cough or wheezing. Denies chest pains, palpitations and leg swelling Denies abdominal pain, nausea, vomiting,diarrhea or constipation.   Denies dysuria, frequency, hesitancy or incontinence.  Denies headaches, seizures, numbness, or tingling. Denies depression, anxiety or insomnia. Denies skin break down or rash.        Objective:   Physical Exam Pleasant well nourished female, alert and oriented x 3, in no cardio-pulmonary distress. Afebrile. HEENT No facial trauma or asymetry. Sinuses non tender. Nasal mucosa erythematous and edematous EOMI, PERTL, fundoscopic exam is normal, no hemorhage or exudate.  External ears normal, tympanic membranes clear. Oropharynx moist, no exudate, good dentition. Neck: supple, no adenopathy,JVD or thyromegaly.No bruits.  Chest: Clear to ascultation bilaterally.No crackles or wheezes. Non tender to palpation  Breast: No asymetry,no masses. No nipple discharge or inversion. No axillary or supraclavicular adenopathy  Cardiovascular system; Heart sounds normal,  S1 and  S2 ,no S3.  No murmur, or  thrill. Apical beat not displaced Peripheral pulses normal.  Abdomen: Soft, non tender, no organomegaly or masses. No bruits. Bowel sounds normal. No guarding, tenderness or rebound.  Rectal:  No mass. Guaiac negative stool.  GU: External genitalia normal. No lesions. Vaginal canal normal.No discharge. Uterus normal size, no adnexal masses, no cervical motion or adnexal tenderness.  Musculoskeletal exam: Full ROM of spine, hips , shoulders and reduced in left knee which is slightly swollen and tender on medial aspect No deformity ,swelling or crepitus noted. No muscle wasting or atrophy.   Neurologic: Cranial nerves 2 to 12 intact. Power, tone ,sensation and reflexes normal throughout. No disturbance in gait. No tremor.  Skin: Intact, no ulceration, erythema , scaling or rash noted. Pigmentation normal throughout  Psych; Normal mood and affect. Judgement and concentration normal        Assessment & Plan:

## 2011-12-07 ENCOUNTER — Ambulatory Visit (HOSPITAL_COMMUNITY)
Admission: RE | Admit: 2011-12-07 | Discharge: 2011-12-07 | Disposition: A | Discharge status: Home / Self Care | Payer: 59 | Source: Ambulatory Visit | Attending: Family Medicine | Admitting: Family Medicine

## 2011-12-07 DIAGNOSIS — Z139 Encounter for screening, unspecified: Secondary | ICD-10-CM

## 2011-12-07 DIAGNOSIS — Z1231 Encounter for screening mammogram for malignant neoplasm of breast: Secondary | ICD-10-CM | POA: Insufficient documentation

## 2011-12-14 ENCOUNTER — Other Ambulatory Visit: Payer: Self-pay | Admitting: Family Medicine

## 2012-05-06 ENCOUNTER — Other Ambulatory Visit: Payer: Self-pay | Admitting: Family Medicine

## 2012-06-05 ENCOUNTER — Ambulatory Visit: Payer: 59 | Admitting: Family Medicine

## 2012-06-21 ENCOUNTER — Ambulatory Visit: Payer: Medicare Other | Admitting: Family Medicine

## 2012-06-26 ENCOUNTER — Ambulatory Visit (INDEPENDENT_AMBULATORY_CARE_PROVIDER_SITE_OTHER): Payer: Medicare Other | Admitting: Family Medicine

## 2012-06-26 ENCOUNTER — Encounter: Payer: Self-pay | Admitting: Family Medicine

## 2012-06-26 ENCOUNTER — Other Ambulatory Visit: Payer: Self-pay | Admitting: Family Medicine

## 2012-06-26 VITALS — BP 130/80 | HR 74 | Resp 18 | Ht 61.5 in | Wt 149.1 lb

## 2012-06-26 DIAGNOSIS — E785 Hyperlipidemia, unspecified: Secondary | ICD-10-CM

## 2012-06-26 DIAGNOSIS — K219 Gastro-esophageal reflux disease without esophagitis: Secondary | ICD-10-CM

## 2012-06-26 DIAGNOSIS — I10 Essential (primary) hypertension: Secondary | ICD-10-CM

## 2012-06-26 DIAGNOSIS — B369 Superficial mycosis, unspecified: Secondary | ICD-10-CM

## 2012-06-26 MED ORDER — CLOTRIMAZOLE-BETAMETHASONE 1-0.05 % EX CREA: TOPICAL_CREAM | Freq: Two times a day (BID) | CUTANEOUS | Status: DC

## 2012-06-26 NOTE — Patient Instructions (Addendum)
Pelvic and breast in mid  August, call if you need me before  Fasting lipid, cmp, cbc today  Fasting lipid, cmp  And TSH in August before visit  Medication is sent for rash on back, which is fungal   It is important that you exercise regularly at least 30 minutes 5 times a week. If you develop chest pain, have severe difficulty breathing, or feel very tired, stop exercising immediately and seek medical attention   Pneumonia vaccine today  A healthy diet is rich in fruit, vegetables and whole grains. Poultry fish, nuts and beans are a healthy choice for protein rather then red meat. A low sodium diet and drinking 64 ounces of water daily is generally recommended. Oils and sweet should be limited. Carbohydrates especially for those who are diabetic or overweight, should be limited to 30-45 gram per meal. It is important to eat on a regular schedule, at least 3 times daily. Snacks should be primarily fruits, vegetables or nuts.

## 2012-06-27 ENCOUNTER — Other Ambulatory Visit: Payer: Self-pay | Admitting: Family Medicine

## 2012-06-27 LAB — CBC
HCT: 37.4 % (ref 36.0–46.0)
Hemoglobin: 13.4 g/dL (ref 12.0–15.0)
MCH: 29.8 pg (ref 26.0–34.0)
MCHC: 35.8 g/dL (ref 30.0–36.0)
RDW: 14.2 % (ref 11.5–15.5)

## 2012-06-27 LAB — COMPREHENSIVE METABOLIC PANEL
ALT: 54 U/L — ABNORMAL HIGH (ref 0–35)
AST: 34 U/L (ref 0–37)
Albumin: 4.6 g/dL (ref 3.5–5.2)
Alkaline Phosphatase: 103 U/L (ref 39–117)
Calcium: 9.8 mg/dL (ref 8.4–10.5)
Chloride: 103 mEq/L (ref 96–112)
Creat: 0.72 mg/dL (ref 0.50–1.10)
Potassium: 3.9 mEq/L (ref 3.5–5.3)

## 2012-06-27 LAB — LIPID PANEL
LDL Cholesterol: 160 mg/dL — ABNORMAL HIGH (ref 0–99)
Total CHOL/HDL Ratio: 4.4 Ratio
VLDL: 20 mg/dL (ref 0–40)

## 2012-06-29 NOTE — Assessment & Plan Note (Signed)
Acute rash, med to be applied x 14 days

## 2012-06-29 NOTE — Assessment & Plan Note (Signed)
Controlled, no change in medication DASH diet and commitment to daily physical activity for a minimum of 30 minutes discussed and encouraged, as a part of hypertension management. The importance of attaining a healthy weight is also discussed.  

## 2012-06-29 NOTE — Assessment & Plan Note (Signed)
Uncontrolled, low fat diet discussed, pt to resume low dose statin

## 2012-06-29 NOTE — Progress Notes (Signed)
  Subjective:    Patient ID: Colleen [name], female    DOB: 16-Jul-1945, 67 y.o.   MRN: [id number]  HPI The PT is here for follow up and re-evaluation of chronic medical conditions, medication management and review of any available recent lab and radiology data.  Preventive health is updated, specifically  Cancer screening and Immunization.   Questions or concerns regarding consultations or procedures which the PT has had in the interim are  addressed. The PT denies any adverse reactions to current medications since the last visit.  There are no new concerns.  C/o pruritic rash on her back for past 2 weeks, which she has had in the past    Review of Systems See HPI Denies recent fever or chills. Denies sinus pressure, nasal congestion, ear pain or sore throat. Denies chest congestion, productive cough or wheezing. Denies chest pains, palpitations and leg swelling Denies abdominal pain, nausea, vomiting,diarrhea or constipation.   Denies dysuria, frequency, hesitancy or incontinence. Denies joint pain, swelling and limitation in mobility. Denies headaches, seizures, numbness, or tingling. Stressed because her spouse continues to drink daily         Objective:   Physical Exam  Patient alert and oriented and in no cardiopulmonary distress.  HEENT: No facial asymmetry, EOMI, no sinus tenderness,  oropharynx pink and moist.  Neck supple no adenopathy.  Chest: Clear to auscultation bilaterally.  CVS: S1, S2 no murmurs, no S3.  ABD: Soft non tender. Bowel sounds normal.  Ext: No edema  MS: Adequate ROM spine, shoulders, hips and knees.  Skin: Intact, fungal rash on back  Psych: Good eye contact, normal affect. Memory intact not anxious or depressed appearing.  CNS: CN 2-12 intact, power, tone and sensation normal throughout.       Assessment & Plan:

## 2012-06-29 NOTE — Assessment & Plan Note (Signed)
Controlled, no change in medication  

## 2012-07-03 LAB — HEPATITIS PANEL, ACUTE: Hep A IgM: NEGATIVE

## 2012-07-09 NOTE — Addendum Note (Signed)
Addended by: Kandis Fantasia B on: 07/09/2012 10:07 PM   Modules accepted: Orders

## 2012-08-05 ENCOUNTER — Ambulatory Visit (INDEPENDENT_AMBULATORY_CARE_PROVIDER_SITE_OTHER): Payer: Medicare Other | Admitting: Family Medicine

## 2012-08-05 ENCOUNTER — Encounter: Payer: Self-pay | Admitting: Family Medicine

## 2012-08-05 VITALS — BP 130/80 | HR 76 | Resp 16 | Wt 150.0 lb

## 2012-08-05 DIAGNOSIS — J019 Acute sinusitis, unspecified: Secondary | ICD-10-CM

## 2012-08-05 DIAGNOSIS — J209 Acute bronchitis, unspecified: Secondary | ICD-10-CM

## 2012-08-05 DIAGNOSIS — J301 Allergic rhinitis due to pollen: Secondary | ICD-10-CM

## 2012-08-05 MED ORDER — METHYLPREDNISOLONE ACETATE 40 MG/ML IJ SUSP
40.0000 mg | Freq: Once | INTRAMUSCULAR | Status: AC
  Administered 2012-08-05: 40 mg via INTRAMUSCULAR

## 2012-08-05 MED ORDER — BENZONATATE 100 MG PO CAPS: 100.0000 mg | ORAL_CAPSULE | Freq: Three times a day (TID) | ORAL | Status: DC | PRN

## 2012-08-05 MED ORDER — LISINOPRIL-HYDROCHLOROTHIAZIDE 20-25 MG PO TABS: ORAL_TABLET | ORAL | Status: DC

## 2012-08-05 MED ORDER — AMOXICILLIN 500 MG PO TABS: 500.0000 mg | ORAL_TABLET | Freq: Three times a day (TID) | ORAL | Status: DC

## 2012-08-05 MED ORDER — ALBUTEROL SULFATE HFA 108 (90 BASE) MCG/ACT IN AERS: 2.0000 | INHALATION_SPRAY | Freq: Four times a day (QID) | RESPIRATORY_TRACT | Status: DC | PRN

## 2012-08-05 NOTE — Patient Instructions (Addendum)
Start antibiotics Prednisone shot given Cough perrrles Call if you do not improve Albuterol inhaler F/U as previous Dr. Lodema Hong

## 2012-08-07 DIAGNOSIS — J019 Acute sinusitis, unspecified: Secondary | ICD-10-CM | POA: Insufficient documentation

## 2012-08-07 DIAGNOSIS — J209 Acute bronchitis, unspecified: Secondary | ICD-10-CM | POA: Insufficient documentation

## 2012-08-07 NOTE — Progress Notes (Signed)
  Subjective:    Patient ID: Colleen [name], female    DOB: [birth year], 67 y.o.   MRN: [id number]  HPI  Cough with mild production, post nasal drip and sinus pressure/drainage for the past 4-5 days, has been hoarse and feels she is worsening. Taken OTC meds with out relief, no fever, non smoker.  Cough is worse at night she also starts wheezing at times  Review of Systems  GEN- denies fatigue, fever, weight loss,weakness, recent illness HEENT- denies eye drainage, change in vision, +nasal discharge, CVS- denies chest pain, palpitations RESP- denies SOB, +cough, +wheeze ABD- denies N/V, change in stools, abd pain Neuro- denies headache, dizziness, syncope, seizure activity       Objective:   Physical Exam GEN- NAD, alert and oriented x3, hoarse voice HEENT- PERRL, EOMI, non injected sclera, pink conjunctiva, MMM, oropharynx mild injection, TM clear bilat no effusion, + maxillary sinus tenderness, inflammed turbinates,  Nasal drainage  Neck- Supple, shotty LAD CVS- RRR, no murmur RESP-upper airway congestion, no wheeze, good air movement EXT- No edema Pulses- Radial 2+         Assessment & Plan:

## 2012-08-07 NOTE — Assessment & Plan Note (Signed)
Given depo medrol in office has worked well for pt ALbuterol prn Tessalon Antibiotics

## 2012-08-07 NOTE — Assessment & Plan Note (Signed)
likley viral with her symptoms worsening and prior history will treat per above

## 2012-11-04 ENCOUNTER — Other Ambulatory Visit: Payer: Self-pay | Admitting: Family Medicine

## 2012-11-04 DIAGNOSIS — Z139 Encounter for screening, unspecified: Secondary | ICD-10-CM

## 2012-12-09 ENCOUNTER — Ambulatory Visit (HOSPITAL_COMMUNITY)
Admission: RE | Admit: 2012-12-09 | Discharge: 2012-12-09 | Disposition: A | Discharge status: Home / Self Care | Payer: Medicare Other | Source: Ambulatory Visit | Attending: Family Medicine | Admitting: Family Medicine

## 2012-12-09 DIAGNOSIS — Z139 Encounter for screening, unspecified: Secondary | ICD-10-CM

## 2012-12-09 DIAGNOSIS — Z1231 Encounter for screening mammogram for malignant neoplasm of breast: Secondary | ICD-10-CM | POA: Insufficient documentation

## 2012-12-10 LAB — COMPREHENSIVE METABOLIC PANEL
ALT: 20 U/L (ref 0–35)
AST: 19 U/L (ref 0–37)
Albumin: 4.2 g/dL (ref 3.5–5.2)
CO2: 28 mEq/L (ref 19–32)
Calcium: 9.3 mg/dL (ref 8.4–10.5)
Chloride: 104 mEq/L (ref 96–112)
Creat: 0.8 mg/dL (ref 0.50–1.10)
Potassium: 4.1 mEq/L (ref 3.5–5.3)
Total Protein: 6.8 g/dL (ref 6.0–8.3)

## 2012-12-10 LAB — TSH: TSH: 0.965 u[IU]/mL (ref 0.350–4.500)

## 2012-12-10 LAB — LIPID PANEL
LDL Cholesterol: 119 mg/dL — ABNORMAL HIGH (ref 0–99)
Triglycerides: 51 mg/dL (ref <150)
VLDL: 10 mg/dL (ref 0–40)

## 2012-12-16 ENCOUNTER — Other Ambulatory Visit: Payer: Self-pay | Admitting: Family Medicine

## 2012-12-23 ENCOUNTER — Encounter: Payer: Self-pay | Admitting: Family Medicine

## 2012-12-23 ENCOUNTER — Ambulatory Visit (INDEPENDENT_AMBULATORY_CARE_PROVIDER_SITE_OTHER): Payer: Medicare Other | Admitting: Family Medicine

## 2012-12-23 VITALS — BP 130/80 | HR 72 | Resp 16 | Wt 148.8 lb

## 2012-12-23 DIAGNOSIS — Z0000 Encounter for general adult medical examination without abnormal findings: Secondary | ICD-10-CM

## 2012-12-23 DIAGNOSIS — Z01419 Encounter for gynecological examination (general) (routine) without abnormal findings: Secondary | ICD-10-CM

## 2012-12-23 DIAGNOSIS — E785 Hyperlipidemia, unspecified: Secondary | ICD-10-CM

## 2012-12-23 DIAGNOSIS — Z1211 Encounter for screening for malignant neoplasm of colon: Secondary | ICD-10-CM

## 2012-12-23 DIAGNOSIS — I10 Essential (primary) hypertension: Secondary | ICD-10-CM

## 2012-12-23 MED ORDER — CALCITONIN (SALMON) 200 UNIT/ACT NA SOLN: NASAL | Status: DC

## 2012-12-23 NOTE — Patient Instructions (Addendum)
F/u in January, please call if you need me before  Calll and come in for flu vaccine in October please  Please cut down on fried and fatty foods and red meat, your bad cholesterol is too high still, though your numbers  have improved a lot  Fasting lipid and cmp in January  Start centrum silver/over 50 fpr women one daily  Start aspirin coated, 81 mg one tablet once daily to reduce stroke risk  Continue daily physical activity please  Aim to lose 3 popunds to get to 145 by January please

## 2012-12-23 NOTE — Assessment & Plan Note (Signed)
Pelvic and breast exam as documented. No abnormality on exam noted. Pt encouraged to continue healthy lifestyle and ain for her goal weight of 145 pounds in the next 5 month

## 2012-12-23 NOTE — Progress Notes (Signed)
  Subjective:    Patient ID: Colleen [name], female    DOB: Sep 14, 1945, 67 y.o.   MRN: [id number]  HPI  Pt in for pelvic and breast exam She is doing well, continues to commit to daily physical activity, weight is within normal range for her age, though she states she would like to lose an additional 3 pounds Sleep is per routine approx 4 to 5 hrs without several awakenings, but is adequate. Advised to switch from claritin to benadryl for allergy med  Review of Systems See HPI Denies recent fever or chills. Denies sinus pressure, nasal congestion, ear pain or sore throat. Denies chest congestion, productive cough or wheezing. Denies chest pains, palpitations and leg swelling Denies abdominal pain, nausea, vomiting,diarrhea or constipation.   Denies dysuria, frequency, hesitancy or incontinence. Denies joint pain, swelling and limitation in mobility. Denies headaches, seizures, numbness, or tingling. Denies depression, anxiety or insomnia. Denies skin break down or rash.        Objective:   Physical Exam  Pleasant well nourished female, alert and oriented x 3, in no cardio-pulmonary distress. Afebrile. HEENT No facial trauma or asymetry. Sinuses non tender.  EOMI, PERTL, fundoscopic exam is normal, no hemorhage or exudate.  External ears normal, tympanic membranes clear. Oropharynx moist, no exudate, fair dentition. Neck: supple, no adenopathy,JVD or thyromegaly.No bruits.  Chest: Clear to ascultation bilaterally.No crackles or wheezes. Non tender to palpation  Breast: No asymetry,no masses. No nipple discharge or inversion. No axillary or supraclavicular adenopathy  Cardiovascular system; Heart sounds normal,  S1 and  S2 ,no S3.  No murmur, or thrill. Apical beat not displaced Peripheral pulses normal.  Abdomen: Soft, non tender, no organomegaly or masses. No bruits. Bowel sounds normal. No guarding, tenderness or rebound.  Rectal:  No mass. Guaiac negative  stool.  GU: External genitalia normal. No lesions. Vaginal canal normal.No discharge. Uterus normal size, no adnexal masses, no cervical motion or adnexal tenderness.  Musculoskeletal exam: Full ROM of spine, hips , shoulders and knees. No deformity ,swelling or crepitus noted. No muscle wasting or atrophy.   Neurologic: Cranial nerves 2 to 12 intact. Power, tone ,sensation and reflexes normal throughout. No disturbance in gait. No tremor.  Skin: Intact, no ulceration, erythema , scaling or rash noted. Pigmentation normal throughout  Psych; Normal mood and affect. Judgement and concentration normal       Assessment & Plan:

## 2013-01-28 ENCOUNTER — Other Ambulatory Visit: Payer: Self-pay | Admitting: Family Medicine

## 2013-01-29 ENCOUNTER — Ambulatory Visit (INDEPENDENT_AMBULATORY_CARE_PROVIDER_SITE_OTHER): Payer: Medicare Other

## 2013-01-29 DIAGNOSIS — Z23 Encounter for immunization: Secondary | ICD-10-CM

## 2013-03-10 ENCOUNTER — Other Ambulatory Visit: Payer: Self-pay | Admitting: Family Medicine

## 2013-04-18 ENCOUNTER — Encounter: Payer: Self-pay | Admitting: Family Medicine

## 2013-04-18 ENCOUNTER — Ambulatory Visit (INDEPENDENT_AMBULATORY_CARE_PROVIDER_SITE_OTHER): Payer: Medicare Other | Admitting: Family Medicine

## 2013-04-18 VITALS — BP 130/80 | HR 63 | Resp 16 | Ht 61.5 in | Wt 148.4 lb

## 2013-04-18 DIAGNOSIS — M899 Disorder of bone, unspecified: Secondary | ICD-10-CM

## 2013-04-18 DIAGNOSIS — I10 Essential (primary) hypertension: Secondary | ICD-10-CM

## 2013-04-18 DIAGNOSIS — M8580 Other specified disorders of bone density and structure, unspecified site: Secondary | ICD-10-CM

## 2013-04-18 DIAGNOSIS — M810 Age-related osteoporosis without current pathological fracture: Secondary | ICD-10-CM

## 2013-04-18 DIAGNOSIS — J301 Allergic rhinitis due to pollen: Secondary | ICD-10-CM

## 2013-04-18 MED ORDER — FLUTICASONE PROPIONATE 50 MCG/ACT NA SUSP: 2.0000 | Freq: Every day | NASAL | Status: DC

## 2013-04-18 MED ORDER — PREDNISONE 5 MG PO TABS: 5.0000 mg | ORAL_TABLET | Freq: Two times a day (BID) | ORAL | Status: AC

## 2013-04-18 NOTE — Patient Instructions (Addendum)
F/u as  Before, please call if you need me before  Please start daily nasal spray for allergies, and you are prescribed prednisone 5mg  twice daily for 5 days  It is important that you exercise regularly at least 30 minutes 5 times a week. If you develop chest pain, have severe difficulty breathing, or feel very tired, stop exercising immediately and seek medical attention     Labs as already ordered for next visit  You need a bone density scan, stop at front on the way out please

## 2013-04-18 NOTE — Progress Notes (Signed)
   Subjective:    Patient ID: Colleen [name], female    DOB: 07-21-1945, 66 y.o.   MRN: [id number]  HPI 2 week h/o left periorbital discomfort, also increased post nasal drainage , no fever or chills. Eyes have been watering, recently evaluated by eye specialist and referred here for f/u Generally doing well otherwise  Pain rated a 2, no localized weakness , numbness or vision change   Review of Systems    See HPI Denies recent fever or chills. Denies sinus pressure, nasal congestion, ear pain or sore throat. Denies chest congestion, productive cough or wheezing. Denies chest pains, palpitations and leg swelling Denies abdominal pain, nausea, vomiting,diarrhea or constipation.   Denies dysuria, frequency, hesitancy or incontinence. Denies joint pain, swelling and limitation in mobility.     Objective:   Physical Exam Patient alert and oriented and in no cardiopulmonary distress.  HEENT: No facial asymmetry, EOMI, no sinus tenderness,  oropharynx pink and moist.  Neck supple no adenopathy.Non tender over temporal area  Chest: Clear to auscultation bilaterally.  CVS: S1, S2 no murmurs, no S3.  ABD: Soft non tender. Bowel sounds normal.  Ext: No edema  MS: Adequate ROM spine, shoulders, hips and knees.        Assessment & Plan:

## 2013-04-19 NOTE — Assessment & Plan Note (Signed)
Controlled, no change in medication DASH diet and commitment to daily physical activity for a minimum of 30 minutes discussed and encouraged, as a part of hypertension management. The importance of attaining a healthy weight is also discussed.  

## 2013-04-19 NOTE — Assessment & Plan Note (Signed)
rept dexa due

## 2013-04-19 NOTE — Assessment & Plan Note (Signed)
Uncontrolled with new onset left peri orbital pressure, start flonase and prednisone for 5 days

## 2013-05-16 LAB — LIPID PANEL
Cholesterol: 219 mg/dL — ABNORMAL HIGH (ref 0–200)
HDL: 55 mg/dL (ref >39)
LDL Cholesterol: 145 mg/dL — ABNORMAL HIGH (ref 0–99)
Total CHOL/HDL Ratio: 4 Ratio
Triglycerides: 93 mg/dL (ref <150)
VLDL: 19 mg/dL (ref 0–40)

## 2013-05-16 LAB — COMPREHENSIVE METABOLIC PANEL
ALT: 22 U/L (ref 0–35)
AST: 21 U/L (ref 0–37)
Albumin: 4.4 g/dL (ref 3.5–5.2)
Alkaline Phosphatase: 104 U/L (ref 39–117)
BUN: 14 mg/dL (ref 6–23)
CO2: 28 mEq/L (ref 19–32)
Calcium: 9.4 mg/dL (ref 8.4–10.5)
Chloride: 102 mEq/L (ref 96–112)
Creat: 0.76 mg/dL (ref 0.50–1.10)
Glucose, Bld: 92 mg/dL (ref 70–99)
Potassium: 4.5 mEq/L (ref 3.5–5.3)
Sodium: 137 mEq/L (ref 135–145)
Total Bilirubin: 0.5 mg/dL (ref 0.3–1.2)
Total Protein: 7.1 g/dL (ref 6.0–8.3)

## 2013-05-19 ENCOUNTER — Other Ambulatory Visit: Payer: Self-pay | Admitting: Family Medicine

## 2013-05-20 ENCOUNTER — Ambulatory Visit (INDEPENDENT_AMBULATORY_CARE_PROVIDER_SITE_OTHER): Payer: 59 | Admitting: Family Medicine

## 2013-05-20 ENCOUNTER — Encounter: Payer: Self-pay | Admitting: Family Medicine

## 2013-05-20 VITALS — BP 120/72 | HR 79 | Resp 16 | Ht 61.5 in | Wt 147.8 lb

## 2013-05-20 DIAGNOSIS — I10 Essential (primary) hypertension: Secondary | ICD-10-CM

## 2013-05-20 DIAGNOSIS — M810 Age-related osteoporosis without current pathological fracture: Secondary | ICD-10-CM

## 2013-05-20 DIAGNOSIS — R0789 Other chest pain: Secondary | ICD-10-CM

## 2013-05-20 DIAGNOSIS — E785 Hyperlipidemia, unspecified: Secondary | ICD-10-CM

## 2013-05-20 DIAGNOSIS — J301 Allergic rhinitis due to pollen: Secondary | ICD-10-CM

## 2013-05-20 MED ORDER — LISINOPRIL-HYDROCHLOROTHIAZIDE 20-25 MG PO TABS: ORAL_TABLET | ORAL | Status: DC

## 2013-05-20 MED ORDER — PRAVASTATIN SODIUM 40 MG PO TABS: 40.0000 mg | ORAL_TABLET | Freq: Every day | ORAL | Status: DC

## 2013-05-20 NOTE — Patient Instructions (Addendum)
F/u in 4.5 month,please call if you need me before  Please cut back on using oil in your food and sugar and continue regular exercise  Increase dose of pravastatin to 40mg  daily, oK to take TWO 20mg  tabs till done  EKG shows normal rhythm with no specific evidence of heart damage, not much different from previous  You are referred for a bone density test   Fasting lipid, cmp in 4.5 month, before follow up

## 2013-05-20 NOTE — Progress Notes (Signed)
   Subjective:    Patient ID: Colleen [name], female    DOB: 06/10/1945, 68 y.o.   MRN: [id number]  HPI The PT is here for follow up and re-evaluation of chronic medical conditions, medication management and review of any available recent lab and radiology data. Does admit to cooking with oil on a regular basis so she can "enjoy her food" Preventive health is updated, specifically  Cancer screening and Immunization.   Questions or concerns regarding consultations or procedures which the PT has had in the interim are  addressed. The PT denies any adverse reactions to current medications since the last visit.  C/o intermittent substernal discomfort with some fatigue in the past sevral months. Pain is not aggravated by activity , may occur in supine position also, does not radiate and there is no associated nausea or diaphoresis    Review of Systems See HPI Denies recent fever or chills. Denies sinus pressure, nasal congestion, ear pain or sore throat. Denies chest congestion, productive cough or wheezing. Denies PND, orthopnea, palpitations and leg swelling Denies abdominal pain, nausea, vomiting,diarrhea or constipation.   Denies dysuria, frequency, hesitancy or incontinence. Denies joint pain, swelling and limitation in mobility. Denies headaches, seizures, numbness, or tingling. Denies depression, anxiety or insomnia. Denies skin break down or rash.        Objective:   Physical Exam  Patient alert and oriented and in no cardiopulmonary distress.  HEENT: No facial asymmetry, EOMI, no sinus tenderness,  oropharynx pink and moist.  Neck supple no adenopathy.  Chest: Clear to auscultation bilaterally.No reproducible chest wall pain  CVS: S1, S2 no murmurs, no S3. EKG: NSR no acute ischemic changes, no significant change from previous study ABD: Soft non tender. Bowel sounds normal.  Ext: No edema  MS: Adequate ROM spine, shoulders, hips and knees.  Skin: Intact, no  ulcerations or rash noted.  Psych: Good eye contact, normal affect. Memory intact not anxious or depressed appearing.  CNS: CN 2-12 intact, power, tone and sensation normal throughout.       Assessment & Plan:

## 2013-05-25 DIAGNOSIS — R0789 Other chest pain: Secondary | ICD-10-CM | POA: Insufficient documentation

## 2013-05-25 NOTE — Assessment & Plan Note (Addendum)
Intermittent substernal chest discomfort EKG negative for acute ischemia , no change from previous EKG, most likely GERd related at times

## 2013-05-25 NOTE — Assessment & Plan Note (Signed)
uodated dexa needed and ordered

## 2013-05-25 NOTE — Assessment & Plan Note (Signed)
Uncontrolled, dose inc in pravachol Hyperlipidemia:Low fat diet discussed and encouraged.  Needs to make dietary changes also

## 2013-05-25 NOTE — Assessment & Plan Note (Signed)
Controlled, no change in medication DASH diet and commitment to daily physical activity for a minimum of 30 minutes discussed and encouraged, as a part of hypertension management. The importance of attaining a healthy weight is also discussed.  

## 2013-05-25 NOTE — Assessment & Plan Note (Signed)
Controlled, no change in medication  

## 2013-06-16 ENCOUNTER — Encounter: Payer: Self-pay | Admitting: Family Medicine

## 2013-06-16 ENCOUNTER — Ambulatory Visit (HOSPITAL_COMMUNITY)
Admission: RE | Admit: 2013-06-16 | Discharge: 2013-06-16 | Disposition: A | Discharge status: Home / Self Care | Payer: 59 | Source: Ambulatory Visit | Attending: Family Medicine | Admitting: Family Medicine

## 2013-06-16 DIAGNOSIS — M818 Other osteoporosis without current pathological fracture: Secondary | ICD-10-CM | POA: Insufficient documentation

## 2013-06-16 DIAGNOSIS — M8580 Other specified disorders of bone density and structure, unspecified site: Secondary | ICD-10-CM

## 2013-06-17 ENCOUNTER — Other Ambulatory Visit: Payer: Self-pay

## 2013-06-17 ENCOUNTER — Encounter: Payer: Self-pay | Admitting: Family Medicine

## 2013-06-17 MED ORDER — ALENDRONATE SODIUM 70 MG PO TABS: 70.0000 mg | ORAL_TABLET | ORAL | Status: DC

## 2013-06-20 ENCOUNTER — Telehealth: Payer: Self-pay

## 2013-06-20 NOTE — Telephone Encounter (Signed)
Patient was just confused on what the generic name was for the Fosamax and she was given the instructions for use again.

## 2013-10-01 ENCOUNTER — Ambulatory Visit: Payer: Medicare HMO | Admitting: Family Medicine

## 2013-10-09 LAB — COMPREHENSIVE METABOLIC PANEL
ALT: 39 U/L — AB (ref 0–35)
AST: 33 U/L (ref 0–37)
Albumin: 4.5 g/dL (ref 3.5–5.2)
Alkaline Phosphatase: 90 U/L (ref 39–117)
BUN: 12 mg/dL (ref 6–23)
CALCIUM: 9.8 mg/dL (ref 8.4–10.5)
CHLORIDE: 103 meq/L (ref 96–112)
CO2: 28 meq/L (ref 19–32)
Creat: 0.81 mg/dL (ref 0.50–1.10)
Glucose, Bld: 102 mg/dL — ABNORMAL HIGH (ref 70–99)
POTASSIUM: 4.4 meq/L (ref 3.5–5.3)
SODIUM: 140 meq/L (ref 135–145)
TOTAL PROTEIN: 7.4 g/dL (ref 6.0–8.3)
Total Bilirubin: 0.6 mg/dL (ref 0.2–1.2)

## 2013-10-09 LAB — LIPID PANEL
Cholesterol: 209 mg/dL — ABNORMAL HIGH (ref 0–200)
HDL: 49 mg/dL (ref >39)
LDL CALC: 135 mg/dL — AB (ref 0–99)
Total CHOL/HDL Ratio: 4.3 Ratio
Triglycerides: 127 mg/dL (ref <150)
VLDL: 25 mg/dL (ref 0–40)

## 2013-10-13 ENCOUNTER — Encounter: Payer: Self-pay | Admitting: Family Medicine

## 2013-10-13 ENCOUNTER — Ambulatory Visit (INDEPENDENT_AMBULATORY_CARE_PROVIDER_SITE_OTHER): Payer: 59 | Admitting: Family Medicine

## 2013-10-13 VITALS — BP 138/78 | HR 73 | Resp 16 | Wt 147.8 lb

## 2013-10-13 DIAGNOSIS — Z139 Encounter for screening, unspecified: Secondary | ICD-10-CM

## 2013-10-13 DIAGNOSIS — R7301 Impaired fasting glucose: Secondary | ICD-10-CM | POA: Insufficient documentation

## 2013-10-13 DIAGNOSIS — J301 Allergic rhinitis due to pollen: Secondary | ICD-10-CM

## 2013-10-13 DIAGNOSIS — I10 Essential (primary) hypertension: Secondary | ICD-10-CM

## 2013-10-13 DIAGNOSIS — M810 Age-related osteoporosis without current pathological fracture: Secondary | ICD-10-CM

## 2013-10-13 DIAGNOSIS — E785 Hyperlipidemia, unspecified: Secondary | ICD-10-CM

## 2013-10-13 DIAGNOSIS — K219 Gastro-esophageal reflux disease without esophagitis: Secondary | ICD-10-CM

## 2013-10-13 MED ORDER — EZETIMIBE 10 MG PO TABS: 10.0000 mg | ORAL_TABLET | Freq: Every day | ORAL | Status: DC

## 2013-10-13 MED ORDER — LISINOPRIL-HYDROCHLOROTHIAZIDE 20-25 MG PO TABS: ORAL_TABLET | ORAL | Status: DC

## 2013-10-13 MED ORDER — OMEPRAZOLE 20 MG PO CPDR: DELAYED_RELEASE_CAPSULE | ORAL | Status: DC

## 2013-10-13 NOTE — Assessment & Plan Note (Signed)
Patient educated about the importance of limiting  Carbohydrate intake , the need to commit to daily physical activity for a minimum of 30 minutes , and to commit weight loss. The fact that changes in all these areas will reduce or eliminate all together the development of diabetes is stressed.   Check HBa1C next visit

## 2013-10-13 NOTE — Assessment & Plan Note (Signed)
Adequate control  With internittent use of nasal steroid

## 2013-10-13 NOTE — Assessment & Plan Note (Signed)
Controlled, no change in medication DASH diet and commitment to daily physical activity for a minimum of 30 minutes discussed and encouraged, as a part of hypertension management. The importance of attaining a healthy weight is also discussed.  

## 2013-10-13 NOTE — Patient Instructions (Signed)
First Annual wellness in October, call if you need me before  Continue healthy habits, cut back on cake and ice cream  Cholesterol is high and blood sugar slightly elevated  New for cholesterol is zetia one at bedtime  Fasting lipid, cmp and HBA1C, cBc, TSH and vit D in [place]  Mammo is due in August pls sched

## 2013-10-13 NOTE — Assessment & Plan Note (Signed)
Uncontrolled and pt is statin intolerant . \Ditary modification advised and will try zetia

## 2013-10-13 NOTE — Assessment & Plan Note (Signed)
Continue current treatment and daily physical activity

## 2013-10-13 NOTE — Progress Notes (Signed)
   Subjective:    Patient ID: Colleen [name], female    DOB: 28-Jul-1945, 68 y.o.   MRN: [id number]  HPI The PT is here for follow up and re-evaluation of chronic medical conditions, medication management and review of any available recent lab and radiology data.  Preventive health is updated, specifically  Cancer screening and Immunization.    The PT c/o muscle cramps with pravachol andd has had difficulty with other statins also, hence non compliant, will try zetia There are no new concerns. Has been overindulging in sweets and deserts There are no specific complaints       Review of Systems See HPI Denies recent fever or chills. Denies sinus pressure, nasal congestion, ear pain or sore throat. Denies chest congestion, productive cough or wheezing. Denies chest pains, palpitations and leg swelling Denies abdominal pain, nausea, vomiting,diarrhea or constipation.   Denies dysuria, frequency, hesitancy or incontinence. Denies joint pain, swelling and limitation in mobility. Denies headaches, seizures, numbness, or tingling. Denies depression, anxiety or insomnia. Denies skin break down or rash.        Objective:   Physical Exam    BP 138/78  Pulse 73  Resp 16  Wt 147 lb 12.8 oz (67.042 kg)  SpO2 97% Patient alert and oriented and in no cardiopulmonary distress.  HEENT: No facial asymmetry, EOMI,   oropharynx pink and moist.  Neck supple no JVD, no mass.  Chest: Clear to auscultation bilaterally.  CVS: S1, S2 no murmurs, no S3.  ABD: Soft non tender.   Ext: No edema  MS: Adequate ROM spine, shoulders, hips and knees.  Skin: Intact, no ulcerations or rash noted.  Psych: Good eye contact, normal affect. Memory intact not anxious or depressed appearing.  CNS: CN 2-12 intact, power,  normal throughout.no focal deficits noted.     Assessment & Plan:  HYPERTENSION Controlled, no change in medication DASH diet and commitment to daily physical activity for a  minimum of 30 minutes discussed and encouraged, as a part of hypertension management. The importance of attaining a healthy weight is also discussed.   HYPERLIPIDEMIA Uncontrolled and pt is statin intolerant . \Ditary modification advised and will try zetia  OSTEOPOROSIS Continue current treatment and daily physical activity  GERD Controlled, no change in medication   ALLERGIC RHINITIS, SEASONAL Adequate control  With internittent use of nasal steroid  IFG (impaired fasting glucose) Patient educated about the importance of limiting  Carbohydrate intake , the need to commit to daily physical activity for a minimum of 30 minutes , and to commit weight loss. The fact that changes in all these areas will reduce or eliminate all together the development of diabetes is stressed.   Check HBa1C next visit

## 2013-10-13 NOTE — Assessment & Plan Note (Signed)
Controlled, no change in medication  

## 2013-10-30 ENCOUNTER — Telehealth: Payer: Self-pay | Admitting: Family Medicine

## 2013-11-04 NOTE — Telephone Encounter (Signed)
pls try and PA the zetia she reports muscle cramps with multiple statins , pls let her know

## 2013-11-10 ENCOUNTER — Telehealth: Payer: Self-pay | Admitting: *Deleted

## 2013-11-10 NOTE — Telephone Encounter (Signed)
Pt called stating she has a sinus infection and she would like something for it. Please advise

## 2013-11-11 ENCOUNTER — Telehealth: Payer: Self-pay | Admitting: Family Medicine

## 2013-11-11 MED ORDER — PREDNISONE (PAK) 5 MG PO TABS: 5.0000 mg | ORAL_TABLET | ORAL | Status: DC

## 2013-11-11 NOTE — Telephone Encounter (Signed)
pred dose pack sent in please let her know sounds like uncontrolled allergies

## 2013-11-11 NOTE — Telephone Encounter (Signed)
Called and left message notifying patient of rx  

## 2013-11-11 NOTE — Telephone Encounter (Signed)
See next telephone message. 

## 2013-11-11 NOTE — Telephone Encounter (Signed)
Pt c/o hard coughing with white phlegm production and sneezing since Friday. Pain and pressure over eyes and sinus congestion which she is unable to blow out. Wanted to know if you recommended a shot or OV or if you would just prescribe some meds for her. Please advise

## 2013-11-14 ENCOUNTER — Other Ambulatory Visit: Payer: Self-pay | Admitting: Family Medicine

## 2013-11-14 DIAGNOSIS — Z139 Encounter for screening, unspecified: Secondary | ICD-10-CM

## 2013-11-26 ENCOUNTER — Other Ambulatory Visit: Payer: Self-pay

## 2013-11-26 MED ORDER — CHOLINE FENOFIBRATE 45 MG PO CPDR: 45.0000 mg | DELAYED_RELEASE_CAPSULE | Freq: Every day | ORAL | Status: DC

## 2013-11-26 NOTE — Telephone Encounter (Signed)
Med cannot be PA'd per pharmacy. $120 is the amount of her copay. [place]. Please advise

## 2013-11-26 NOTE — Telephone Encounter (Signed)
May want to try fenofibrate 45  Mg daily #30 refill 3,

## 2013-11-26 NOTE — Telephone Encounter (Signed)
Med changed and left message for patient

## 2013-12-15 ENCOUNTER — Ambulatory Visit (HOSPITAL_COMMUNITY)
Admission: RE | Admit: 2013-12-15 | Discharge: 2013-12-15 | Disposition: A | Discharge status: Home / Self Care | Payer: Medicare HMO | Source: Ambulatory Visit | Attending: Family Medicine | Admitting: Family Medicine

## 2013-12-15 DIAGNOSIS — Z1231 Encounter for screening mammogram for malignant neoplasm of breast: Secondary | ICD-10-CM | POA: Diagnosis present

## 2013-12-15 DIAGNOSIS — Z139 Encounter for screening, unspecified: Secondary | ICD-10-CM

## 2014-02-19 ENCOUNTER — Telehealth: Payer: Self-pay | Admitting: *Deleted

## 2014-02-19 NOTE — Telephone Encounter (Signed)
Pt called requesting to speak with a nurse. Please advise [phone]

## 2014-02-19 NOTE — Telephone Encounter (Signed)
Patient states that she has sinus drainage that is clear x 2 days.  Advised to take Tylenol for facial pain, Sudafed for sinus drainage, and Loratadine for allergies.  Patient states understanding and will call if symptoms persist.

## 2014-03-04 LAB — COMPREHENSIVE METABOLIC PANEL
ALK PHOS: 91 U/L (ref 39–117)
ALT: 56 U/L — ABNORMAL HIGH (ref 0–35)
AST: 32 U/L (ref 0–37)
Albumin: 4.8 g/dL (ref 3.5–5.2)
BILIRUBIN TOTAL: 0.6 mg/dL (ref 0.2–1.2)
BUN: 13 mg/dL (ref 6–23)
CO2: 30 mEq/L (ref 19–32)
Calcium: 9.6 mg/dL (ref 8.4–10.5)
Chloride: 103 mEq/L (ref 96–112)
Creat: 0.76 mg/dL (ref 0.50–1.10)
GLUCOSE: 85 mg/dL (ref 70–99)
Potassium: 3.9 mEq/L (ref 3.5–5.3)
Sodium: 140 mEq/L (ref 135–145)
Total Protein: 7.8 g/dL (ref 6.0–8.3)

## 2014-03-04 LAB — TSH: TSH: 1.065 u[IU]/mL (ref 0.350–4.500)

## 2014-03-04 LAB — LIPID PANEL
CHOLESTEROL: 282 mg/dL — AB (ref 0–200)
HDL: 60 mg/dL (ref >39)
LDL Cholesterol: 208 mg/dL — ABNORMAL HIGH (ref 0–99)
Total CHOL/HDL Ratio: 4.7 Ratio
Triglycerides: 72 mg/dL (ref <150)
VLDL: 14 mg/dL (ref 0–40)

## 2014-03-04 LAB — HEMOGLOBIN A1C
HEMOGLOBIN A1C: 5.7 % — AB (ref <5.7)
MEAN PLASMA GLUCOSE: 117 mg/dL — AB (ref <117)

## 2014-03-04 LAB — CBC WITH DIFFERENTIAL/PLATELET
Basophils Absolute: 0.1 10*3/uL (ref 0.0–0.1)
Basophils Relative: 1 % (ref 0–1)
Eosinophils Absolute: 0.1 10*3/uL (ref 0.0–0.7)
Eosinophils Relative: 1 % (ref 0–5)
HEMATOCRIT: 40.4 % (ref 36.0–46.0)
HEMOGLOBIN: 14.1 g/dL (ref 12.0–15.0)
LYMPHS PCT: 49 % — AB (ref 12–46)
Lymphs Abs: 2.9 10*3/uL (ref 0.7–4.0)
MCH: 30 pg (ref 26.0–34.0)
MCHC: 34.9 g/dL (ref 30.0–36.0)
MCV: 86 fL (ref 78.0–100.0)
MONO ABS: 0.3 10*3/uL (ref 0.1–1.0)
MONOS PCT: 5 % (ref 3–12)
NEUTROS PCT: 44 % (ref 43–77)
Neutro Abs: 2.6 10*3/uL (ref 1.7–7.7)
Platelets: 287 10*3/uL (ref 150–400)
RBC: 4.7 MIL/uL (ref 3.87–5.11)
RDW: 13.5 % (ref 11.5–15.5)
WBC: 5.9 10*3/uL (ref 4.0–10.5)

## 2014-03-04 LAB — VITAMIN D 25 HYDROXY (VIT D DEFICIENCY, FRACTURES): Vit D, 25-Hydroxy: 40 ng/mL (ref 30–89)

## 2014-03-16 ENCOUNTER — Encounter: Payer: Self-pay | Admitting: Family Medicine

## 2014-03-16 ENCOUNTER — Ambulatory Visit (INDEPENDENT_AMBULATORY_CARE_PROVIDER_SITE_OTHER): Payer: Medicare HMO

## 2014-03-16 ENCOUNTER — Ambulatory Visit (INDEPENDENT_AMBULATORY_CARE_PROVIDER_SITE_OTHER): Payer: Medicare HMO | Admitting: Family Medicine

## 2014-03-16 VITALS — BP 142/82 | HR 75 | Resp 16 | Ht 62.0 in | Wt 150.8 lb

## 2014-03-16 DIAGNOSIS — Z23 Encounter for immunization: Secondary | ICD-10-CM

## 2014-03-16 DIAGNOSIS — Z0000 Encounter for general adult medical examination without abnormal findings: Secondary | ICD-10-CM | POA: Insufficient documentation

## 2014-03-16 DIAGNOSIS — M8580 Other specified disorders of bone density and structure, unspecified site: Secondary | ICD-10-CM

## 2014-03-16 DIAGNOSIS — E785 Hyperlipidemia, unspecified: Secondary | ICD-10-CM

## 2014-03-16 DIAGNOSIS — R7301 Impaired fasting glucose: Secondary | ICD-10-CM

## 2014-03-16 DIAGNOSIS — I10 Essential (primary) hypertension: Secondary | ICD-10-CM

## 2014-03-16 MED ORDER — LISINOPRIL-HYDROCHLOROTHIAZIDE 20-25 MG PO TABS: ORAL_TABLET | ORAL | Status: DC

## 2014-03-16 MED ORDER — EZETIMIBE 10 MG PO TABS: 10.0000 mg | ORAL_TABLET | Freq: Every day | ORAL | Status: DC

## 2014-03-16 MED ORDER — ALENDRONATE SODIUM 70 MG PO TABS
70.0000 mg | ORAL_TABLET | ORAL | Status: DC
Start: 2014-03-16 — End: 2014-07-18

## 2014-03-16 MED ORDER — CHOLINE FENOFIBRATE 45 MG PO CPDR: 45.0000 mg | DELAYED_RELEASE_CAPSULE | Freq: Every day | ORAL | Status: DC

## 2014-03-16 MED ORDER — OMEPRAZOLE 20 MG PO CPDR: DELAYED_RELEASE_CAPSULE | ORAL | Status: DC

## 2014-03-16 NOTE — Patient Instructions (Addendum)
Annual physical exam in 3 month, call if you need me before  Flu vaccine today,   Cholesterol and sugar are elevated, new for cholesterol is zetia along with your current med, if you can afford the zetia.Coupon is given  Work on dietary change as discussed  Fasting lipid, cmp, HBa1C in 3 month, before visit  Information on diet is provided  MOST form is provided

## 2014-03-16 NOTE — Assessment & Plan Note (Signed)
Vaccine administered at visit.  

## 2014-03-16 NOTE — Progress Notes (Signed)
Subjective:    Patient ID: Colleen [name], female    DOB: [birth year], 68 y.o.   MRN: [id number]  HPI Preventive Screening-Counseling & Management   Patient present here today for a Medicare annual wellness visit.   Current Problems (verified)   Medications Prior to Visit Allergies (verified)   PAST HISTORY  Family History (verified)   Social History Married, with 2 children, retired    Risk Factors  Current exercise habits:  Does aerobics 3-4 days a week 15-20 mins   Dietary issues discussed: Heart heathy diet, eating more fruits and vegetables and limiting fried fatty foods and red meat    Cardiac risk factors: Mother had MI at age 67   Depression Screen  (Note: if answer to either of the following is "Yes", a more complete depression screening is indicated)   Over the past two weeks, have you felt down, depressed or hopeless? No  Over the past two weeks, have you felt little interest or pleasure in doing things? No  Have you lost interest or pleasure in daily life? No  Do you often feel hopeless? No  Do you cry easily over simple problems? No   Activities of Daily Living  In your present state of health, do you have any difficulty performing the following activities?  Driving?: No Managing money?: No Feeding yourself?:No Getting from bed to chair?:No Climbing a flight of stairs?:No Preparing food and eating?:No Bathing or showering?:No Getting dressed?:No Getting to the toilet?:No Using the toilet?:No Moving around from place to place?: No  Fall Risk Assessment In the past year have you fallen or had a near fall?:No Are you currently taking any medications that make you dizzy?:No   Hearing Difficulties: No Do you often ask people to speak up or repeat themselves?:no  Do you experience ringing or noises in your ears?:No Do you have difficulty understanding soft or whispered voices?: sometimes with her left ear   Cognitive Testing  Alert? Yes Normal  Appearance?Yes  Oriented to person? Yes Place? Yes  Time? Yes  Displays appropriate judgment?Yes  Can read the correct time from a watch face? yes Are you having problems remembering things? A little sometimes, nothing major , 3 word recall is excellent  Advanced Directives have been discussed with the patient?Yes, brochure given. Doesn't have a living will , she is a full code currently, mODST form given to patient for further review   List the Names of Other Physician/Practitioners you currently use:    Indicate any recent Medical Services you may have received from other than [facility] providers in the past year (date may be approximate).   Assessment:    Annual Wellness Exam   Plan:     Medicare Attestation  I have personally reviewed:  The patient's medical and social history  Their use of alcohol, tobacco or illicit drugs  Their current medications and supplements  The patient's functional ability including ADLs,fall risks, home safety risks, cognitive, and hearing and visual impairment  Diet and physical activities  Evidence for depression or mood disorders  The patient's weight, height, BMI, and visual acuity have been recorded in the chart. I have made referrals, counseling, and provided education to the patient based on review of the above and I have provided the patient with a written personalized care plan for preventive services.      Review of Systems     Objective:   Physical Exam BP 142/82 mmHg  Pulse 75  Resp 16  Ht 5'  2" (1.575 m)  Wt 150 lb 12.8 oz (68.402 kg)  BMI 27.57 kg/m2  SpO2 98%        Assessment & Plan:  Medicare annual wellness visit, initial Annual exam as documented. Counseling done  re healthy lifestyle involving commitment to 150 minutes exercise per week, heart healthy diet, and attaining healthy weight.The importance of adequate sleep also discussed. Regular seat belt use and home safety, is also discussed. Changes in health  habits are decided on by the patient with goals and time frames  set for achieving them. Immunization and cancer screening needs are specifically addressed at this visit.   Need for prophylactic vaccination and inoculation against influenza Vaccine administered at visit.

## 2014-03-16 NOTE — Assessment & Plan Note (Signed)

## 2014-04-30 ENCOUNTER — Encounter: Payer: Self-pay | Admitting: Family Medicine

## 2014-06-30 ENCOUNTER — Encounter: Payer: Medicare HMO | Admitting: Family Medicine

## 2014-07-18 ENCOUNTER — Other Ambulatory Visit: Payer: Self-pay | Admitting: Family Medicine

## 2014-07-27 ENCOUNTER — Other Ambulatory Visit: Payer: Self-pay | Admitting: Family Medicine

## 2014-08-01 ENCOUNTER — Other Ambulatory Visit: Payer: Self-pay | Admitting: Family Medicine

## 2014-08-07 ENCOUNTER — Other Ambulatory Visit: Payer: Self-pay | Admitting: Family Medicine

## 2014-08-08 ENCOUNTER — Other Ambulatory Visit: Payer: Self-pay | Admitting: Family Medicine

## 2014-10-06 ENCOUNTER — Other Ambulatory Visit: Payer: Self-pay | Admitting: Family Medicine

## 2014-10-06 LAB — COMPREHENSIVE METABOLIC PANEL
ALBUMIN: 4.2 g/dL (ref 3.5–5.2)
ALK PHOS: 80 U/L (ref 39–117)
ALT: 18 U/L (ref 0–35)
AST: 19 U/L (ref 0–37)
BUN: 11 mg/dL (ref 6–23)
CALCIUM: 9.5 mg/dL (ref 8.4–10.5)
CHLORIDE: 104 meq/L (ref 96–112)
CO2: 28 mEq/L (ref 19–32)
Creat: 0.79 mg/dL (ref 0.50–1.10)
Glucose, Bld: 107 mg/dL — ABNORMAL HIGH (ref 70–99)
POTASSIUM: 4.5 meq/L (ref 3.5–5.3)
SODIUM: 141 meq/L (ref 135–145)
TOTAL PROTEIN: 6.9 g/dL (ref 6.0–8.3)
Total Bilirubin: 0.4 mg/dL (ref 0.2–1.2)

## 2014-10-06 LAB — HEMOGLOBIN A1C
Hgb A1c MFr Bld: 5.8 % — ABNORMAL HIGH (ref <5.7)
MEAN PLASMA GLUCOSE: 120 mg/dL — AB (ref <117)

## 2014-10-06 LAB — LIPID PANEL
CHOLESTEROL: 240 mg/dL — AB (ref 0–200)
HDL: 51 mg/dL (ref >=46)
LDL CALC: 170 mg/dL — AB (ref 0–99)
TRIGLYCERIDES: 93 mg/dL (ref <150)
Total CHOL/HDL Ratio: 4.7 Ratio
VLDL: 19 mg/dL (ref 0–40)

## 2014-10-19 ENCOUNTER — Telehealth: Payer: Self-pay

## 2014-10-19 DIAGNOSIS — M25562 Pain in left knee: Secondary | ICD-10-CM

## 2014-10-19 MED ORDER — PREDNISONE 5 MG (21) PO TBPK: ORAL_TABLET | ORAL | Status: DC

## 2014-10-19 NOTE — Addendum Note (Signed)
Addended by: Kandis Fantasia B on: 10/19/2014 04:25 PM   Modules accepted: Orders

## 2014-10-19 NOTE — Telephone Encounter (Signed)
Pls send in pred 5 mg dose pack x 6 days and refer as requested I will sign

## 2014-10-19 NOTE — Addendum Note (Signed)
Addended by: Kandis Fantasia B on: 10/19/2014 04:23 PM   Modules accepted: Orders

## 2014-10-19 NOTE — Telephone Encounter (Signed)
Patient aware and med sent  

## 2014-11-03 ENCOUNTER — Encounter: Payer: Self-pay | Admitting: Orthopedic Surgery

## 2014-11-03 ENCOUNTER — Ambulatory Visit (INDEPENDENT_AMBULATORY_CARE_PROVIDER_SITE_OTHER): Payer: Medicare HMO | Admitting: Orthopedic Surgery

## 2014-11-03 VITALS — BP 134/74 | Ht 62.0 in | Wt 144.0 lb

## 2014-11-03 DIAGNOSIS — S83242A Other tear of medial meniscus, current injury, left knee, initial encounter: Secondary | ICD-10-CM | POA: Diagnosis not present

## 2014-11-03 NOTE — Patient Instructions (Signed)
We will schedule MRI for you and call you with appt. 

## 2014-11-03 NOTE — Progress Notes (Signed)
New  Patient ID: Colleen [name], female   DOB: 1946-02-24, 69 y.o.   MRN: [id number] New problem no patient   Chief Complaint  Patient presents with  . Knee Pain    Left knee pain, no injury. Referred by Dr. Lodema Hong.     Colleen [name] is a 69 y.o. female.   HPI 69 years old age medical onset of medial knee pain 1 month. She complains of sharp burning pain over the medial aspect of her right knee joint associated with pain and swelling and decreased flexion of the knee. X-ray show mild arthritis  She was treated with prednisone Dosepak nabumetone 750 mg 1 month she also tried ice and heat. Her symptoms are worse after sitting for long periods of time  Review of systems dental problems vision problems bladder control problem abdominal pain at times swollen joints at times otherwise normal Review of Systems See hpi  Past Medical History  Diagnosis Date  . Hypertension   . Hyperlipidemia   . Glaucoma   . GERD (gastroesophageal reflux disease)     Past Surgical History  Procedure Laterality Date  . Cholecystectomy      Family History  Problem Relation Age of Onset  . Hypertension Mother   . Heart disease Mother 67    MI  . Hypertension Father   . Diabetes Father   . Stroke Father   . Hypertension Sister   . Diabetes Sister   . Hypertension Sister   . Diabetes Sister     Social History History  Substance Use Topics  . Smoking status: Never Smoker   . Smokeless tobacco: Not on file  . Alcohol Use: No    Allergies  Allergen Reactions  . Aspirin     REACTION: nausea  . Statins Other (See Comments)    Muscle cramps    Current Outpatient Prescriptions  Medication Sig Dispense Refill  . alendronate (FOSAMAX) 70 MG tablet TAKE 1 TABLET BY MOUTH EVERY 7 DAYS WITH A FULL GLASS OF WATER ON AN EMPTY STOMACH 12 tablet 3  . Calcium Carbonate-Vit D-Min (CALCIUM 1200) 1200-1000 MG-UNIT CHEW Chew 1 tablet by mouth daily.    . Choline Fenofibrate (FENOFIBRIC ACID) 45 MG  CPDR TAKE 1 CAPSULE (45 MG TOTAL) BY MOUTH DAILY. 90 capsule 0  . fluticasone (FLONASE) 50 MCG/ACT nasal spray Place 2 sprays into both nostrils daily. 15 g 2  . ibuprofen (ADVIL,MOTRIN) 200 MG tablet Take 200 mg by mouth every 6 (six) hours as needed.    . latanoprost (XALATAN) 0.005 % ophthalmic solution 1 drop at bedtime.    Marland Kitchen lisinopril-hydrochlorothiazide (PRINZIDE,ZESTORETIC) 20-25 MG per tablet TAKE 1 TABLET BY MOUTH EVERY DAY 90 tablet 0  . omeprazole (PRILOSEC) 20 MG capsule TAKE ONE CAPSULE BY MOUTH EVERY DAY 90 capsule 2  . Ascorbic Acid (VITAMIN C) 1000 MG tablet Take 1,000 mg by mouth daily.    Marland Kitchen lisinopril-hydrochlorothiazide (PRINZIDE,ZESTORETIC) 20-25 MG per tablet TAKE 1 TABLET BY MOUTH EVERY DAY 90 tablet 0  . Lutein 20 MG CAPS Take 1 capsule by mouth daily.    . Multiple Vitamin (MULTIVITAMIN) tablet Take 1 tablet by mouth daily.    . predniSONE (STERAPRED UNI-PAK 21 TAB) 5 MG (21) TBPK tablet As directed on package 21 tablet 0  . Propylene Glycol (SYSTANE BALANCE OP) Apply 1 drop to eye daily.     No current facility-administered medications for this visit.       Physical Exam Blood pressure 134/74, height  5\' 2"  (1.575 m), weight 144 lb (65.318 kg). Physical Exam The patient is well developed well nourished and well groomed. Orientation to person place and time is normal  Mood is pleasant. Ambulatory status she is ambulating with a slight favoring of that left knee. 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  . Medial meniscus tear, left, initial encounter Yes    Plan Recommend MRI. I told her she'll need surgery most likely

## 2014-11-12 ENCOUNTER — Encounter: Payer: Self-pay | Admitting: Family Medicine

## 2014-11-12 ENCOUNTER — Ambulatory Visit (INDEPENDENT_AMBULATORY_CARE_PROVIDER_SITE_OTHER): Payer: Medicare HMO | Admitting: Family Medicine

## 2014-11-12 VITALS — BP 130/74 | HR 72 | Resp 18 | Ht 62.0 in | Wt 144.0 lb

## 2014-11-12 DIAGNOSIS — I10 Essential (primary) hypertension: Secondary | ICD-10-CM

## 2014-11-12 DIAGNOSIS — E785 Hyperlipidemia, unspecified: Secondary | ICD-10-CM | POA: Diagnosis not present

## 2014-11-12 DIAGNOSIS — Z23 Encounter for immunization: Secondary | ICD-10-CM | POA: Diagnosis not present

## 2014-11-12 DIAGNOSIS — M25562 Pain in left knee: Secondary | ICD-10-CM | POA: Diagnosis not present

## 2014-11-12 DIAGNOSIS — R7301 Impaired fasting glucose: Secondary | ICD-10-CM

## 2014-11-12 DIAGNOSIS — E559 Vitamin D deficiency, unspecified: Secondary | ICD-10-CM

## 2014-11-12 DIAGNOSIS — Z1211 Encounter for screening for malignant neoplasm of colon: Secondary | ICD-10-CM | POA: Diagnosis not present

## 2014-11-12 DIAGNOSIS — Z0000 Encounter for general adult medical examination without abnormal findings: Secondary | ICD-10-CM | POA: Diagnosis not present

## 2014-11-12 LAB — HEMOCCULT GUIAC POC 1CARD (OFFICE): Fecal Occult Blood, POC: NEGATIVE

## 2014-11-12 MED ORDER — TRAMADOL HCL 50 MG PO TABS: 50.0000 mg | ORAL_TABLET | Freq: Every evening | ORAL | Status: DC | PRN

## 2014-11-12 MED ORDER — KETOROLAC TROMETHAMINE 60 MG/2ML IM SOLN
60.0000 mg | Freq: Once | INTRAMUSCULAR | Status: AC
  Administered 2014-11-12: 60 mg via INTRAMUSCULAR

## 2014-11-12 MED ORDER — FENOFIBRATE 145 MG PO TABS: 145.0000 mg | ORAL_TABLET | Freq: Every day | ORAL | Status: DC

## 2014-11-12 MED ORDER — METHYLPREDNISOLONE ACETATE 80 MG/ML IJ SUSP
80.0000 mg | Freq: Once | INTRAMUSCULAR | Status: AC
  Administered 2014-11-12: 80 mg via INTRAMUSCULAR

## 2014-11-12 MED ORDER — PREDNISONE 5 MG PO TABS: 5.0000 mg | ORAL_TABLET | Freq: Two times a day (BID) | ORAL | Status: DC

## 2014-11-12 NOTE — Patient Instructions (Addendum)
F/u in 3.5 month, call if you need me before  Toradol and depo medrol in office, and 5 day course of prednisone sent in  [place] today  Handicap sticker today  Cholesterol is too high, start higher dose of fenofibrate which is sent in  Tramadol 1 at bedtime for pain and tylenol 500 mg one up to twice  daily, use alleve sparingly please  Please work on good  health habits so that your health will improve. 1. Commitment to daily physical activity for 30 to 60  minutes, if you are able to do this.  2. Commitment to wise food choices. Aim for half of your  food intake to be vegetable and fruit, one quarter starchy foods, and one quarter protein. Try to eat on a regular schedule  3 meals per day, snacking between meals should be limited to vegetables or fruits or small portions of nuts. 64 ounces of water per day is generally recommended, unless you have specific health conditions, like heart failure or kidney failure where you will need to limit fluid intake.  3. Commitment to sufficient and a  good quality of physical and mental rest daily, generally between 6 to 8 hours per day.  WITH PERSISTANCE AND PERSEVERANCE, THE IMPOSSIBLE , BECOMES THE NORM! Thanks for choosing [facility], we consider it a privelige to serve you.   Fasting lipid, cmp and EGFR, HBa1C, CBC, TSH and Vit D Oct 28 or after

## 2014-11-15 DIAGNOSIS — M25562 Pain in left knee: Secondary | ICD-10-CM | POA: Insufficient documentation

## 2014-11-15 DIAGNOSIS — Z23 Encounter for immunization: Secondary | ICD-10-CM | POA: Insufficient documentation

## 2014-11-15 DIAGNOSIS — Z0000 Encounter for general adult medical examination without abnormal findings: Secondary | ICD-10-CM | POA: Insufficient documentation

## 2014-11-15 NOTE — Progress Notes (Signed)
   Subjective:    Patient ID: Colleen [name], female    DOB: November 02, 1945, 69 y.o.   MRN: [id number]  HPI Patient is in for annual physical exam. C/o ongoing left knee pain and instability following injury several  Weeks ago Recent labs, if available are reviewed. Immunization is reviewed , and  updated if needed.    Review of Systems See HPI     Objective:   Physical Exam BP 130/74 mmHg  Pulse 72  Resp 18  Ht 5\' 2"  (1.575 m)  Wt 144 lb (65.318 kg)  BMI 26.33 kg/m2  SpO2 97%   Pleasant well nourished female, alert and oriented x 3, in no cardio-pulmonary distress. Afebrile. HEENT No facial trauma or asymetry. Sinuses non tender.  Extra occullar muscles intact, pupils equally reactive to light. External ears normal, tympanic membranes clear. Oropharynx moist, no exudate, fairly  good dentition. Neck: supple, no adenopathy,JVD or thyromegaly.No bruits.  Chest: Clear to ascultation bilaterally.No crackles or wheezes. Non tender to palpation  Breast: No asymetry,no masses or lumps. No tenderness. No nipple discharge or inversion. No axillary or supraclavicular adenopathy  Cardiovascular system; Heart sounds normal,  S1 and  S2 ,no S3.  No murmur, or thrill. Apical beat not displaced Peripheral pulses normal.  Abdomen: Soft, non tender, no organomegaly or masses. No bruits. Bowel sounds normal. No guarding, tenderness or rebound.  Rectal:  Normal sphincter tone. No mass.No rectal masses.  Guaiac negative stool.  GU: External genitalia normal female genitalia , female distribution of hair. No lesions. Urethral meatus normal in size, no  Prolapse, no lesions visibly  Present. Bladder non tender. Vagina pink and moist , with no visible lesions , discharge present . Adequate pelvic support no  cystocele or rectocele noted Cervix pink and appears healthy, no lesions or ulcerations noted, no discharge noted from os Uterus normal size, no adnexal masses, no cervical  motion or adnexal tenderness.   Musculoskeletal exam: Full ROM of spine, hips , shoulders and right  Knee, swelling and reduced mobility in left knee.  No muscle wasting or atrophy.   Neurologic: Cranial nerves 2 to 12 intact. Power, tone ,sensation and reflexes normal throughout. No disturbance in gait. No tremor.  Skin: Intact, no ulceration, erythema , scaling or rash noted. Pigmentation normal throughout  Psych; Normal mood and affect. Judgement and concentration normal        Assessment & Plan:  Annual physical exam Annual exam as documented. Counseling done  re healthy lifestyle involving commitment to 150 minutes exercise per week, heart healthy diet, and attaining healthy weight.The importance of adequate sleep also discussed. Regular seat belt use and home safety, is also discussed. Changes in health habits are decided on by the patient with goals and time frames  set for achieving them. Immunization and cancer screening needs are specifically addressed at this visit.   Left knee pain Uncontrolled.Toradol and depo medrol administered IM in the office , to be followed by a short course of oral prednisone and NSAIDS.   Need for vaccination with 13-polyvalent pneumococcal conjugate vaccine After obtaining informed consent, the vaccine is  administered by LPN.

## 2014-11-15 NOTE — Assessment & Plan Note (Signed)
Uncontrolled.Toradol and depo medrol administered IM in the office , to be followed by a short course of oral prednisone and NSAIDS.  

## 2014-11-15 NOTE — Assessment & Plan Note (Signed)

## 2014-11-15 NOTE — Assessment & Plan Note (Signed)
After obtaining informed consent, the vaccine is  administered by LPN.  

## 2014-11-16 ENCOUNTER — Telehealth: Payer: Self-pay | Admitting: *Deleted

## 2014-11-16 NOTE — Telephone Encounter (Signed)
Pt called wanting to know if she needs a mammogram, pt states she thinks Dr. Lodema HongSimpson wanted her to have one. Please advise

## 2014-11-17 ENCOUNTER — Other Ambulatory Visit: Payer: Self-pay | Admitting: Family Medicine

## 2014-11-17 DIAGNOSIS — Z1231 Encounter for screening mammogram for malignant neoplasm of breast: Secondary | ICD-10-CM

## 2014-11-17 NOTE — Telephone Encounter (Signed)
Has already scheduled her appt

## 2014-11-18 ENCOUNTER — Ambulatory Visit (HOSPITAL_COMMUNITY)
Admission: RE | Admit: 2014-11-18 | Discharge: 2014-11-18 | Disposition: A | Discharge status: Home / Self Care | Payer: Medicare HMO | Source: Ambulatory Visit | Attending: Orthopedic Surgery | Admitting: Orthopedic Surgery

## 2014-11-18 DIAGNOSIS — M71562 Other bursitis, not elsewhere classified, left knee: Secondary | ICD-10-CM | POA: Diagnosis not present

## 2014-11-18 DIAGNOSIS — S83242A Other tear of medial meniscus, current injury, left knee, initial encounter: Secondary | ICD-10-CM | POA: Diagnosis not present

## 2014-11-18 DIAGNOSIS — X58XXXA Exposure to other specified factors, initial encounter: Secondary | ICD-10-CM | POA: Diagnosis not present

## 2014-11-18 DIAGNOSIS — M25562 Pain in left knee: Secondary | ICD-10-CM | POA: Diagnosis present

## 2014-11-19 ENCOUNTER — Telehealth: Payer: Self-pay | Admitting: Orthopedic Surgery

## 2014-11-19 NOTE — Telephone Encounter (Signed)
AUG 3 SURGERY

## 2014-11-19 NOTE — Telephone Encounter (Signed)
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    MENISCI   Medial meniscus: There is a chronic posterior meniscocapsular separation, now with secondary medial and pes anserine bursitis since fluid extends from this meniscocapsular separation in the joint into those bursal spaces. There is a new horizontal tear of the posterior horn. There is also a tear of the free edge of the posterior horn with a component of the torn meniscus flipped into the lateral gutter below the midbody best seen on images 8 and 9 of series 8.  Medial: Progressive thinning and irregularity of the articular cartilage of the medial femoral condyle. IMPRESSION: 1. New horizontal tear of the posterior horn of the medial meniscus with a torn free edge flipped into the inferior gutter below the midbody. 2. New pes anserine and medial bursitis secondary to chronic meniscocapsular separation posteriorly in the medial compartment. 3. Slight progression of tricompartmental degenerative changes.

## 2014-11-20 ENCOUNTER — Other Ambulatory Visit: Payer: Self-pay | Admitting: *Deleted

## 2014-11-24 ENCOUNTER — Ambulatory Visit: Payer: Medicare HMO | Admitting: Orthopedic Surgery

## 2014-11-27 ENCOUNTER — Other Ambulatory Visit: Payer: Self-pay | Admitting: Family Medicine

## 2014-12-02 ENCOUNTER — Telehealth: Payer: Self-pay | Admitting: Orthopedic Surgery

## 2014-12-02 NOTE — Telephone Encounter (Signed)
Regarding out-patient surgery scheduled at [facility], 12/11/14, CPT 29881, 29880, ICD-10 [phone] and M25.562, per online [employer] portal, these codes do not require pre-authorization for in-network providers. Also called insurer to verify.  Per automated voice response system: Ref# [id number] for [id number]; Same Ref# [id number] for [id number].

## 2014-12-04 NOTE — Patient Instructions (Addendum)
Colleen [name]  12/04/2014     @   Your procedure is scheduled on  12/11/2014   Report to [facility] at 8:45 AM  Call this number if you have problems the morning of surgery:  [phone]   Remember:  Do not eat food or drink liquids after midnight.  Take these medicines the morning of surgery with A SIP OF WATER  Lisinopril, prilosec.   Do not wear jewelry, make-up or nail polish.  Do not wear lotions, powders, or perfumes.   Do not shave 48 hours prior to surgery.  Men may shave face and neck.  Do not bring valuables to the hospital.  [facility] is not responsible for any belongings or valuables.  Contacts, dentures or bridgework may not be worn into surgery.  Leave your suitcase in the car.  After surgery it may be brought to your room.  For patients admitted to the hospital, discharge time will be determined by your treatment team.  Patients discharged the day of surgery will not be allowed to drive home.   Name and phone number of your driver:   family Special instructions:  none  Please read over the following fact sheets that you were given. Pain Booklet, Coughing and Deep Breathing, Surgical Site Infection Prevention, Anesthesia Post-op Instructions and Care and Recovery After Surgery      Meniscus Injury, Arthroscopy Arthroscopy is a surgical procedure that involves the use of a small scope that has a camera and surgical instruments on the end (arthroscope). An arthroscope can be used to repair your meniscus injury.  LET [facility] CARE PROVIDER KNOW ABOUT:  Any allergies you have.  All medicines you are taking, including vitamins, herbs, eyedrops, creams, and over-the-counter medicines.  Any recent colds or infections you have had or currently have.  Previous problems you or members of your family have had with the use of anesthetics.  Any blood disorders or blood clotting problems you have.  Previous surgeries you have  had.  Medical conditions you have. RISKS AND COMPLICATIONS Generally, this is a safe procedure. However, as with any procedure, problems can occur. Possible problems include:  Damage to nerves or blood vessels.  Excess bleeding.  Blood clots.  Infection. BEFORE THE PROCEDURE  Do not eat or drink for 6-8 hours before the procedure.  Take medicines as directed by your surgeon. Ask your surgeon about changing or stopping your regular medicines.  You may have lab tests the morning of surgery. PROCEDURE  You will be given one of the following:   A medicine that numbs the area (local anesthesia).  A medicine that makes you go to sleep (general anesthesia).  A medicine injected into your spine that numbs your body below the waist (spinal anesthesia). Most often, several small cuts (incisions) are made in the knee. The arthroscope and instruments go into the incisions to repair the damage. The torn portion of the meniscus is removed.  During this time, your surgeon may find a partial or complete tear in a cruciate ligament, such as the anterior cruciate ligament (ACL). A completely torn cruciate ligament is reconstructed by taking tissue from another part of the body (grafting) and placing it into the injured area. This requires several larger incisions to complete the repair. Sometimes, open surgery is needed for collateral ligament injuries. If a collateral ligament is found to be injured, your surgeon may staple or suture the tear through a slightly larger incision  on the side of the knee. AFTER THE PROCEDURE You will be taken to the recovery area where your progress will be monitored. When you are awake, stable, and taking fluids without complications, you will be allowed to go home. This is usually the same day. However, more extensive repairs of a ligament may require an overnight stay.  The recovery time after repairing your meniscus or ligament depends on the amount of damage to these  structures. It also depends on whether or not reconstructive knee surgery was needed.   A torn or stretched ligament (ligament sprain) may take 6-8 weeks to heal. It takes about the same amount of time if your surgeon removed a torn meniscus.  A repaired meniscus may require 6-12 weeks of recovery time.  A torn ligament needing reconstructive surgery may take 6-12 months to heal fully. Document Released: 04/21/2000 Document Revised: 04/29/2013 Document Reviewed: 09/20/2012 [facility] Patient Information 2015 [place], [place]. This information is not intended to replace advice given to you by your health care provider. Make sure you discuss any questions you have with your health care provider. PATIENT INSTRUCTIONS POST-ANESTHESIA  IMMEDIATELY FOLLOWING SURGERY:  Do not drive or operate machinery for the first twenty four hours after surgery.  Do not make any important decisions for twenty four hours after surgery or while taking narcotic pain medications or sedatives.  If you develop intractable nausea and vomiting or a severe headache please notify your doctor immediately.  FOLLOW-UP:  Please make an appointment with your surgeon as instructed. You do not need to follow up with anesthesia unless specifically instructed to do so.  WOUND CARE INSTRUCTIONS (if applicable):  Keep a dry clean dressing on the anesthesia/puncture wound site if there is drainage.  Once the wound has quit draining you may leave it open to air.  Generally you should leave the bandage intact for twenty four hours unless there is drainage.  If the epidural site drains for more than 36-48 hours please call the anesthesia department.  QUESTIONS?:  Please feel free to call your physician or the hospital operator if you have any questions, and they will be happy to assist you.

## 2014-12-08 ENCOUNTER — Encounter (HOSPITAL_COMMUNITY): Payer: Self-pay

## 2014-12-08 ENCOUNTER — Encounter (HOSPITAL_COMMUNITY)
Admission: RE | Admit: 2014-12-08 | Discharge: 2014-12-08 | Disposition: A | Discharge status: Home / Self Care | Payer: Medicare HMO | Source: Ambulatory Visit | Attending: Orthopedic Surgery | Admitting: Orthopedic Surgery

## 2014-12-08 ENCOUNTER — Other Ambulatory Visit: Payer: Self-pay

## 2014-12-08 DIAGNOSIS — X58XXXA Exposure to other specified factors, initial encounter: Secondary | ICD-10-CM | POA: Diagnosis not present

## 2014-12-08 DIAGNOSIS — K219 Gastro-esophageal reflux disease without esophagitis: Secondary | ICD-10-CM | POA: Insufficient documentation

## 2014-12-08 DIAGNOSIS — S83242A Other tear of medial meniscus, current injury, left knee, initial encounter: Secondary | ICD-10-CM | POA: Insufficient documentation

## 2014-12-08 DIAGNOSIS — S83282A Other tear of lateral meniscus, current injury, left knee, initial encounter: Secondary | ICD-10-CM | POA: Diagnosis not present

## 2014-12-08 DIAGNOSIS — I10 Essential (primary) hypertension: Secondary | ICD-10-CM | POA: Insufficient documentation

## 2014-12-08 DIAGNOSIS — E785 Hyperlipidemia, unspecified: Secondary | ICD-10-CM | POA: Insufficient documentation

## 2014-12-08 DIAGNOSIS — Z79899 Other long term (current) drug therapy: Secondary | ICD-10-CM | POA: Insufficient documentation

## 2014-12-08 LAB — CBC
HEMATOCRIT: 37.7 % (ref 36.0–46.0)
Hemoglobin: 13 g/dL (ref 12.0–15.0)
MCH: 30.1 pg (ref 26.0–34.0)
MCHC: 34.5 g/dL (ref 30.0–36.0)
MCV: 87.3 fL (ref 78.0–100.0)
PLATELETS: 261 10*3/uL (ref 150–400)
RBC: 4.32 MIL/uL (ref 3.87–5.11)
RDW: 12.7 % (ref 11.5–15.5)
WBC: 5.1 10*3/uL (ref 4.0–10.5)

## 2014-12-08 LAB — BASIC METABOLIC PANEL
ANION GAP: 8 (ref 5–15)
BUN: 16 mg/dL (ref 6–20)
CALCIUM: 9.7 mg/dL (ref 8.9–10.3)
CHLORIDE: 103 mmol/L (ref 101–111)
CO2: 27 mmol/L (ref 22–32)
CREATININE: 0.7 mg/dL (ref 0.44–1.00)
GFR calc Af Amer: >60 mL/min (ref >60)
GFR calc non Af Amer: >60 mL/min (ref >60)
GLUCOSE: 82 mg/dL (ref 65–99)
Potassium: 3.8 mmol/L (ref 3.5–5.1)
SODIUM: 138 mmol/L (ref 135–145)

## 2014-12-08 NOTE — H&P (Signed)
New  Patient ID: Colleen [name], female   DOB: 04/07/1946, 69 y.o.   MRN: [id number] New problem no patient     Chief Complaint   Patient presents with   .  Knee Pain       Left knee pain, no injury. Referred by Dr. Lodema Hong.      Colleen [name] is a 69 y.o. female.   HPI 69 years old age medical onset of medial knee pain 1 month. She complains of sharp burning pain over the medial aspect of her right knee joint associated with pain and swelling and decreased flexion of the knee. X-ray show mild arthritis  She was treated with prednisone Dosepak nabumetone 750 mg 1 month she also tried ice and heat. Her symptoms are worse after sitting for long periods of time  Review of systems dental problems vision problems bladder control problem abdominal pain at times swollen joints at times otherwise normal Review of Systems See hpi    Past Medical History   Diagnosis  Date   .  Hypertension     .  Hyperlipidemia     .  Glaucoma     .  GERD (gastroesophageal reflux disease)         Past Surgical History   Procedure  Laterality  Date   .  Cholecystectomy           Family History   Problem  Relation  Age of Onset   .  Hypertension  Mother     .  Heart disease  Mother  44       MI   .  Hypertension  Father     .  Diabetes  Father     .  Stroke  Father     .  Hypertension  Sister     .  Diabetes  Sister     .  Hypertension  Sister     .  Diabetes  Sister       Social History History   Substance Use Topics   .  Smoking status:  Never Smoker    .  Smokeless tobacco:  Not on file   .  Alcohol Use:  No       Allergies   Allergen  Reactions   .  Aspirin         REACTION: nausea   .  Statins  Other (See Comments)       Muscle cramps       Current Outpatient Prescriptions   Medication  Sig  Dispense  Refill   .  alendronate (FOSAMAX) 70 MG tablet  TAKE 1 TABLET BY MOUTH EVERY 7 DAYS WITH A FULL GLASS OF WATER ON AN EMPTY STOMACH  12 tablet  3   .  Calcium Carbonate-Vit  D-Min (CALCIUM 1200) 1200-1000 MG-UNIT CHEW  Chew 1 tablet by mouth daily.       .  Choline Fenofibrate (FENOFIBRIC ACID) 45 MG CPDR  TAKE 1 CAPSULE (45 MG TOTAL) BY MOUTH DAILY.  90 capsule  0   .  fluticasone (FLONASE) 50 MCG/ACT nasal spray  Place 2 sprays into both nostrils daily.  15 g  2   .  ibuprofen (ADVIL,MOTRIN) 200 MG tablet  Take 200 mg by mouth every 6 (six) hours as needed.       .  latanoprost (XALATAN) 0.005 % ophthalmic solution  1 drop at bedtime.       Marland Kitchen  lisinopril-hydrochlorothiazide (PRINZIDE,ZESTORETIC) 20-25 MG  per tablet  TAKE 1 TABLET BY MOUTH EVERY DAY  90 tablet  0   .  omeprazole (PRILOSEC) 20 MG capsule  TAKE ONE CAPSULE BY MOUTH EVERY DAY  90 capsule  2   .  Ascorbic Acid (VITAMIN C) 1000 MG tablet  Take 1,000 mg by mouth daily.       Marland Kitchen  lisinopril-hydrochlorothiazide (PRINZIDE,ZESTORETIC) 20-25 MG per tablet  TAKE 1 TABLET BY MOUTH EVERY DAY  90 tablet  0   .  Lutein 20 MG CAPS  Take 1 capsule by mouth daily.       .  Multiple Vitamin (MULTIVITAMIN) tablet  Take 1 tablet by mouth daily.       .  predniSONE (STERAPRED UNI-PAK 21 TAB) 5 MG (21) TBPK tablet  As directed on package  21 tablet  0   .  Propylene Glycol (SYSTANE BALANCE OP)  Apply 1 drop to eye daily.          No current facility-administered medications for this visit.        Physical Exam Blood pressure 134/74, height  (1.575 m), weight 144 lb (65.318 kg). Physical Exam The patient is well developed well nourished and well groomed. Orientation to person place and time is normal   Mood is pleasant. Ambulatory status she is ambulating with a slight favoring of that left knee. She has medial joint line tenderness effusion flexion is limited at 100 stability tests were normal motor exam is intact skin was normal sensation was normal pulses were good The opposite knee was examined for comparison full range of motion no instability no tenderness  Data Reviewed X-ray review with report this  x-ray was on a disc and included 3 views of the knee left knee the read as moderate arthritis left knee in the patellofemoral joint to me it's more mild arthritis tibiofemoral joint moderate patellofemoral joint  In any event the left at the she does have a positive McMurray sign for torn medial meniscus positive screw home test for torn medial meniscus and positive joint line tenderness for torn medial meniscus  Assessment Encounter Diagnosis   Name  Primary?   .  Medial meniscus tear, left, initial encounter  Yes     Plan arthroscopy left knee partial medial meniscectomy. The patient understands that the arthritis cannot be fully addressed with arthroscopic surgery and she may have continued symptoms from arthritis.  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   Medial: Progressive thinning and irregularity of the articular cartilage of the medial femoral condyle.

## 2014-12-11 ENCOUNTER — Encounter (HOSPITAL_COMMUNITY): Payer: Self-pay | Admitting: *Deleted

## 2014-12-11 ENCOUNTER — Ambulatory Visit (HOSPITAL_COMMUNITY): Payer: Medicare HMO | Admitting: Anesthesiology

## 2014-12-11 ENCOUNTER — Encounter (HOSPITAL_COMMUNITY)
Admission: RE | Disposition: A | Discharge status: Home / Self Care | Payer: Self-pay | Source: Ambulatory Visit | Attending: Orthopedic Surgery

## 2014-12-11 ENCOUNTER — Ambulatory Visit (HOSPITAL_COMMUNITY)
Admission: RE | Admit: 2014-12-11 | Discharge: 2014-12-11 | Disposition: A | Discharge status: Home / Self Care | Payer: Medicare HMO | Source: Ambulatory Visit | Attending: Orthopedic Surgery | Admitting: Orthopedic Surgery

## 2014-12-11 DIAGNOSIS — M23302 Other meniscus derangements, unspecified lateral meniscus, unspecified knee: Secondary | ICD-10-CM | POA: Insufficient documentation

## 2014-12-11 DIAGNOSIS — E785 Hyperlipidemia, unspecified: Secondary | ICD-10-CM | POA: Diagnosis not present

## 2014-12-11 DIAGNOSIS — S83242A Other tear of medial meniscus, current injury, left knee, initial encounter: Secondary | ICD-10-CM | POA: Diagnosis not present

## 2014-12-11 DIAGNOSIS — M23307 Other meniscus derangements, unspecified meniscus, left knee: Secondary | ICD-10-CM

## 2014-12-11 DIAGNOSIS — M23301 Other meniscus derangements, unspecified lateral meniscus, left knee: Secondary | ICD-10-CM

## 2014-12-11 DIAGNOSIS — M25562 Pain in left knee: Secondary | ICD-10-CM

## 2014-12-11 DIAGNOSIS — M23304 Other meniscus derangements, unspecified medial meniscus, left knee: Secondary | ICD-10-CM

## 2014-12-11 DIAGNOSIS — M23329 Other meniscus derangements, posterior horn of medial meniscus, unspecified knee: Secondary | ICD-10-CM | POA: Insufficient documentation

## 2014-12-11 DIAGNOSIS — M23322 Other meniscus derangements, posterior horn of medial meniscus, left knee: Secondary | ICD-10-CM

## 2014-12-11 DIAGNOSIS — M1712 Unilateral primary osteoarthritis, left knee: Secondary | ICD-10-CM | POA: Insufficient documentation

## 2014-12-11 DIAGNOSIS — I10 Essential (primary) hypertension: Secondary | ICD-10-CM | POA: Diagnosis not present

## 2014-12-11 DIAGNOSIS — S83282A Other tear of lateral meniscus, current injury, left knee, initial encounter: Secondary | ICD-10-CM | POA: Diagnosis not present

## 2014-12-11 HISTORY — PX: CHONDROPLASTY: SHX5177

## 2014-12-11 HISTORY — PX: KNEE ARTHROSCOPY WITH LATERAL MENISECTOMY: SHX6193

## 2014-12-11 SURGERY — ARTHROSCOPY, KNEE, WITH LATERAL MENISCECTOMY
Anesthesia: General | Site: Knee | Laterality: Left

## 2014-12-11 MED ORDER — FENTANYL CITRATE (PF) 100 MCG/2ML IJ SOLN
INTRAMUSCULAR | Status: DC | PRN
  Administered 2014-12-11 (×3): 25 ug via INTRAVENOUS
  Administered 2014-12-11: 50 ug via INTRAVENOUS
  Administered 2014-12-11: 25 ug via INTRAVENOUS

## 2014-12-11 MED ORDER — HYDROCODONE-ACETAMINOPHEN 5-325 MG PO TABS
2.0000 | ORAL_TABLET | Freq: Once | ORAL | Status: AC
  Administered 2014-12-11: 2 via ORAL
  Filled 2014-12-11: qty 2

## 2014-12-11 MED ORDER — FENTANYL CITRATE (PF) 100 MCG/2ML IJ SOLN
INTRAMUSCULAR | Status: AC
  Filled 2014-12-11: qty 4

## 2014-12-11 MED ORDER — PREGABALIN 50 MG PO CAPS
50.0000 mg | ORAL_CAPSULE | Freq: Once | ORAL | Status: AC
  Administered 2014-12-11: 50 mg via ORAL
  Filled 2014-12-11: qty 1

## 2014-12-11 MED ORDER — ONDANSETRON HCL 4 MG/2ML IJ SOLN
4.0000 mg | Freq: Once | INTRAMUSCULAR | Status: AC
  Administered 2014-12-11: 4 mg via INTRAVENOUS

## 2014-12-11 MED ORDER — FENTANYL CITRATE (PF) 100 MCG/2ML IJ SOLN
25.0000 ug | INTRAMUSCULAR | Status: AC
  Administered 2014-12-11 (×2): 25 ug via INTRAVENOUS

## 2014-12-11 MED ORDER — FENTANYL CITRATE (PF) 100 MCG/2ML IJ SOLN
INTRAMUSCULAR | Status: AC
  Filled 2014-12-11: qty 2

## 2014-12-11 MED ORDER — EPINEPHRINE HCL 1 MG/ML IJ SOLN
INTRAMUSCULAR | Status: AC
  Filled 2014-12-11: qty 5

## 2014-12-11 MED ORDER — BUPIVACAINE-EPINEPHRINE (PF) 0.5% -1:200000 IJ SOLN
INTRAMUSCULAR | Status: DC | PRN
  Administered 2014-12-11: 60 mL via PERINEURAL

## 2014-12-11 MED ORDER — SUCCINYLCHOLINE CHLORIDE 20 MG/ML IJ SOLN
INTRAMUSCULAR | Status: AC
  Filled 2014-12-11: qty 1

## 2014-12-11 MED ORDER — IBUPROFEN 800 MG PO TABS: 800.0000 mg | ORAL_TABLET | Freq: Three times a day (TID) | ORAL | Status: DC

## 2014-12-11 MED ORDER — HYDROCODONE-ACETAMINOPHEN 7.5-325 MG PO TABS: 1.0000 | ORAL_TABLET | ORAL | Status: DC | PRN

## 2014-12-11 MED ORDER — ONDANSETRON HCL 4 MG/2ML IJ SOLN: 4.0000 mg | Freq: Once | INTRAMUSCULAR | Status: DC | PRN

## 2014-12-11 MED ORDER — DEXAMETHASONE SODIUM PHOSPHATE 4 MG/ML IJ SOLN
INTRAMUSCULAR | Status: AC
  Filled 2014-12-11: qty 1

## 2014-12-11 MED ORDER — BUPIVACAINE-EPINEPHRINE (PF) 0.5% -1:200000 IJ SOLN
INTRAMUSCULAR | Status: AC
  Filled 2014-12-11: qty 30

## 2014-12-11 MED ORDER — SODIUM CHLORIDE 0.9 % IR SOLN
Status: DC | PRN
  Administered 2014-12-11: 1000 mL

## 2014-12-11 MED ORDER — FENTANYL CITRATE (PF) 100 MCG/2ML IJ SOLN: 25.0000 ug | INTRAMUSCULAR | Status: DC | PRN

## 2014-12-11 MED ORDER — LIDOCAINE HCL (CARDIAC) 10 MG/ML IV SOLN
INTRAVENOUS | Status: DC | PRN
  Administered 2014-12-11: 50 mg via INTRAVENOUS

## 2014-12-11 MED ORDER — BUPIVACAINE-EPINEPHRINE (PF) 0.5% -1:200000 IJ SOLN
INTRAMUSCULAR | Status: AC
  Filled 2014-12-11: qty 60

## 2014-12-11 MED ORDER — PROPOFOL 10 MG/ML IV BOLUS
INTRAVENOUS | Status: DC | PRN
  Administered 2014-12-11: 20 mg via INTRAVENOUS
  Administered 2014-12-11: 30 mg via INTRAVENOUS
  Administered 2014-12-11: 150 mg via INTRAVENOUS

## 2014-12-11 MED ORDER — PROMETHAZINE HCL 12.5 MG PO TABS: 12.5000 mg | ORAL_TABLET | Freq: Four times a day (QID) | ORAL | Status: DC | PRN

## 2014-12-11 MED ORDER — MIDAZOLAM HCL 2 MG/2ML IJ SOLN
INTRAMUSCULAR | Status: AC
  Filled 2014-12-11: qty 2

## 2014-12-11 MED ORDER — CHLORHEXIDINE GLUCONATE 4 % EX LIQD: 60.0000 mL | Freq: Once | CUTANEOUS | Status: DC

## 2014-12-11 MED ORDER — ONDANSETRON HCL 4 MG/2ML IJ SOLN
4.0000 mg | Freq: Once | INTRAMUSCULAR | Status: AC
  Administered 2014-12-11: 4 mg via INTRAVENOUS
  Filled 2014-12-11: qty 2

## 2014-12-11 MED ORDER — LACTATED RINGERS IV SOLN
INTRAVENOUS | Status: DC
  Administered 2014-12-11: 10:00:00 via INTRAVENOUS

## 2014-12-11 MED ORDER — MIDAZOLAM HCL 2 MG/2ML IJ SOLN
1.0000 mg | INTRAMUSCULAR | Status: DC | PRN
  Administered 2014-12-11: 2 mg via INTRAVENOUS

## 2014-12-11 MED ORDER — LIDOCAINE HCL (PF) 1 % IJ SOLN
INTRAMUSCULAR | Status: AC
  Filled 2014-12-11: qty 5

## 2014-12-11 MED ORDER — DEXAMETHASONE SODIUM PHOSPHATE 4 MG/ML IJ SOLN
4.0000 mg | Freq: Once | INTRAMUSCULAR | Status: AC
  Administered 2014-12-11: 4 mg via INTRAVENOUS

## 2014-12-11 MED ORDER — SODIUM CHLORIDE 0.9 % IR SOLN
Status: DC | PRN
  Administered 2014-12-11: 3000 mL
  Administered 2014-12-11: 6000 mL

## 2014-12-11 MED ORDER — CEFAZOLIN SODIUM 1-5 GM-% IV SOLN
INTRAVENOUS | Status: AC
  Filled 2014-12-11: qty 50

## 2014-12-11 MED ORDER — PROPOFOL 10 MG/ML IV BOLUS
INTRAVENOUS | Status: AC
  Filled 2014-12-11: qty 20

## 2014-12-11 MED ORDER — CEFAZOLIN SODIUM-DEXTROSE 2-3 GM-% IV SOLR
2.0000 g | INTRAVENOUS | Status: AC
  Administered 2014-12-11: 2 g via INTRAVENOUS
  Filled 2014-12-11: qty 50

## 2014-12-11 MED ORDER — ONDANSETRON HCL 4 MG/2ML IJ SOLN
INTRAMUSCULAR | Status: AC
  Filled 2014-12-11: qty 2

## 2014-12-11 SURGICAL SUPPLY — 53 items
ARTHROWAND PARAGON T2 (SURGICAL WAND)
BAG HAMPER (MISCELLANEOUS) ×3 IMPLANT
BANDAGE ELASTIC 6 VELCRO NS (GAUZE/BANDAGES/DRESSINGS) ×3 IMPLANT
BLADE AGGRESSIVE PLUS 4.0 (BLADE) ×3 IMPLANT
BUR AGGRESSIVE PLUS 5.0 (BURR) ×3 IMPLANT
CHLORAPREP W/TINT 26ML (MISCELLANEOUS) ×6 IMPLANT
CLOTH BEACON ORANGE TIMEOUT ST (SAFETY) ×3 IMPLANT
COOLER CRYO IC GRAV AND TUBE (ORTHOPEDIC SUPPLIES) ×3 IMPLANT
CUFF CRYO KNEE LG 20X31 COOLER (ORTHOPEDIC SUPPLIES) IMPLANT
CUFF CRYO KNEE18X23 MED (MISCELLANEOUS) ×3 IMPLANT
CUFF TOURNIQUET SINGLE 34IN LL (TOURNIQUET CUFF) ×3 IMPLANT
CUFF TOURNIQUET SINGLE 44IN (TOURNIQUET CUFF) IMPLANT
CUTTER ANGLED DBL BITE 4.5 (BURR) IMPLANT
DECANTER SPIKE VIAL GLASS SM (MISCELLANEOUS) ×9 IMPLANT
GAUZE SPONGE 4X4 12PLY STRL (GAUZE/BANDAGES/DRESSINGS) ×3 IMPLANT
GAUZE SPONGE 4X4 16PLY XRAY LF (GAUZE/BANDAGES/DRESSINGS) ×3 IMPLANT
GAUZE XEROFORM 5X9 LF (GAUZE/BANDAGES/DRESSINGS) ×3 IMPLANT
GLOVE BIOGEL PI IND STRL 7.5 (GLOVE) ×2 IMPLANT
GLOVE BIOGEL PI INDICATOR 7.5 (GLOVE) ×1
GLOVE EXAM NITRILE MD LF STRL (GLOVE) ×3 IMPLANT
GLOVE SKINSENSE NS SZ8.0 LF (GLOVE) ×1
GLOVE SKINSENSE STRL SZ8.0 LF (GLOVE) ×2 IMPLANT
GLOVE SS N UNI LF 8.5 STRL (GLOVE) ×3 IMPLANT
GOWN STRL REUS W/ TWL LRG LVL3 (GOWN DISPOSABLE) ×2 IMPLANT
GOWN STRL REUS W/TWL LRG LVL3 (GOWN DISPOSABLE) ×2
GOWN STRL REUS W/TWL XL LVL3 (GOWN DISPOSABLE) ×3 IMPLANT
HLDR LEG FOAM (MISCELLANEOUS) ×2 IMPLANT
IV NS IRRIG 3000ML ARTHROMATIC (IV SOLUTION) ×9 IMPLANT
KIT BLADEGUARD II DBL (SET/KITS/TRAYS/PACK) ×3 IMPLANT
KIT ROOM TURNOVER AP CYSTO (KITS) ×3 IMPLANT
LEG HOLDER FOAM (MISCELLANEOUS) ×1
MANIFOLD NEPTUNE II (INSTRUMENTS) ×3 IMPLANT
MARKER SKIN DUAL TIP RULER LAB (MISCELLANEOUS) ×3 IMPLANT
NEEDLE HYPO 18GX1.5 BLUNT FILL (NEEDLE) ×3 IMPLANT
NEEDLE HYPO 21X1.5 SAFETY (NEEDLE) ×3 IMPLANT
NEEDLE SPNL 18GX3.5 QUINCKE PK (NEEDLE) ×3 IMPLANT
NS IRRIG 1000ML POUR BTL (IV SOLUTION) ×3 IMPLANT
PACK ARTHRO LIMB DRAPE STRL (MISCELLANEOUS) ×3 IMPLANT
PAD ABD 5X9 TENDERSORB (GAUZE/BANDAGES/DRESSINGS) ×3 IMPLANT
PAD ARMBOARD 7.5X6 YLW CONV (MISCELLANEOUS) ×3 IMPLANT
PADDING CAST COTTON 6X4 STRL (CAST SUPPLIES) ×3 IMPLANT
PADDING WEBRIL 6 STERILE (GAUZE/BANDAGES/DRESSINGS) ×3 IMPLANT
SET ARTHROSCOPY INST (INSTRUMENTS) ×3 IMPLANT
SET ARTHROSCOPY PUMP TUBE (IRRIGATION / IRRIGATOR) ×3 IMPLANT
SET BASIN LINEN APH (SET/KITS/TRAYS/PACK) ×3 IMPLANT
SPONGE GAUZE 4X4 12PLY (GAUZE/BANDAGES/DRESSINGS) ×3 IMPLANT
SUT ETHILON 3 0 FSL (SUTURE) ×3 IMPLANT
SYR 30ML LL (SYRINGE) ×3 IMPLANT
SYRINGE 10CC LL (SYRINGE) ×3 IMPLANT
WAND 50 DEG COVAC W/CORD (SURGICAL WAND) ×3 IMPLANT
WAND 90 DEG TURBOVAC W/CORD (SURGICAL WAND) IMPLANT
WAND ARTHRO PARAGON T2 (SURGICAL WAND) IMPLANT
YANKAUER SUCT BULB TIP 10FT TU (MISCELLANEOUS) ×9 IMPLANT

## 2014-12-11 NOTE — Transfer of Care (Signed)
Immediate Anesthesia Transfer of Care Note  Patient: Colleen [name]  Procedure(s) Performed: Procedure(s): KNEE ARTHROSCOPY WITH MEDIAL MENISECTOMY (Left) KNEE ARTHROSCOPY WITH LATERAL AND MEDIAL MENISECTOMY (Left)  Patient Location: PACU  Anesthesia Type:General  Level of Consciousness: sedated and patient cooperative  Airway & Oxygen Therapy: Patient Spontanous Breathing and non-rebreather face mask  Post-op Assessment: Report given to RN, Post -op Vital signs reviewed and stable and Patient moving all extremities  Post vital signs: Reviewed and stable  Last Vitals:  Filed Vitals:   12/11/14 1030  BP: 125/73  Pulse:   Temp:   Resp: 13    Complications: No apparent anesthesia complications

## 2014-12-11 NOTE — Brief Op Note (Signed)
12/11/2014  11:37 AM  PATIENT:  Colleen [name]  69 y.o. 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  Degenerative tear lateral meniscus.  Grade 3 chondral lesion medial femoral condyle  grade 4 chondral lesion patella  grade 3 chondral lesion trochlea  PROCEDURE:  Procedure(s): KNEE ARTHROSCOPY WITH MEDIAL MENISECTOMY (Left), lateral meniscectomy, chondroplasty medial femoral condyle  SURGEON:  Surgeon(s) and Role:    * Vickki Hearing, MD - Primary  PHYSICIAN ASSISTANT:   ASSISTANTS: none   ANESTHESIA:   general  EBL:  Total I/O In: 700 [I.V.:700] Out: 15 [Blood:15]  BLOOD ADMINISTERED:none  DRAINS: none   LOCAL MEDICATIONS USED:  MARCAINE     SPECIMEN:  No Specimen  DISPOSITION OF SPECIMEN:  N/A  COUNTS:  YES  TOURNIQUET:    DICTATION: .Dragon Dictation  PLAN OF CARE: Discharge to home after PACU  PATIENT DISPOSITION:  PACU - hemodynamically stable.   Delay start of Pharmacological VTE agent (>24hrs) due to surgical blood loss or risk of bleeding: not applicable

## 2014-12-11 NOTE — Op Note (Signed)
12/11/2014  Knee arthroscopy dictation  The patient was identified in the preoperative holding area using 2 approved identification mechanisms. The chart was reviewed and updated. The surgical site was confirmed and marked with an indelible marker.  The patient was taken to the operating room for anesthesia. After successful  general anesthesia, 2 g Ancef was used as IV antibiotics.  The patient was placed in the supine position with the (left knee) operative extremity in an arthroscopic leg holder and the opposite extremity in a padded leg holder.  The timeout was executed.  A lateral portal was established with an 11 blade and the scope was introduced into the joint. A diagnostic arthroscopy was performed in circumferential manner examining the entire knee joint. A medial portal was established and the diagnostic arthroscopy was repeated using a probe to palpate intra-articular structures as they were encountered.   The operative findings are   Medial grade 3 chondral lesion medial femoral condyle, torn medial meniscus posterior horn Lateral degenerative tear lateral meniscus grade 3/4 tibial plateau chondral lesion Patellofemoral grade 3 and grade 2 patellofemoral lesions Notch anterior cruciate ligament PCL intact   The medial meniscus was resected using a duckbill forceps. The meniscal fragments were removed with a motorized shaver. The meniscus was balanced with a combination of a motorized shaver and a 50 ArthroCare wand until a stable rim was obtained.  The lateral meniscus was debrided with a shaver  Chondroplasty was performed with a shaver on the medial femoral condyle  The arthroscopic pump was placed on the wash mode and any excess debris was removed from the joint using suction.  60 cc of Marcaine with epinephrine was injected through the arthroscope.  The portals were closed with 3-0 nylon suture.  A sterile bandage, Ace wrap and Cryo/Cuff was placed and the Cryo/Cuff  was activated. The patient was taken to the recovery room in stable condition.

## 2014-12-11 NOTE — Anesthesia Postprocedure Evaluation (Signed)
  Anesthesia Post-op Note  Patient: Colleen [name]  Procedure(s) Performed: Procedure(s): KNEE ARTHROSCOPY WITH LATERAL AND MEDIAL MENISECTOMY (Left) CHONDROPLASTY LEFT MEDIAL FEMORAL CONDYLE (Left)  Patient Location: PACU  Anesthesia Type:General  Level of Consciousness: awake, alert  and patient cooperative  Airway and Oxygen Therapy: Patient Spontanous Breathing  Post-op Pain: none  Post-op Assessment: Post-op Vital signs reviewed, Patient's Cardiovascular Status Stable, Respiratory Function Stable, Patent Airway and No signs of Nausea or vomiting              Post-op Vital Signs: Reviewed and stable  Last Vitals:  Filed Vitals:   12/11/14 1245  BP: 152/74  Pulse: 84  Temp:   Resp: 16    Complications: No apparent anesthesia complications

## 2014-12-11 NOTE — Interval H&P Note (Signed)
History and Physical Interval Note:  12/11/2014 10:12 AM  Colleen [name]  has presented today for surgery, with the diagnosis of left medial meniscus tear  The various methods of treatment have been discussed with the patient and family. After consideration of risks, benefits and other options for treatment, the patient has consented to  Procedure(s): LEFT KNEE ARTHROSCOPY WITH MEDIAL MENISECTOMY (Left) as a surgical intervention .  The patient's history has been reviewed, patient examined, no change in status, stable for surgery.  I have reviewed the patient's chart and labs.  Questions were answered to the patient's satisfaction.     Fuller Canada

## 2014-12-11 NOTE — Anesthesia Procedure Notes (Signed)
Procedure Name: LMA Insertion Date/Time: 12/11/2014 10:47 AM Performed by: Franco Nones Pre-anesthesia Checklist: Patient identified, Patient being monitored, Emergency Drugs available, Timeout performed and Suction available Patient Re-evaluated:Patient Re-evaluated prior to inductionOxygen Delivery Method: Circle System Utilized Preoxygenation: Pre-oxygenation with 100% oxygen Intubation Type: IV induction Ventilation: Mask ventilation without difficulty LMA: LMA inserted LMA Size: 3.0 Number of attempts: 1 Placement Confirmation: positive ETCO2 and breath sounds checked- equal and bilateral Tube secured with: Tape Dental Injury: Teeth and Oropharynx as per pre-operative assessment

## 2014-12-11 NOTE — Discharge Instructions (Signed)

## 2014-12-11 NOTE — Anesthesia Preprocedure Evaluation (Signed)
Anesthesia Evaluation  Patient identified by MRN, date of birth, ID band  Reviewed: Allergy & Precautions, NPO status , Patient's Chart, lab work & pertinent test results  Airway Mallampati: II  TM Distance: >3 FB     Dental  (+) Teeth Intact   Pulmonary neg pulmonary ROS,  breath sounds clear to auscultation        Cardiovascular hypertension, Pt. on medications Rhythm:Regular Rate:Normal     Neuro/Psych    GI/Hepatic GERD-  Medicated and Controlled,  Endo/Other    Renal/GU      Musculoskeletal   Abdominal   Peds  Hematology   Anesthesia Other Findings   Reproductive/Obstetrics                             Anesthesia Physical Anesthesia Plan  ASA: II  Anesthesia Plan: General   Post-op Pain Management:    Induction: Intravenous  Airway Management Planned: LMA  Additional Equipment:   Intra-op Plan:   Post-operative Plan: Extubation in OR  Informed Consent: I have reviewed the patients History and Physical, chart, labs and discussed the procedure including the risks, benefits and alternatives for the proposed anesthesia with the patient or authorized representative who has indicated his/her understanding and acceptance.     Plan Discussed with:   Anesthesia Plan Comments:         Anesthesia Quick Evaluation

## 2014-12-14 ENCOUNTER — Encounter (HOSPITAL_COMMUNITY): Payer: Self-pay | Admitting: Orthopedic Surgery

## 2014-12-14 ENCOUNTER — Ambulatory Visit (INDEPENDENT_AMBULATORY_CARE_PROVIDER_SITE_OTHER): Payer: Medicare HMO | Admitting: Orthopedic Surgery

## 2014-12-14 VITALS — BP 139/73 | Ht 61.0 in | Wt 142.0 lb

## 2014-12-14 DIAGNOSIS — Z9889 Other specified postprocedural states: Secondary | ICD-10-CM

## 2014-12-14 DIAGNOSIS — Z4789 Encounter for other orthopedic aftercare: Secondary | ICD-10-CM

## 2014-12-14 NOTE — Progress Notes (Signed)
Chief Complaint  Patient presents with  . Follow-up    post op 1, SALK, DOS 12/09/14    Postop day number 5  Status post left arthroscopy    Current complaints no real complaints she says she's doing well  Current medications minimal use of opiate Norco  Surgical site clean dressings  Assessment and plan Start physical therapy for 3-4 weeks  The operative findings are   Medial grade 3 chondral lesion medial femoral condyle, torn medial meniscus posterior horn Lateral degenerative tear lateral meniscus grade 3/4 tibial plateau chondral lesion Patellofemoral grade 3 and grade 2 patellofemoral lesions Notch anterior cruciate ligament PCL intact

## 2014-12-14 NOTE — Patient Instructions (Signed)
Call  aph therapy dept to schedule visits 

## 2014-12-17 ENCOUNTER — Ambulatory Visit (HOSPITAL_COMMUNITY): Payer: Medicare HMO | Attending: Orthopedic Surgery

## 2014-12-17 DIAGNOSIS — M25662 Stiffness of left knee, not elsewhere classified: Secondary | ICD-10-CM | POA: Insufficient documentation

## 2014-12-17 DIAGNOSIS — M25562 Pain in left knee: Secondary | ICD-10-CM | POA: Diagnosis present

## 2014-12-17 DIAGNOSIS — R2689 Other abnormalities of gait and mobility: Secondary | ICD-10-CM

## 2014-12-17 DIAGNOSIS — M6281 Muscle weakness (generalized): Secondary | ICD-10-CM | POA: Diagnosis present

## 2014-12-17 DIAGNOSIS — Z9889 Other specified postprocedural states: Secondary | ICD-10-CM

## 2014-12-17 NOTE — Therapy (Signed)
[facility] [address] [place], [place], [address] Phone: [phone]   Fax:  [phone]  Physical Therapy Evaluation  Patient Details  Name: Colleen [name] MRN: [id number] Date of Birth: June 06, 1945 Referring Provider:  Vickki Hearing, MD  Encounter Date: 12/17/2014      PT End of Session - 12/17/14 1532    Visit Number 1   Number of Visits 16   Date for PT Re-Evaluation 02/16/15   Authorization Type Medicare    Authorization Time Period 30 days    Authorization - Visit Number 1   Authorization - Number of Visits 10   PT Start Time 1017   PT Stop Time 1104   PT Time Calculation (min) 47 min   Activity Tolerance Patient tolerated treatment well;No increased pain   Behavior During Therapy [facility] for tasks assessed/performed      Past Medical History  Diagnosis Date  . Hypertension   . Hyperlipidemia   . Glaucoma   . GERD (gastroesophageal reflux disease)     Past Surgical History  Procedure Laterality Date  . Cholecystectomy    . Knee arthroscopy with lateral menisectomy Left 12/11/2014    Procedure: KNEE ARTHROSCOPY WITH LATERAL AND MEDIAL MENISECTOMY;  Surgeon: Vickki Hearing, MD;  Location: [facility];  Service: Orthopedics;  Laterality: Left;  . Chondroplasty Left 12/11/2014    Procedure: CHONDROPLASTY LEFT MEDIAL FEMORAL CONDYLE;  Surgeon: Vickki Hearing, MD;  Location: [facility];  Service: Orthopedics;  Laterality: Left;    There were no vitals filed for this visit.  Visit Diagnosis:  S/P arthroscopic surgery of left knee - Plan: PT plan of care cert/re-cert  Stiffness of knee joint, left - Plan: PT plan of care cert/re-cert  Generalized muscle weakness - Plan: PT plan of care cert/re-cert  Impaired gait and mobility - Plan: PT plan of care cert/re-cert  Pain in knee joint, left - Plan: PT plan of care cert/re-cert      Subjective Assessment - 12/17/14 1028    Subjective Pt reports she was previously  active, exercsing at the [employer] [facility] 3days/wk, when she suddenly noticed worsening pain in L knee. Pt saught medical attention and elected to undergo L knee scope, menisectomy and chondroplasty on 8/5.    Pertinent History Pt reports long history of knee arthitic pain in both knees, which was managed well by regular execise.     How long can you sit comfortably? 1 hour: doesn't feel limited   How long can you stand comfortably? 1 hour: doesn't feel limited, slight increase in pain   How long can you walk comfortably? 1 hour: doesn't feel limited, slight increase in pain    Patient Stated Goals Return to [facility] programs, and decrease new pain and stiffness in knee.    Currently in Pain? Yes   Pain Score 2    Pain Location Knee   Pain Orientation Left   Pain Descriptors / Indicators Sharp   Pain Type Surgical pain   Pain Radiating Towards L lateral compartment and mid upper thigh    Pain Onset In the past 7 days   Pain Frequency Intermittent   Aggravating Factors  too much walking, too much sitting   Pain Relieving Factors hydrocodone-acetomenophen at bed time, polar care   Effect of Pain on Daily Activities daily errands, and visiting friends, laundary in basement (11 stairs)    Multiple Pain Sites No            [facility]  PT Assessment - 12/17/14 0001    Assessment   Medical Diagnosis s/p L knee scope (menisectomy/chondropasty)   Hand Dominance Right   Balance Screen   Has the patient had a decrease in activity level because of a fear of falling?  Yes   Is the patient reluctant to leave their home because of a fear of falling?  No  began driving again yesterday.    Prior Function   Level of Independence Independent with basic ADLs;Independent with community mobility with device   Vocation Retired   Leisure visiting with friends, attending group classes at [employer] 3x/wk   Cognition   Overall Cognitive Status Within Functional Limits for tasks assessed   Attention Focused   Memory Appears intact    Observation/Other Assessments   Focus on Therapeutic Outcomes (FOTO)  30   AROM   Right Ankle Dorsiflexion 12   PROM   Left Knee Extension 5   Left Knee Flexion 118   Left Ankle Dorsiflexion 15   Right Knee   Right Knee Extension -2   Right Knee Flexion 125   Transfers   Five time sit to stand comments  12.03 seconds  no hands, no AD   Ambulation/Gait   Ambulation/Gait Yes   Assistive device Straight cane   Gait Pattern Antalgic;Wide base of support;Lateral trunk lean to left   Ambulation Surface Level;Indoor                   [facility] Adult PT Treatment/Exercise - 12/17/14 0001    Knee/Hip Exercises: Seated   Sit to Sand 5 reps  for 5x STS, no AD, no arms   Knee/Hip Exercises: Supine   Quad Sets AROM;Strengthening;Left;1 set;10 reps  3 sec hold   Short Arc Quad Sets 1 set;10 reps;AROM;Strengthening  1 sec hold on pillow   Heel Slides AROM;Left;1 set;10 reps                  PT Short Term Goals - 12/17/14 1543    PT SHORT TERM GOAL #1   Title Pt will improve strength for functional mobility as evidenced by improved 5xSTS of <9.5 seconds.   Baseline 12.03 seconds   Time 4   Period Weeks   Status New   PT SHORT TERM GOAL #2   Title Pt will improve functional mobility as evidence by withing 90% of norm values for age matched populations.    Baseline to be performed on second session.    Time 4   Period Weeks   Status New   PT SHORT TERM GOAL #3   Title Pt will improve joint mobility in L knee to at equal that of R knee.    Baseline limited on L .    Time 4   Period Weeks   Status New   PT SHORT TERM GOAL #4   Title Pt will tolerate performance of 12 steps with 1 HR and step to gait to improve access to basement for IADL.    Baseline unable.    Time 4   Period Weeks   Status New           PT Long Term Goals - 12/17/14 1551    PT LONG TERM GOAL #1   Title Pt will improve strength for functional mobility as evidenced by improved  5xSTS of <8.3 seconds.   Baseline 12.03s   Time 8   Period Weeks   Status New   PT LONG TERM GOAL #2  Title Pt will improve functional mobility as evidence by withing100% of norm values for age matched populations.    Time 8   Period Weeks   Status New   PT LONG TERM GOAL #3   Title --   PT LONG TERM GOAL #4   Title Pt will tolerate performance of 12 steps with  0-HR, carrying a 10 lb load and step-over gait to improve access to basement for IADL.    Baseline unable to perform.    Time 8   Period Weeks   Status New               Plan - 12-28-14 1533    Clinical Impression Statement Pt presenting on POD 6 after knee scope s/p derrangement of meniscus and femoral cartilage. Wound healing is WNL. Pt pain is managed well. ROM is moderately limited. Joint welling is evident, but localized to the knee joint only, absent fluid accumulation in the distal LE. Strength is moderately impaired as evidenced by effor t required to perform therex. Pt balance and gait are impaired as evidenced by altered gait mechanics. Power is mildly impaired as evidenced by 5x STS: 12.03 seconds. Pt will benefit from skilled intervention to address the above impairment to restore to PLOF in mobility.    Pt will benefit from skilled therapeutic intervention in order to improve on the following deficits Abnormal gait;Decreased activity tolerance;Decreased balance;Decreased mobility;Decreased range of motion;Decreased strength;Increased edema   Rehab Potential Good   PT Frequency 2x / week   PT Duration 8 weeks   PT Treatment/Interventions Gait training;Functional mobility training;Stair training;Therapeutic activities;Therapeutic exercise;Balance training;Manual techniques   PT Next Visit Plan , review HEP, continue c isolated strengthing of LLE, patella mobilizations, scar mobilization education once appropriate.    PT Home Exercise Plan Given sheet c quad sets, Short Arc Quads, and heel slides BID.     Consulted and Agree with Plan of Care Patient          G-Codes - Dec 28, 2014 1556    Functional Assessment Tool Used FOTO: 30   Functional Limitation Mobility: Walking and moving around   Mobility: Walking and Moving Around Current Status [phone]) At least 60 percent but less than 80 percent impaired, limited or restricted   Mobility: Walking and Moving Around Goal Status [phone]) At least 40 percent but less than 60 percent impaired, limited or restricted       Problem List Patient Active Problem List   Diagnosis Date Noted  . Medial meniscus, posterior horn derangement   . Meniscus, lateral, derangement   . Primary osteoarthritis of left knee   . Annual physical exam 11/15/2014  . Left knee pain 11/15/2014  . Need for vaccination with 13-polyvalent pneumococcal conjugate vaccine 11/15/2014  . IFG (impaired fasting glucose) 10/13/2013  . ALLERGIC RHINITIS, SEASONAL 03/30/2008  . Hyperlipidemia LDL goal <100 09/17/2007  . Essential hypertension 09/17/2007  . GERD 09/17/2007  . Osteopenia 09/17/2007    Lakindra Wible C 12/28/14, 4:03 PM  4:03 PM  Rosamaria Lints, PT, DPT [place] License # [id number]   [facility] [address] [place], [place], [address] Phone: [phone]   Fax:  [phone]

## 2014-12-17 NOTE — Patient Instructions (Signed)
Quad Set   With other leg bent, foot flat, slowly tighten muscles on thigh of straight leg while counting out loud to ____. Repeat with other leg. Repeat ____ times. Do ____ sessions per day.  http://gt2.exer.us/276   Copyright  VHI. All rights reserved.  Bracing With Heel Slides (Supine)   With neutral spine, tighten pelvic floor and abdominals and hold. Alternating legs, slide heel to bottom. Repeat ___ times. Do ___ times a day.   Copyright  VHI. All rights reserved.   Short Arc [employer] a large can or rolled towel under leg. Straighten knee and leg. Hold ____ seconds. Repeat with other leg. Repeat ____ times. Do ____ sessions per day.  http://gt2.exer.us/366   Copyright  VHI. All rights reserved.

## 2014-12-21 ENCOUNTER — Ambulatory Visit (HOSPITAL_COMMUNITY)
Admission: RE | Admit: 2014-12-21 | Discharge: 2014-12-21 | Disposition: A | Discharge status: Home / Self Care | Payer: Medicare HMO | Source: Ambulatory Visit | Attending: Family Medicine | Admitting: Family Medicine

## 2014-12-21 ENCOUNTER — Ambulatory Visit (HOSPITAL_COMMUNITY): Payer: Medicare HMO | Admitting: Physical Therapy

## 2014-12-21 DIAGNOSIS — M6281 Muscle weakness (generalized): Secondary | ICD-10-CM

## 2014-12-21 DIAGNOSIS — Z9889 Other specified postprocedural states: Secondary | ICD-10-CM

## 2014-12-21 DIAGNOSIS — Z1231 Encounter for screening mammogram for malignant neoplasm of breast: Secondary | ICD-10-CM | POA: Insufficient documentation

## 2014-12-21 DIAGNOSIS — M25662 Stiffness of left knee, not elsewhere classified: Secondary | ICD-10-CM

## 2014-12-21 DIAGNOSIS — R2689 Other abnormalities of gait and mobility: Secondary | ICD-10-CM

## 2014-12-21 DIAGNOSIS — M25562 Pain in left knee: Secondary | ICD-10-CM

## 2014-12-21 NOTE — Therapy (Signed)
[facility] [address] [place], [place], [address] Phone: [phone]   Fax:  [phone]  Physical Therapy Treatment  Patient Details  Name: Colleen [name] MRN: [id number] Date of Birth: Oct 05, 1945 Referring Provider:  Kerri Perches, MD  Encounter Date: 12/21/2014      PT End of Session - 12/21/14 1604    Visit Number 2   Number of Visits 16   Date for PT Re-Evaluation 02/16/15   Authorization Type Medicare    Authorization Time Period 12/17/14 to 02/16/15   Authorization - Visit Number 2   Authorization - Number of Visits 10   PT Start Time 1517   PT Stop Time 1557   PT Time Calculation (min) 40 min   Activity Tolerance Patient tolerated treatment well;No increased pain   Behavior During Therapy [facility] for tasks assessed/performed      Past Medical History  Diagnosis Date  . Hypertension   . Hyperlipidemia   . Glaucoma   . GERD (gastroesophageal reflux disease)     Past Surgical History  Procedure Laterality Date  . Cholecystectomy    . Knee arthroscopy with lateral menisectomy Left 12/11/2014    Procedure: KNEE ARTHROSCOPY WITH LATERAL AND MEDIAL MENISECTOMY;  Surgeon: Vickki Hearing, MD;  Location: [facility];  Service: Orthopedics;  Laterality: Left;  . Chondroplasty Left 12/11/2014    Procedure: CHONDROPLASTY LEFT MEDIAL FEMORAL CONDYLE;  Surgeon: Vickki Hearing, MD;  Location: [facility];  Service: Orthopedics;  Laterality: Left;    There were no vitals filed for this visit.  Visit Diagnosis:  S/P arthroscopic surgery of left knee  Stiffness of knee joint, left  Generalized muscle weakness  Impaired gait and mobility  Pain in knee joint, left      Subjective Assessment - 12/21/14 1518    Subjective Patient reports she is doing OK today, having pain 5/10   Pertinent History Pt reports long history of knee arthitic pain in both knees, which was managed well by regular execise.     Currently in Pain? Yes    Pain Score 5    Pain Location Knee   Pain Orientation Left            [facility] PT Assessment - 12/21/14 0001    6 minute walk test results    Endurance additional comments 615ft                      [facility] Adult PT Treatment/Exercise - 12/21/14 0001    Knee/Hip Exercises: Stretches   Active Hamstring Stretch Both;2 reps;30 seconds   Active Hamstring Stretch Limitations 12 inch box    Gastroc Stretch Both;2 reps;30 seconds   Gastroc Stretch Limitations slantboard    Knee/Hip Exercises: Standing   Heel Raises Both;1 set;10 reps   Forward Lunges Both;1 set;10 reps   Forward Lunges Limitations 6 inch box    Side Lunges Both;1 set;10 reps   Side Lunges Limitations 6 inch box    Lateral Step Up Both;1 set;10 reps   Lateral Step Up Limitations 4 inch box    Forward Step Up Both;1 set;10 reps   Forward Step Up Limitations 4 inch box    Rocker Board Limitations x20AP, x20 lateral U HHA    Gait Training Gait training heel-toe and also working on improvement of trunk rotation/arm swing  x463ft    Other Standing Knee Exercises --   Knee/Hip Exercises: Supine   Quad Sets Left;1 set;10  reps   Quad Sets Limitations towel roll, 3 second holds    Bridges Both;1 set;10 reps   Knee/Hip Exercises: Sidelying   Clams 1x10 each side                 PT Education - 12/21/14 1603    Education provided Yes   Education Details reviewed inital eval and goals    Person(s) Educated Patient   Methods Explanation   Comprehension Verbalized understanding          PT Short Term Goals - 12/17/14 1543    PT SHORT TERM GOAL #1   Title Pt will improve strength for functional mobility as evidenced by improved 5xSTS of <9.5 seconds.   Baseline 12.03 seconds   Time 4   Period Weeks   Status New   PT SHORT TERM GOAL #2   Title Pt will improve functional mobility as evidence by withing 90% of norm values for age matched populations.    Baseline to be performed on  second session.    Time 4   Period Weeks   Status New   PT SHORT TERM GOAL #3   Title Pt will improve joint mobility in L knee to at equal that of R knee.    Baseline limited on L .    Time 4   Period Weeks   Status New   PT SHORT TERM GOAL #4   Title Pt will tolerate performance of 12 steps with 1 HR and step to gait to improve access to basement for IADL.    Baseline unable.    Time 4   Period Weeks   Status New           PT Long Term Goals - 12/17/14 1551    PT LONG TERM GOAL #1   Title Pt will improve strength for functional mobility as evidenced by improved 5xSTS of <8.3 seconds.   Baseline 12.03s   Time 8   Period Weeks   Status New   PT LONG TERM GOAL #2   Title Pt will improve functional mobility as evidence by withing100% of norm values for age matched populations.    Time 8   Period Weeks   Status New   PT LONG TERM GOAL #3   Title --   PT LONG TERM GOAL #4   Title Pt will tolerate performance of 12 steps with  0-HR, carrying a 10 lb load and step-over gait to improve access to basement for IADL.    Baseline unable to perform.    Time 8   Period Weeks   Status New               Plan - 12/21/14 1605    Clinical Impression Statement Introduced functional exercises and stretches today with good tolerance by patient. PAlso introduced gait training with focus on heel-toe gait pattern today as well as on trunk rotation during gait today. Pain decreased at end of session.    Pt will benefit from skilled therapeutic intervention in order to improve on the following deficits Abnormal gait;Decreased activity tolerance;Decreased balance;Decreased mobility;Decreased range of motion;Decreased strength;Increased edema   Rehab Potential Good   PT Frequency 2x / week   PT Duration 8 weeks   PT Treatment/Interventions Gait training;Functional mobility training;Stair training;Therapeutic activities;Therapeutic exercise;Balance training;Manual techniques   PT  Next Visit Plan Functional strengthening and stretching; gait training; patella mobs and scar mobs    PT Home Exercise Plan Given sheet c  quad sets, Short Arc Quads, and heel slides BID.    Consulted and Agree with Plan of Care Patient        Problem List Patient Active Problem List   Diagnosis Date Noted  . Medial meniscus, posterior horn derangement   . Meniscus, lateral, derangement   . Primary osteoarthritis of left knee   . Annual physical exam 11/15/2014  . Left knee pain 11/15/2014  . Need for vaccination with 13-polyvalent pneumococcal conjugate vaccine 11/15/2014  . IFG (impaired fasting glucose) 10/13/2013  . ALLERGIC RHINITIS, SEASONAL 03/30/2008  . Hyperlipidemia LDL goal <100 09/17/2007  . Essential hypertension 09/17/2007  . GERD 09/17/2007  . Osteopenia 09/17/2007    Nedra Hai PT, DPT [phone]  [facility] [address] [place], [place], [address] Phone: [phone]   Fax:  [phone]

## 2014-12-22 ENCOUNTER — Ambulatory Visit (HOSPITAL_COMMUNITY): Payer: Medicare HMO

## 2014-12-22 DIAGNOSIS — Z9889 Other specified postprocedural states: Secondary | ICD-10-CM

## 2014-12-22 DIAGNOSIS — M6281 Muscle weakness (generalized): Secondary | ICD-10-CM

## 2014-12-22 DIAGNOSIS — M25562 Pain in left knee: Secondary | ICD-10-CM

## 2014-12-22 DIAGNOSIS — R2689 Other abnormalities of gait and mobility: Secondary | ICD-10-CM

## 2014-12-22 DIAGNOSIS — M25662 Stiffness of left knee, not elsewhere classified: Secondary | ICD-10-CM

## 2014-12-22 NOTE — Therapy (Signed)
[facility] [address] [place], [place], [address] Phone: [phone]   Fax:  [phone]  Physical Therapy Treatment  Patient Details  Name: Colleen [name] MRN: [id number] Date of Birth: [birth year] Referring Provider:  Vickki Hearing, MD  Encounter Date: 12/22/2014      PT End of Session - 12/22/14 1023    Visit Number 3   Number of Visits 16   Date for PT Re-Evaluation 01/17/15   Authorization Type Medicare    Authorization Time Period 12/17/14 to 02/16/15   Authorization - Visit Number 3   Authorization - Number of Visits 10   PT Start Time 1019   PT Stop Time 1103   PT Time Calculation (min) 44 min   Activity Tolerance Patient tolerated treatment well   Behavior During Therapy [facility] for tasks assessed/performed      Past Medical History  Diagnosis Date  . Hypertension   . Hyperlipidemia   . Glaucoma   . GERD (gastroesophageal reflux disease)     Past Surgical History  Procedure Laterality Date  . Cholecystectomy    . Knee arthroscopy with lateral menisectomy Left 12/11/2014    Procedure: KNEE ARTHROSCOPY WITH LATERAL AND MEDIAL MENISECTOMY;  Surgeon: Vickki Hearing, MD;  Location: [facility];  Service: Orthopedics;  Laterality: Left;  . Chondroplasty Left 12/11/2014    Procedure: CHONDROPLASTY LEFT MEDIAL FEMORAL CONDYLE;  Surgeon: Vickki Hearing, MD;  Location: [facility];  Service: Orthopedics;  Laterality: Left;    There were no vitals filed for this visit.  Visit Diagnosis:  S/P arthroscopic surgery of left knee  Stiffness of knee joint, left  Generalized muscle weakness  Impaired gait and mobility  Pain in knee joint, left      Subjective Assessment - 12/22/14 1020    Subjective Pt stated she is stiff today, pain scale 3/10 Lt knee    Currently in Pain? Yes   Pain Score 3    Pain Location Knee   Pain Orientation Left   Pain Descriptors / Indicators Sore                          [facility] Adult PT Treatment/Exercise - 12/22/14 0001    Knee/Hip Exercises: Stretches   Active Hamstring Stretch Both;2 reps;30 seconds   Active Hamstring Stretch Limitations 12 inch box    Quad Stretch 3 reps;30 seconds   Quad Stretch Limitations prone with rope   Gastroc Stretch Both;3 reps;30 seconds   Knee/Hip Exercises: Standing   Terminal Knee Extension Limitations 10x 5" with RTB   Lateral Step Up 15 reps;Hand Hold: 1;Step Height: 6";Left   Forward Step Up Both;15 reps;Hand Hold: 1;Step Height: 6"   Functional Squat 10 reps   Functional Squat Limitations 3D hip excursion   SLS Rt 16", Lt 3x 30" with 1 HHA max of 3   Gait Training Gait training heel-toe with focus on knee extension with heel strike and also working on improvement of trunk rotation/arm swing  no AD x 250   Other Standing Knee Exercises tandem stance 2x 30", tandem gait 1RT   Knee/Hip Exercises: Supine   Quad Sets Left;1 set;10 reps   Quad Sets Limitations towel roll 3" holds   Short Arc Quad Sets 15 reps   Heel Slides AROM;Left;1 set;10 reps   Heel Slides Limitations 4-123 degrees   Terminal Knee Extension 10 reps   Terminal Knee Extension Limitations 5" holds  Knee/Hip Exercises: Prone   Hip Extension Limitations TKE 10x5"                PT Education - 12/21/14 1603    Education provided Yes   Education Details reviewed inital eval and goals    Person(s) Educated Patient   Methods Explanation   Comprehension Verbalized understanding          PT Short Term Goals - 12/22/14 1025    PT SHORT TERM GOAL #1   Title Pt will improve strength for functional mobility as evidenced by improved 5xSTS of <9.5 seconds.   Status On-going   PT SHORT TERM GOAL #2   Title Pt will improve functional mobility as evidence by withing 90% of norm values for age matched populations.    Status On-going   PT SHORT TERM GOAL #3   Title Pt will improve joint mobility in L knee to at equal that of R knee.     Status On-going   PT SHORT TERM GOAL #4   Title Pt will tolerate performance of 12 steps with 1 HR and step to gait to improve access to basement for IADL.    Status On-going           PT Long Term Goals - 12/17/14 1551    PT LONG TERM GOAL #1   Title Pt will improve strength for functional mobility as evidenced by improved 5xSTS of <8.3 seconds.   Baseline 12.03s   Time 8   Period Weeks   Status New   PT LONG TERM GOAL #2   Title Pt will improve functional mobility as evidence by withing100% of norm values for age matched populations.    Time 8   Period Weeks   Status New   PT LONG TERM GOAL #3   Title --   PT LONG TERM GOAL #4   Title Pt will tolerate performance of 12 steps with  0-HR, carrying a 10 lb load and step-over gait to improve access to basement for IADL.    Baseline unable to perform.    Time 8   Period Weeks   Status New               Plan - 12/22/14 1114    Clinical Impression Statement Continued session focus on improving functional strengthening and stretches.  Began session with 3D hip excursion to improve hip mobilty to reduce stiffness.  Continued heel to toe gait pattern with improve trunk rotation, cueing to improve knee extension with heel strike.  Added TKE activaties in standing, prone and supine to imrpove knee extension.  AROM 4-123 degrees.  Pt reports pain reduced at end of session.     PT Next Visit Plan Functional strengthening and stretching; gait training; patella mobs and scar mobs         Problem List Patient Active Problem List   Diagnosis Date Noted  . Medial meniscus, posterior horn derangement   . Meniscus, lateral, derangement   . Primary osteoarthritis of left knee   . Annual physical exam 11/15/2014  . Left knee pain 11/15/2014  . Need for vaccination with 13-polyvalent pneumococcal conjugate vaccine 11/15/2014  . IFG (impaired fasting glucose) 10/13/2013  . ALLERGIC RHINITIS, SEASONAL 03/30/2008  .  Hyperlipidemia LDL goal <100 09/17/2007  . Essential hypertension 09/17/2007  . GERD 09/17/2007  . Osteopenia 09/17/2007   Becky Sax, LPTA; CBIS [phone]  Juel Burrow 12/22/2014, 6:02 PM  [place] [facility]  [place], [place], [address] Phone: [phone]   Fax:  [phone]

## 2014-12-31 ENCOUNTER — Ambulatory Visit (HOSPITAL_COMMUNITY): Payer: Medicare HMO | Admitting: Physical Therapy

## 2014-12-31 DIAGNOSIS — Z9889 Other specified postprocedural states: Secondary | ICD-10-CM

## 2014-12-31 DIAGNOSIS — R2689 Other abnormalities of gait and mobility: Secondary | ICD-10-CM

## 2014-12-31 DIAGNOSIS — M25662 Stiffness of left knee, not elsewhere classified: Secondary | ICD-10-CM

## 2014-12-31 DIAGNOSIS — M25562 Pain in left knee: Secondary | ICD-10-CM

## 2014-12-31 DIAGNOSIS — M6281 Muscle weakness (generalized): Secondary | ICD-10-CM

## 2014-12-31 NOTE — Therapy (Signed)
[facility] [address] [place], [place], [address] Phone: [phone]   Fax:  [phone]  Physical Therapy Treatment  Patient Details  Name: Colleen [name] MRN: [id number] Date of Birth: 1945-06-01 Referring Provider:  Vickki Hearing, MD  Encounter Date: 12/31/2014      PT End of Session - 12/31/14 1320    Visit Number 4   Number of Visits 16   Date for PT Re-Evaluation 01/17/15   Authorization Type Medicare    Authorization Time Period 12/17/14 to 02/16/15   Authorization - Visit Number 4   Authorization - Number of Visits 10   PT Start Time 1304   PT Stop Time 1345   PT Time Calculation (min) 41 min   Activity Tolerance Patient tolerated treatment well   Behavior During Therapy [facility] for tasks assessed/performed      Past Medical History  Diagnosis Date  . Hypertension   . Hyperlipidemia   . Glaucoma   . GERD (gastroesophageal reflux disease)     Past Surgical History  Procedure Laterality Date  . Cholecystectomy    . Knee arthroscopy with lateral menisectomy Left 12/11/2014    Procedure: KNEE ARTHROSCOPY WITH LATERAL AND MEDIAL MENISECTOMY;  Surgeon: Vickki Hearing, MD;  Location: [facility];  Service: Orthopedics;  Laterality: Left;  . Chondroplasty Left 12/11/2014    Procedure: CHONDROPLASTY LEFT MEDIAL FEMORAL CONDYLE;  Surgeon: Vickki Hearing, MD;  Location: [facility];  Service: Orthopedics;  Laterality: Left;    There were no vitals filed for this visit.  Visit Diagnosis:  S/P arthroscopic surgery of left knee  Stiffness of knee joint, left  Generalized muscle weakness  Impaired gait and mobility  Pain in knee joint, left      Subjective Assessment - 12/31/14 1336    Subjective Pt states her main difficulty is stiffness and actvitiy tolerance. Pt also wants to return to stair negotiation.  Currently without pain.   Currently in Pain? No/denies                         [facility] Adult PT  Treatment/Exercise - 12/31/14 1313    Knee/Hip Exercises: Stretches   Active Hamstring Stretch Both;2 reps;30 seconds   Active Hamstring Stretch Limitations 12 inch box    Gastroc Stretch Both;2 reps;30 seconds   Gastroc Stretch Limitations slantboard   Knee/Hip Exercises: Aerobic   Nustep 10 minutes level 2 hils #3 LE only   Knee/Hip Exercises: Standing   Heel Raises Both;15 reps   Heel Raises Limitations toeraises 10 reps   Forward Lunges Both;1 set;10 reps   Forward Lunges Limitations 4 inch box    Side Lunges Both;1 set;10 reps   Side Lunges Limitations 4 inch box    Lateral Step Up 15 reps;Hand Hold: 1;Step Height: 6";Left   Lateral Step Up Limitations 4 inch box    Forward Step Up Both;15 reps;Hand Hold: 1;Step Height: 6"   Forward Step Up Limitations 6 inch box    Functional Squat 10 reps   Functional Squat Limitations 3D hip excursion   Stairs 4 RT 7" steps with 1 HR reciprocally                  PT Short Term Goals - 12/22/14 1025    PT SHORT TERM GOAL #1   Title Pt will improve strength for functional mobility as evidenced by improved 5xSTS of <9.5 seconds.   Status On-going  PT SHORT TERM GOAL #2   Title Pt will improve functional mobility as evidence by withing 90% of norm values for age matched populations.    Status On-going   PT SHORT TERM GOAL #3   Title Pt will improve joint mobility in L knee to at equal that of R knee.    Status On-going   PT SHORT TERM GOAL #4   Title Pt will tolerate performance of 12 steps with 1 HR and step to gait to improve access to basement for IADL.    Status On-going           PT Long Term Goals - 12/17/14 1551    PT LONG TERM GOAL #1   Title Pt will improve strength for functional mobility as evidenced by improved 5xSTS of <8.3 seconds.   Baseline 12.03s   Time 8   Period Weeks   Status New   PT LONG TERM GOAL #2   Title Pt will improve functional mobility as evidence by withing100% of norm values  for age matched populations.    Time 8   Period Weeks   Status New   PT LONG TERM GOAL #3   Title --   PT LONG TERM GOAL #4   Title Pt will tolerate performance of 12 steps with  0-HR, carrying a 10 lb load and step-over gait to improve access to basement for IADL.    Baseline unable to perform.    Time 8   Period Weeks   Status New               Plan - 12/31/14 1321    Clinical Impression Statement Added nustep to increase knee mobility, actvitiey tolerance and decrease knee stiffness.  Progressed to stair negotiation with patient able to complete reciprocally 7" height and 1 HR.  Pt with washer / dryer in basement.  Pt wtihout difficulty or pain noted with stairs.  Pt returns to MD on Monday and is overall progressing well.  Encouraged patient to continue ice, elevation and add self massage at home for swelling.   PT Next Visit Plan Functional strengthening and stretching; gait training; patella mobs and scar mobs as needed.          Problem List Patient Active Problem List   Diagnosis Date Noted  . Medial meniscus, posterior horn derangement   . Meniscus, lateral, derangement   . Primary osteoarthritis of left knee   . Annual physical exam 11/15/2014  . Left knee pain 11/15/2014  . Need for vaccination with 13-polyvalent pneumococcal conjugate vaccine 11/15/2014  . IFG (impaired fasting glucose) 10/13/2013  . ALLERGIC RHINITIS, SEASONAL 03/30/2008  . Hyperlipidemia LDL goal <100 09/17/2007  . Essential hypertension 09/17/2007  . GERD 09/17/2007  . Osteopenia 09/17/2007    Lurena Nida, PTA/CLT [phone]  12/31/2014, 1:37 PM  [facility] [address] [place], [place], [address] Phone: [phone]   Fax:  [phone]

## 2015-01-04 ENCOUNTER — Ambulatory Visit (INDEPENDENT_AMBULATORY_CARE_PROVIDER_SITE_OTHER): Payer: Self-pay | Admitting: Orthopedic Surgery

## 2015-01-04 ENCOUNTER — Encounter: Payer: Self-pay | Admitting: Orthopedic Surgery

## 2015-01-04 VITALS — BP 133/74 | Ht 61.0 in | Wt 142.0 lb

## 2015-01-04 DIAGNOSIS — Z9889 Other specified postprocedural states: Secondary | ICD-10-CM

## 2015-01-04 DIAGNOSIS — Z4789 Encounter for other orthopedic aftercare: Secondary | ICD-10-CM

## 2015-01-04 MED ORDER — HYDROCODONE-ACETAMINOPHEN 5-325 MG PO TABS: 1.0000 | ORAL_TABLET | Freq: Four times a day (QID) | ORAL | Status: DC | PRN

## 2015-01-04 NOTE — Progress Notes (Signed)
Patient ID: Colleen [name], female   DOB: 1945/11/21, 69 y.o.   MRN: [id number]  Follow up visit  Chief Complaint  Patient presents with  . Follow-up    3 week follow up SALK, DOS 12/09/14    BP 133/74 mmHg  Ht  (1.549 m)  Wt 142 lb (64.411 kg)  BMI 26.84 kg/m2  Encounter Diagnoses  Name Primary?  . S/P left knee arthroscopy Yes  . Aftercare following surgery of the musculoskeletal system     Postop visit status post knee arthroscopy she does have quite a bit of arthritis in that knee  She's progressing well she's using her cane she's got good flexion and the knee some soreness over the anteromedial portal  Continue therapy follow-up 4 weeks  Norco 5 mg every 6 #56 no refills

## 2015-01-05 ENCOUNTER — Ambulatory Visit (HOSPITAL_COMMUNITY): Payer: Medicare HMO

## 2015-01-05 DIAGNOSIS — M25662 Stiffness of left knee, not elsewhere classified: Secondary | ICD-10-CM

## 2015-01-05 DIAGNOSIS — M25562 Pain in left knee: Secondary | ICD-10-CM

## 2015-01-05 DIAGNOSIS — Z9889 Other specified postprocedural states: Secondary | ICD-10-CM | POA: Diagnosis not present

## 2015-01-05 DIAGNOSIS — R2689 Other abnormalities of gait and mobility: Secondary | ICD-10-CM

## 2015-01-05 DIAGNOSIS — M6281 Muscle weakness (generalized): Secondary | ICD-10-CM

## 2015-01-05 NOTE — Therapy (Signed)
[facility] [address] [place], [place], [address] Phone: [phone]   Fax:  [phone]  Physical Therapy Treatment  Patient Details  Name: Colleen [name] MRN: [id number] Date of Birth: 12-Jul-1945 Referring Provider:  Vickki Hearing, MD  Encounter Date: 01/05/2015      PT End of Session - 01/05/15 1045    Visit Number 5   Number of Visits 16   Date for PT Re-Evaluation 01/17/15   Authorization Type Medicare    Authorization Time Period 12/17/14 to 02/16/15   Authorization - Visit Number 5   Authorization - Number of Visits 10   PT Start Time 1012   PT Stop Time 1100   PT Time Calculation (min) 48 min   Activity Tolerance Patient tolerated treatment well   Behavior During Therapy [facility] for tasks assessed/performed      Past Medical History  Diagnosis Date  . Hypertension   . Hyperlipidemia   . Glaucoma   . GERD (gastroesophageal reflux disease)     Past Surgical History  Procedure Laterality Date  . Cholecystectomy    . Knee arthroscopy with lateral menisectomy Left 12/11/2014    Procedure: KNEE ARTHROSCOPY WITH LATERAL AND MEDIAL MENISECTOMY;  Surgeon: Vickki Hearing, MD;  Location: [facility];  Service: Orthopedics;  Laterality: Left;  . Chondroplasty Left 12/11/2014    Procedure: CHONDROPLASTY LEFT MEDIAL FEMORAL CONDYLE;  Surgeon: Vickki Hearing, MD;  Location: [facility];  Service: Orthopedics;  Laterality: Left;    There were no vitals filed for this visit.  Visit Diagnosis:  S/P arthroscopic surgery of left knee  Stiffness of knee joint, left  Generalized muscle weakness  Impaired gait and mobility  Pain in knee joint, left      Subjective Assessment - 01/05/15 1017    Subjective Pt stated she knee is coming along, knee is stiff this morning.  Stated MD happy with progress.   Currently in Pain? Yes   Pain Score 2    Pain Location Knee   Pain Orientation Left   Pain Descriptors / Indicators  Sore;Tightness            [facility] PT Assessment - 01/05/15 0001    Assessment   Medical Diagnosis s/p L knee scope (menisectomy/chondropasty)   Onset Date/Surgical Date 12/11/14   Next MD Visit Romeo Apple 02/01/2015           [facility] Adult PT Treatment/Exercise - 01/05/15 0001    Exercises   Exercises Knee/Hip   Knee/Hip Exercises: Stretches   Active Hamstring Stretch Both;3 reps;30 seconds   Active Hamstring Stretch Limitations 14 inch box    Gastroc Stretch Both;3 reps;30 seconds   Gastroc Stretch Limitations slantboard   Knee/Hip Exercises: Aerobic   Nustep 10 minutes level 3 hils #3 LE only   Knee/Hip Exercises: Standing   Heel Raises Both;15 reps   Heel Raises Limitations toeraises    Forward Lunges Both;15 reps   Forward Lunges Limitations 4 inch box    Side Lunges Both;10 reps   Side Lunges Limitations 4 inch box    Terminal Knee Extension Left;15 reps;Theraband   Theraband Level (Terminal Knee Extension) Level 2 (Red)   Terminal Knee Extension Limitations 5" holds   Lateral Step Up 15 reps;Hand Hold: 2;Step Height: 6"   Functional Squat 15 reps   Functional Squat Limitations 3D hip excursion   Stairs 5 RT 7" steps with 1 HR reciprocally cueing for eccentric control   Other Standing Knee  Exercises single leg balance reach 5x each direction   Knee/Hip Exercises: Supine   Terminal Knee Extension 15 reps   Terminal Knee Extension Limitations 5" holds            PT Short Term Goals - 12/22/14 1025    PT SHORT TERM GOAL #1   Title Pt will improve strength for functional mobility as evidenced by improved 5xSTS of <9.5 seconds.   Status On-going   PT SHORT TERM GOAL #2   Title Pt will improve functional mobility as evidence by withing 90% of norm values for age matched populations.    Status On-going   PT SHORT TERM GOAL #3   Title Pt will improve joint mobility in L knee to at equal that of R knee.    Status On-going   PT SHORT TERM GOAL #4   Title Pt  will tolerate performance of 12 steps with 1 HR and step to gait to improve access to basement for IADL.    Status On-going           PT Long Term Goals - 12/17/14 1551    PT LONG TERM GOAL #1   Title Pt will improve strength for functional mobility as evidenced by improved 5xSTS of <8.3 seconds.   Baseline 12.03s   Time 8   Period Weeks   Status New   PT LONG TERM GOAL #2   Title Pt will improve functional mobility as evidence by withing100% of norm values for age matched populations.    Time 8   Period Weeks   Status New   PT LONG TERM GOAL #3   Title --   PT LONG TERM GOAL #4   Title Pt will tolerate performance of 12 steps with  0-HR, carrying a 10 lb load and step-over gait to improve access to basement for IADL.    Baseline unable to perform.    Time 8   Period Weeks   Status New               Plan - 01/05/15 1050    Clinical Impression Statement Functional strengthening and ROM progressing well.  Pt able to demonstrate appropriate form and technique following cueing with minimal difficutly.  Improved stair negotation with noted decreased eccentric control descending stairs.  Added single leg balance reach for quad strengthening to improve eccentric control.  Pt continues to have 3 degrees lacking for full extension.  Pt stated pain and knee stiffness reduced  at end of session.  Pt encouraged to apply ice when home for edema and pain control.     PT Next Visit Plan Functional strengthening and stretching; gait training; patella mobs and scar mobs as needed.          Problem List Patient Active Problem List   Diagnosis Date Noted  . Medial meniscus, posterior horn derangement   . Meniscus, lateral, derangement   . Primary osteoarthritis of left knee   . Annual physical exam 11/15/2014  . Left knee pain 11/15/2014  . Need for vaccination with 13-polyvalent pneumococcal conjugate vaccine 11/15/2014  . IFG (impaired fasting glucose) 10/13/2013  .  ALLERGIC RHINITIS, SEASONAL 03/30/2008  . Hyperlipidemia LDL goal <100 09/17/2007  . Essential hypertension 09/17/2007  . GERD 09/17/2007  . Osteopenia 09/17/2007   Becky Sax, LPTA; CBIS [phone]  Juel Burrow 01/05/2015, 10:54 AM  [facility] [address] [place], [place], [address] Phone: [phone]   Fax:  [phone]      

## 2015-01-05 NOTE — Addendum Note (Signed)
Addended by: Adella Hare B on: 01/05/2015 11:36 AM   Modules accepted: Medications

## 2015-01-07 ENCOUNTER — Ambulatory Visit (HOSPITAL_COMMUNITY): Payer: Medicare HMO | Attending: Orthopedic Surgery | Admitting: Physical Therapy

## 2015-01-07 DIAGNOSIS — M25662 Stiffness of left knee, not elsewhere classified: Secondary | ICD-10-CM | POA: Diagnosis present

## 2015-01-07 DIAGNOSIS — M25562 Pain in left knee: Secondary | ICD-10-CM | POA: Insufficient documentation

## 2015-01-07 DIAGNOSIS — Z9889 Other specified postprocedural states: Secondary | ICD-10-CM

## 2015-01-07 DIAGNOSIS — M6281 Muscle weakness (generalized): Secondary | ICD-10-CM | POA: Diagnosis present

## 2015-01-07 DIAGNOSIS — R2689 Other abnormalities of gait and mobility: Secondary | ICD-10-CM | POA: Diagnosis present

## 2015-01-07 NOTE — Therapy (Signed)
[facility] [address] [place], [place], [address] Phone: [phone]   Fax:  [phone]  Physical Therapy Treatment  Patient Details  Name: Colleen [name] MRN: [id number] Date of Birth: 05/10/1945 Referring Provider:  Vickki Hearing, MD  Encounter Date: 01/07/2015      PT End of Session - 01/07/15 1013    Visit Number 6   Number of Visits 16   Date for PT Re-Evaluation 01/17/15   Authorization Type Medicare    Authorization Time Period 12/17/14 to 02/16/15   Authorization - Visit Number 6   Authorization - Number of Visits 10   PT Start Time 0937   PT Stop Time 1015   PT Time Calculation (min) 38 min   Activity Tolerance Patient tolerated treatment well   Behavior During Therapy [facility] for tasks assessed/performed      Past Medical History  Diagnosis Date  . Hypertension   . Hyperlipidemia   . Glaucoma   . GERD (gastroesophageal reflux disease)     Past Surgical History  Procedure Laterality Date  . Cholecystectomy    . Knee arthroscopy with lateral menisectomy Left 12/11/2014    Procedure: KNEE ARTHROSCOPY WITH LATERAL AND MEDIAL MENISECTOMY;  Surgeon: Vickki Hearing, MD;  Location: [facility];  Service: Orthopedics;  Laterality: Left;  . Chondroplasty Left 12/11/2014    Procedure: CHONDROPLASTY LEFT MEDIAL FEMORAL CONDYLE;  Surgeon: Vickki Hearing, MD;  Location: [facility];  Service: Orthopedics;  Laterality: Left;    There were no vitals filed for this visit.  Visit Diagnosis:  S/P arthroscopic surgery of left knee  Stiffness of knee joint, left  Generalized muscle weakness  Impaired gait and mobility      Subjective Assessment - 01/07/15 0940    Subjective Pt reports that she has some pain in her L knee. She states that she feels that she is walking a little bit better recently.   Currently in Pain? Yes   Pain Score 2                          [facility] Adult PT Treatment/Exercise - 01/07/15  0001    Knee/Hip Exercises: Stretches   Active Hamstring Stretch Both;3 reps;30 seconds   Active Hamstring Stretch Limitations 14 inch box    Gastroc Stretch Both;3 reps;30 seconds   Gastroc Stretch Limitations slantboard   Knee/Hip Exercises: Aerobic   Nustep 10 minutes level 3 hils #3 LE only   Knee/Hip Exercises: Standing   Forward Lunges Both;15 reps   Forward Lunges Limitations 2 inch box   Side Lunges Both;10 reps   Side Lunges Limitations 2 inch box   Terminal Knee Extension Left;15 reps;Theraband   Theraband Level (Terminal Knee Extension) Level 3 (Green)   Lateral Step Up 15 reps;Hand Hold: 2;Step Height: 6"   Lateral Step Up Limitations 6 inch box   Forward Step Up Both;15 reps;Hand Hold: 1;Step Height: 6"   Forward Step Up Limitations 6 inch box    Step Down Left;10 reps;Step Height: 2"   Other Standing Knee Exercises single leg balance reach 5x each direction, x 5 on airex   Other Standing Knee Exercises sidestepping RTB x 2 RT                  PT Short Term Goals - 12/22/14 1025    PT SHORT TERM GOAL #1   Title Pt will improve strength for functional mobility as  evidenced by improved 5xSTS of <9.5 seconds.   Status On-going   PT SHORT TERM GOAL #2   Title Pt will improve functional mobility as evidence by withing 90% of norm values for age matched populations.    Status On-going   PT SHORT TERM GOAL #3   Title Pt will improve joint mobility in L knee to at equal that of R knee.    Status On-going   PT SHORT TERM GOAL #4   Title Pt will tolerate performance of 12 steps with 1 HR and step to gait to improve access to basement for IADL.    Status On-going           PT Long Term Goals - 12/17/14 1551    PT LONG TERM GOAL #1   Title Pt will improve strength for functional mobility as evidenced by improved 5xSTS of <8.3 seconds.   Baseline 12.03s   Time 8   Period Weeks   Status New   PT LONG TERM GOAL #2   Title Pt will improve functional  mobility as evidence by withing100% of norm values for age matched populations.    Time 8   Period Weeks   Status New   PT LONG TERM GOAL #3   Title --   PT LONG TERM GOAL #4   Title Pt will tolerate performance of 12 steps with  0-HR, carrying a 10 lb load and step-over gait to improve access to basement for IADL.    Baseline unable to perform.    Time 8   Period Weeks   Status New               Plan - 01/07/15 1014    Clinical Impression Statement Continued with functional strengthening today, adding forward step down and balance training on unstable surface. Pt was able to complete all therex in today's treatment without any c/o increased pain. She demonstrates good dynamic balance on unstable surface, and is appopriate to progress to higher level balance training.    PT Next Visit Plan Progress step downs if tolerated, continue with balance training on unstable surface        Problem List Patient Active Problem List   Diagnosis Date Noted  . Medial meniscus, posterior horn derangement   . Meniscus, lateral, derangement   . Primary osteoarthritis of left knee   . Annual physical exam 11/15/2014  . Left knee pain 11/15/2014  . Need for vaccination with 13-polyvalent pneumococcal conjugate vaccine 11/15/2014  . IFG (impaired fasting glucose) 10/13/2013  . ALLERGIC RHINITIS, SEASONAL 03/30/2008  . Hyperlipidemia LDL goal <100 09/17/2007  . Essential hypertension 09/17/2007  . GERD 09/17/2007  . Osteopenia 09/17/2007    Leona Singleton, PT, DPT [phone] 01/07/2015, 12:03 PM  [place] [facility] [address] [place], [place], [address] Phone: [phone]   Fax:  [phone]

## 2015-01-12 ENCOUNTER — Ambulatory Visit (HOSPITAL_COMMUNITY): Payer: Medicare HMO

## 2015-01-12 DIAGNOSIS — M25562 Pain in left knee: Secondary | ICD-10-CM

## 2015-01-12 DIAGNOSIS — M6281 Muscle weakness (generalized): Secondary | ICD-10-CM

## 2015-01-12 DIAGNOSIS — M25662 Stiffness of left knee, not elsewhere classified: Secondary | ICD-10-CM

## 2015-01-12 DIAGNOSIS — Z9889 Other specified postprocedural states: Secondary | ICD-10-CM

## 2015-01-12 DIAGNOSIS — R2689 Other abnormalities of gait and mobility: Secondary | ICD-10-CM

## 2015-01-12 NOTE — Patient Instructions (Signed)
Continue to progress ambulation at home. Continue with HEP as indicated previously.

## 2015-01-12 NOTE — Therapy (Signed)
[facility] [address] [place], [place], [address] Phone: [phone]   Fax:  [phone]  Physical Therapy Treatment  Patient Details  Name: Colleen [name] MRN: [id number] Date of Birth: 1945/08/28 Referring Provider:  Vickki Hearing, MD  Encounter Date: 01/12/2015      PT End of Session - 01/12/15 1140    Visit Number 7   Number of Visits 16   Date for PT Re-Evaluation 01/17/15   Authorization Type Medicare    Authorization Time Period 12/17/14 to 02/16/15   Authorization - Visit Number 7   Authorization - Number of Visits 10   PT Start Time 1019   PT Stop Time 1059   PT Time Calculation (min) 40 min   Activity Tolerance Patient tolerated treatment well;No increased pain   Behavior During Therapy [facility] for tasks assessed/performed      Past Medical History  Diagnosis Date  . Hypertension   . Hyperlipidemia   . Glaucoma   . GERD (gastroesophageal reflux disease)     Past Surgical History  Procedure Laterality Date  . Cholecystectomy    . Knee arthroscopy with lateral menisectomy Left 12/11/2014    Procedure: KNEE ARTHROSCOPY WITH LATERAL AND MEDIAL MENISECTOMY;  Surgeon: Vickki Hearing, MD;  Location: [facility];  Service: Orthopedics;  Laterality: Left;  . Chondroplasty Left 12/11/2014    Procedure: CHONDROPLASTY LEFT MEDIAL FEMORAL CONDYLE;  Surgeon: Vickki Hearing, MD;  Location: [facility];  Service: Orthopedics;  Laterality: Left;    There were no vitals filed for this visit.  Visit Diagnosis:  S/P arthroscopic surgery of left knee  Stiffness of knee joint, left  Generalized muscle weakness  Impaired gait and mobility  Pain in knee joint, left      Subjective Assessment - 01/12/15 1022    Subjective Pt reports some mild discomfort around the knee cap, but denies pain. She thinks she is doing very well, and reports only balance being a problem, especially during walking.    Pertinent History Pt reports long  history of knee arthitic pain in both knees, which was managed well by regular execise.     How long can you sit comfortably? 2+ hours    How long can you stand comfortably? 1 hour or more, unsure    How long can you walk comfortably? feels she could tolerate window shopping for up to 4 hours.    Patient Stated Goals Return to [facility] programs, and decrease new pain and stiffness in knee.    Currently in Pain? No/denies                         [facility] Adult PT Treatment/Exercise - 01/12/15 0001    Ambulation/Gait   Gait Comments Stairs 1x16 hands free; 1x16 with 5000g ball and 1 railing.    Knee/Hip Exercises: Standing   Lateral Step Up Both;2 sets;10 reps;Hand Hold: 0;Step Height: 6"  4" box + 2" airex   Lateral Step Up Limitations 4" box + 2" airex   Forward Step Up 2 sets;10 reps;Step Height: 6";Left  4"box + airex   Forward Step Up Limitations Up and over   Functional Squat 2 sets;10 reps  tapping chair c bottom   Gait Training 2x25ft retro ambulation c step-through gait  2x63ft lateral ambulation fwd flexed hands on knees.   Other Standing Knee Exercises SLS: 3x30sec bilat firm surface   Other Standing Knee Exercises Tandem Stance 30sec bilat  c trunk rotation c balll  3x30sec bilat 3000g yellow ball                PT Education - 01/12/15 1140    Education provided No          PT Short Term Goals - 12/22/14 1025    PT SHORT TERM GOAL #1   Title Pt will improve strength for functional mobility as evidenced by improved 5xSTS of <9.5 seconds.   Status On-going   PT SHORT TERM GOAL #2   Title Pt will improve functional mobility as evidence by withing 90% of norm values for age matched populations.    Status On-going   PT SHORT TERM GOAL #3   Title Pt will improve joint mobility in L knee to at equal that of R knee.    Status On-going   PT SHORT TERM GOAL #4   Title Pt will tolerate performance of 12 steps with 1 HR and step to gait to improve  access to basement for IADL.    Status On-going           PT Long Term Goals - 12/17/14 1551    PT LONG TERM GOAL #1   Title Pt will improve strength for functional mobility as evidenced by improved 5xSTS of <8.3 seconds.   Baseline 12.03s   Time 8   Period Weeks   Status New   PT LONG TERM GOAL #2   Title Pt will improve functional mobility as evidence by withing100% of norm values for age matched populations.    Time 8   Period Weeks   Status New   PT LONG TERM GOAL #3   Title --   PT LONG TERM GOAL #4   Title Pt will tolerate performance of 12 steps with  0-HR, carrying a 10 lb load and step-over gait to improve access to basement for IADL.    Baseline unable to perform.    Time 8   Period Weeks   Status New               Plan - 01/12/15 1141    Clinical Impression Statement Pt is making excellent progress in strength, pain management, activity tolerance, and balance; Pt continues to show the most limitations in dynamic balance activities such as stairs and fucntional overhead tasks. Will contiue to advance pt progress  dynamic stabilization activities.    Pt will benefit from skilled therapeutic intervention in order to improve on the following deficits Abnormal gait;Decreased activity tolerance;Decreased balance;Decreased mobility;Decreased range of motion;Decreased strength;Increased edema   Rehab Potential Good   PT Frequency 2x / week   PT Duration 8 weeks   PT Treatment/Interventions Gait training;Functional mobility training;Stair training;Therapeutic activities;Therapeutic exercise;Balance training;Manual techniques   PT Next Visit Plan Progress step downs if tolerated, continue with balance training on unstable surface   PT Home Exercise Plan No changes this session.    Consulted and Agree with Plan of Care Patient        Problem List Patient Active Problem List   Diagnosis Date Noted  . Medial meniscus, posterior horn derangement   .  Meniscus, lateral, derangement   . Primary osteoarthritis of left knee   . Annual physical exam 11/15/2014  . Left knee pain 11/15/2014  . Need for vaccination with 13-polyvalent pneumococcal conjugate vaccine 11/15/2014  . IFG (impaired fasting glucose) 10/13/2013  . ALLERGIC RHINITIS, SEASONAL 03/30/2008  . Hyperlipidemia LDL goal <100 09/17/2007  . Essential hypertension  09/17/2007  . GERD 09/17/2007  . Osteopenia 09/17/2007    Buccola,Allan C 01/12/2015, 11:48 AM  11:48 AM  Rosamaria Lints, PT, DPT [place] License # [id number]       [facility] [address] [place], [place], [address] Phone: [phone]   Fax:  [phone]

## 2015-01-14 ENCOUNTER — Ambulatory Visit (HOSPITAL_COMMUNITY): Payer: Medicare HMO

## 2015-01-14 DIAGNOSIS — Z9889 Other specified postprocedural states: Secondary | ICD-10-CM

## 2015-01-14 DIAGNOSIS — M6281 Muscle weakness (generalized): Secondary | ICD-10-CM

## 2015-01-14 DIAGNOSIS — R2689 Other abnormalities of gait and mobility: Secondary | ICD-10-CM

## 2015-01-14 DIAGNOSIS — M25562 Pain in left knee: Secondary | ICD-10-CM

## 2015-01-14 DIAGNOSIS — M25662 Stiffness of left knee, not elsewhere classified: Secondary | ICD-10-CM

## 2015-01-14 NOTE — Therapy (Signed)
[place], [place], [address] Phone: [phone]   Fax:  [phone]  Physical Therapy Treatment  Patient Details  Name: Colleen [name] MRN: [id number] Date of Birth: 02-Nov-1945 Referring Provider:  Carole Civil, MD  Encounter Date: 01/14/2015      PT End of Session - 01/14/15 1656    Visit Number 8   Number of Visits 16   Date for PT Re-Evaluation 02/16/15   Authorization Type Medicare    Authorization Time Period 12/17/14 to 02/16/15   Authorization - Visit Number 8   Authorization - Number of Visits 10   PT Start Time 1025   PT Stop Time 1100   PT Time Calculation (min) 35 min   Activity Tolerance Patient tolerated treatment well;No increased pain   Behavior During Therapy [facility] for tasks assessed/performed      Past Medical History  Diagnosis Date  . Hypertension   . Hyperlipidemia   . Glaucoma   . GERD (gastroesophageal reflux disease)     Past Surgical History  Procedure Laterality Date  . Cholecystectomy    . Knee arthroscopy with lateral menisectomy Left 12/11/2014    Procedure: KNEE ARTHROSCOPY WITH LATERAL AND MEDIAL MENISECTOMY;  Surgeon: Carole Civil, MD;  Location: [facility];  Service: Orthopedics;  Laterality: Left;  . Chondroplasty Left 12/11/2014    Procedure: CHONDROPLASTY LEFT MEDIAL FEMORAL CONDYLE;  Surgeon: Carole Civil, MD;  Location: [facility];  Service: Orthopedics;  Laterality: Left;    There were no vitals filed for this visit.  Visit Diagnosis:  S/P arthroscopic surgery of left knee  Stiffness of knee joint, left  Generalized muscle weakness  Impaired gait and mobility  Pain in knee joint, left      Subjective Assessment - 01/14/15 1652    Subjective Pt reports she is doing well, with minimal tightness in knee adn denying [name]/o pain. Pt reports she has been trying to be more active on her feet at home and has even walked down to the basement 2x, but was very  sore later that night.    Pertinent History Pt reports long history of knee arthitic pain in both knees, which was managed well by regular execise.     Currently in Pain? No/denies                         [facility] Adult PT Treatment/Exercise - 01/14/15 0001    Transfers   Five time sit to stand comments  8.37s   Ambulation/Gait   Ambulation/Gait Yes   Gait Comments 6 minute walk test: 1290   Knee/Hip Exercises: Stretches   Active Hamstring Stretch Left;3 reps;30 seconds  seated in chair, L leg extended [name] forward lunge   Gastroc Stretch 3 reps;30 seconds;Left  hanging over step at home.    Knee/Hip Exercises: Standing   Stairs [name] 5000g ball; 8 stairs [name] 1HR, 8 stairs s HR                PT Education - 01/14/15 1655    Education provided Yes   Education Details Importance of progressing regular gait training at home, and updates to HEP.    Person(s) Educated Patient   Methods Explanation   Comprehension Verbalized understanding          PT Short Term Goals - 01/14/15 1037    PT SHORT TERM GOAL #1   Title Pt will improve strength for  functional mobility as evidenced by improved 5xSTS of <9.5 seconds.   Baseline 12.03 seconds   Time 4   Period Weeks   Status Achieved   PT SHORT TERM GOAL #2   Title Pt will improve functional mobility as evidence by 6MWT withing 90% of norm values for age matched populations.    Baseline Pt is currently at 83% of goal on 9/8 with 6MWT of 1290.    Time 4   Period Weeks   Status Partially Met   PT SHORT TERM GOAL #3   Title Pt will improve joint mobility in L knee to at equal that of R knee.    Baseline limited on L .    Time 4   Period Weeks   Status Partially Met   PT SHORT TERM GOAL #4   Title Pt will tolerate performance of 12 steps with 1 HR and step to gait to improve access to basement for IADL.    Baseline unable.    Time 4   Status Achieved           PT Long Term Goals - 01/14/15 1043    PT LONG  TERM GOAL #1   Title Pt will improve strength for functional mobility as evidenced by improved 5xSTS of <8.3 seconds.   Baseline 8.3 seconds on    Status Achieved   PT LONG TERM GOAL #2   Title Pt will improve functional mobility as evidence by 6MWT withing100% of norm values for age matched populations.    Baseline 1290 on 9/8   Time 8   Period Weeks   Status New   PT LONG TERM GOAL #4   Title Pt will tolerate performance of 12 steps with  1-HR, carrying a 10 lb load and step-over gait to improve access to basement for IADL.    Time 8   Period Weeks   Status Achieved               Plan - 01/14/15 1658    Clinical Impression Statement Pt making excellent progress, showing improvements in 5x sit-to-stand time, six minute  walk test, and gait on stairs. Pt shoudl continue to focus on improving knee ROM and gait speed to meet all goals. Pt HEP has been updated to reflect most recent  updates to functional limitations.    Pt will benefit from skilled therapeutic intervention in order to improve on the following deficits Abnormal gait;Decreased activity tolerance;Decreased balance;Decreased mobility;Decreased range of motion;Decreased strength;Increased edema   Rehab Potential Good   PT Frequency 2x / week   PT Duration 8 weeks   PT Treatment/Interventions Gait training;Functional mobility training;Stair training;Therapeutic activities;Therapeutic exercise;Balance training;Manual techniques   PT Next Visit Plan review new HEP for acuracy; progress gait speed, dynamic balance, and ROM.    PT Home Exercise Plan Update on 9/8   Consulted and Agree with Plan of Care Patient        Problem List Patient Active Problem List   Diagnosis Date Noted  . Medial meniscus, posterior horn derangement   . Meniscus, lateral, derangement   . Primary osteoarthritis of left knee   . Annual physical exam 11/15/2014  . Left knee pain 11/15/2014  . Need for vaccination with 13-polyvalent  pneumococcal conjugate vaccine 11/15/2014  . IFG (impaired fasting glucose) 10/13/2013  . ALLERGIC RHINITIS, SEASONAL 03/30/2008  . Hyperlipidemia LDL goal <100 09/17/2007  . Essential hypertension 09/17/2007  . GERD 09/17/2007  . Osteopenia 09/17/2007    Colleen [name]   01/14/2015, 5:03 PM  5:03 PM  Etta Grandchild, PT, DPT [place] License # [id number]       [name] [address] [place], [place], [address] Phone: [phone]   Fax:  [phone]

## 2015-01-14 NOTE — Patient Instructions (Signed)
   Hanging left heel off of step, gently stretch the L calf for 30 seconds 3 times. Perform 2 times daily at home.     Sitting in a chair or bedside, extend the Left leg with straight knee and lean weight forward antuil a stretch behind the knee is felt. Hold 30 seconds 3 times. Perform twice daily.

## 2015-01-19 ENCOUNTER — Ambulatory Visit (HOSPITAL_COMMUNITY): Payer: Medicare HMO | Admitting: Physical Therapy

## 2015-01-19 DIAGNOSIS — Z9889 Other specified postprocedural states: Secondary | ICD-10-CM

## 2015-01-19 DIAGNOSIS — R2689 Other abnormalities of gait and mobility: Secondary | ICD-10-CM

## 2015-01-19 DIAGNOSIS — M6281 Muscle weakness (generalized): Secondary | ICD-10-CM

## 2015-01-19 DIAGNOSIS — M25662 Stiffness of left knee, not elsewhere classified: Secondary | ICD-10-CM

## 2015-01-19 DIAGNOSIS — M25562 Pain in left knee: Secondary | ICD-10-CM

## 2015-01-19 NOTE — Therapy (Signed)
Colleen [name], [place], [address] Phone: [phone]   Fax:  [phone]  Physical Therapy Treatment  Patient Details  Name: [name] MRN: [id number] Date of Birth: [birth year] Referring Provider:  Carole Civil, MD  Encounter Date: 01/19/2015      PT End of Session - 01/19/15 1049    Visit Number 9   Number of Visits 16   Date for PT Re-Evaluation 02/16/15   Authorization Type Medicare    Authorization Time Period 12/17/14 to 02/16/15   Authorization - Visit Number 9   Authorization - Number of Visits 10   PT Start Time 9233   PT Stop Time 1056   PT Time Calculation (min) 44 min   Activity Tolerance Patient tolerated treatment well;No increased pain   Behavior During Therapy [facility] for tasks assessed/performed      Past Medical History  Diagnosis Date  . Hypertension   . Hyperlipidemia   . Glaucoma   . GERD (gastroesophageal reflux disease)     Past Surgical History  Procedure Laterality Date  . Cholecystectomy    . Knee arthroscopy with lateral menisectomy Left 12/11/2014    Procedure: KNEE ARTHROSCOPY WITH LATERAL AND MEDIAL MENISECTOMY;  Surgeon: Carole Civil, MD;  Location: [facility];  Service: Orthopedics;  Laterality: Left;  . Chondroplasty Left 12/11/2014    Procedure: CHONDROPLASTY LEFT MEDIAL FEMORAL CONDYLE;  Surgeon: Carole Civil, MD;  Location: [facility];  Service: Orthopedics;  Laterality: Left;    There were no vitals filed for this visit.  Visit Diagnosis:  S/P arthroscopic surgery of left knee  Stiffness of knee joint, left  Generalized muscle weakness  Impaired gait and mobility  Pain in knee joint, left      Subjective Assessment - 01/19/15 1019    Subjective PT states she's having some tenderness and aching in her Lt knee but not really pain.   Currently in Pain? No/denies                         [facility] Adult PT Treatment/Exercise - 01/19/15  1020    Knee/Hip Exercises: Stretches   Active Hamstring Stretch Left;3 reps;30 seconds   Active Hamstring Stretch Limitations 14 inch box    Gastroc Stretch 3 reps;30 seconds;Left   Gastroc Stretch Limitations slantboard   Knee/Hip Exercises: Aerobic   Nustep 10 minutes level 3 hils #3 LE only   Knee/Hip Exercises: Standing   Forward Lunges Both;10 reps   Forward Lunges Limitations airex no UE's   Side Lunges Both;10 reps   Side Lunges Limitations airex no UE's   Lateral Step Up Both;10 reps;Hand Hold: 0;Step Height: 6"   Lateral Step Up Limitations 4" box + 2" airex   Forward Step Up 10 reps;Step Height: 6";Left   Forward Step Up Limitations Up and over   SLS on airex 30" each   Other Standing Knee Exercises tandem gait, retro tandem gait and sidestepping with red theraband 2RT each                  PT Short Term Goals - 01/14/15 1037    PT SHORT TERM GOAL #1   Title Pt will improve strength for functional mobility as evidenced by improved 5xSTS of <9.5 seconds.   Baseline 12.03 seconds   Time 4   Period Weeks   Status Achieved   PT SHORT TERM GOAL #2   Title Pt will  improve functional mobility as evidence by 6MWT withing 90% of norm values for age matched populations.    Baseline Pt is currently at 83% of goal on 9/8 with 6MWT of 1290.    Time 4   Period Weeks   Status Partially Met   PT SHORT TERM GOAL #3   Title Pt will improve joint mobility in L knee to at equal that of R knee.    Baseline limited on L .    Time 4   Period Weeks   Status Partially Met   PT SHORT TERM GOAL #4   Title Pt will tolerate performance of 12 steps with 1 HR and step to gait to improve access to basement for IADL.    Baseline unable.    Time 4   Status Achieved           PT Long Term Goals - 01/14/15 1043    PT LONG TERM GOAL #1   Title Pt will improve strength for functional mobility as evidenced by improved 5xSTS of <8.3 seconds.   Baseline 8.3 seconds on    Status  Achieved   PT LONG TERM GOAL #2   Title Pt will improve functional mobility as evidence by 6MWT withing100% of norm values for age matched populations.    Baseline 1290 on 9/8   Time 8   Period Weeks   Status New   PT LONG TERM GOAL #4   Title Pt will tolerate performance of 12 steps with  1-HR, carrying a 10 lb load and step-over gait to improve access to basement for IADL.    Time 8   Period Weeks   Status Achieved               Plan - 01/19/15 1050    Clinical Impression Statement Progressed with tandem and retro tandem gait today.  Pt able to complete with supervision.  Good form with sit to stands.  Added airex today with forward and lateral lunges without UE assist to further challenge stability and functional strength.    PT Next Visit Plan Progress gait speed and  dynamic balance.  Add hurdles and balance beam next session   Consulted and Agree with Plan of Care Patient        Problem List Patient Active Problem List   Diagnosis Date Noted  . Medial meniscus, posterior horn derangement   . Meniscus, lateral, derangement   . Primary osteoarthritis of left knee   . Annual physical exam 11/15/2014  . Left knee pain 11/15/2014  . Need for vaccination with 13-polyvalent pneumococcal conjugate vaccine 11/15/2014  . IFG (impaired fasting glucose) 10/13/2013  . ALLERGIC RHINITIS, SEASONAL 03/30/2008  . Hyperlipidemia LDL goal <100 09/17/2007  . Essential hypertension 09/17/2007  . GERD 09/17/2007  . Osteopenia 09/17/2007    Teena Irani, PTA/CLT [phone]  01/19/2015, 10:54 AM  [place], [place], [address] Phone: [phone]   Fax:  [phone]

## 2015-01-21 ENCOUNTER — Ambulatory Visit (HOSPITAL_COMMUNITY): Payer: Medicare HMO

## 2015-01-21 DIAGNOSIS — M6281 Muscle weakness (generalized): Secondary | ICD-10-CM

## 2015-01-21 DIAGNOSIS — M25662 Stiffness of left knee, not elsewhere classified: Secondary | ICD-10-CM

## 2015-01-21 DIAGNOSIS — R2689 Other abnormalities of gait and mobility: Secondary | ICD-10-CM

## 2015-01-21 DIAGNOSIS — M25562 Pain in left knee: Secondary | ICD-10-CM

## 2015-01-21 DIAGNOSIS — Z9889 Other specified postprocedural states: Secondary | ICD-10-CM | POA: Diagnosis not present

## 2015-01-21 NOTE — Therapy (Signed)
[place], [place], [address] Phone: [phone]   Fax:  [phone]  Physical Therapy Treatment  Patient Details  Name: Colleen [name] MRN: [id number] Date of Birth: August 01, 1945 Referring Provider:  Carole Civil, MD  Encounter Date: 01/21/2015      PT End of Session - 01/21/15 1106    Visit Number 10   Number of Visits 16   Date for PT Re-Evaluation 02/16/15   Authorization Type Medicare    Authorization Time Period 12/17/14 to 02/16/15   Authorization - Visit Number 10   Authorization - Number of Visits 10   PT Start Time 7062   PT Stop Time 1057   PT Time Calculation (min) 41 min   Activity Tolerance Patient tolerated treatment well;No increased pain   Behavior During Therapy [facility] for tasks assessed/performed      Past Medical History  Diagnosis Date  . Hypertension   . Hyperlipidemia   . Glaucoma   . GERD (gastroesophageal reflux disease)     Past Surgical History  Procedure Laterality Date  . Cholecystectomy    . Knee arthroscopy with lateral menisectomy Left 12/11/2014    Procedure: KNEE ARTHROSCOPY WITH LATERAL AND MEDIAL MENISECTOMY;  Surgeon: Carole Civil, MD;  Location: [facility];  Service: Orthopedics;  Laterality: Left;  . Chondroplasty Left 12/11/2014    Procedure: CHONDROPLASTY LEFT MEDIAL FEMORAL CONDYLE;  Surgeon: Carole Civil, MD;  Location: [facility];  Service: Orthopedics;  Laterality: Left;    There were no vitals filed for this visit.  Visit Diagnosis:  Stiffness of knee joint, left  Generalized muscle weakness  Impaired gait and mobility  Pain in knee joint, left      Subjective Assessment - 01/21/15 1104    Subjective Pt reports she is doing well overall, that she continues to practice the stairs at home, as well as increasing her walking. She says that the stairs continue to be challenging, but she is not as sore as she has been.    Pertinent History Pt reports  long history of knee arthitic pain in both knees, which was managed well by regular execise.                           [facility] Adult PT Treatment/Exercise - 01/21/15 0001    Ambulation/Gait   Ambulation/Gait Yes   Gait Comments 6RT   Knee/Hip Exercises: Standing   Lateral Step Up Both;Step Height: 8";15 reps;2 sets   Lateral Step Up Limitations 8" box, no foam   Forward Step Up Both;2 sets;15 reps;Step Height: 8"  firm   Forward Step Up Limitations increased height, starting c firm surface  Req verbal cues for correct sequencing   SLS on balance beam 4x30" bilat   Gait Training 4x62f retro ambulation c step-through gait  2x394flateral ambulation fwd flexed hands on knees.   Knee/Hip Exercises: Supine   Bridges Strengthening;Both;2 sets  single leg bridges 2x8                PT Education - 01/21/15 1105    Education provided No          PT Short Term Goals - 01/14/15 1037    PT SHORT TERM GOAL #1   Title Pt will improve strength for functional mobility as evidenced by improved 5xSTS of <9.5 seconds.   Baseline 12.03 seconds   Time 4   Period Weeks  Status Achieved   PT SHORT TERM GOAL #2   Title Pt will improve functional mobility as evidence by 6MWT withing 90% of norm values for age matched populations.    Baseline Pt is currently at 83% of goal on 9/8 with 6MWT of 1290.    Time 4   Period Weeks   Status Partially Met   PT SHORT TERM GOAL #3   Title Pt will improve joint mobility in L knee to at equal that of R knee.    Baseline limited on L .    Time 4   Period Weeks   Status Partially Met   PT SHORT TERM GOAL #4   Title Pt will tolerate performance of 12 steps with 1 HR and step to gait to improve access to basement for IADL.    Baseline unable.    Time 4   Status Achieved           PT Long Term Goals - 01/14/15 1043    PT LONG TERM GOAL #1   Title Pt will improve strength for functional mobility as evidenced by improved 5xSTS  of <8.3 seconds.   Baseline 8.3 seconds on    Status Achieved   PT LONG TERM GOAL #2   Title Pt will improve functional mobility as evidence by 6MWT withing100% of norm values for age matched populations.    Baseline 1290 on 9/8   Time 8   Period Weeks   Status New   PT LONG TERM GOAL #4   Title Pt will tolerate performance of 12 steps with  1-HR, carrying a 10 lb load and step-over gait to improve access to basement for IADL.    Time 8   Period Weeks   Status Achieved               Plan - 02/12/2015 1108    Clinical Impression Statement Pt continues to demonstrate progress toward goal as evidenced by progresssion of therex today, and improved tolerance to functional mobility. Pt continues to have most difficulty with balance activities and progression of six minute walk test, but is able to demonstrate a gait speed of 1.61ms indicative of low falls risk and appropriate for community ambulation.  Ptt has met most goals at this time, and is is asked to continue with home program independently until follow up with surgeon on 9/26. She is scheduled for 9/27 for what is likely her last session, during which she will receive detail education on her advanced HEP for DC.    Pt will benefit from skilled therapeutic intervention in order to improve on the following deficits Abnormal gait;Decreased activity tolerance;Decreased balance;Decreased mobility;Decreased range of motion;Decreased strength;Increased edema   Rehab Potential Good   PT Frequency 2x / week   PT Duration 8 weeks   PT Treatment/Interventions Gait training;Functional mobility training;Stair training;Therapeutic activities;Therapeutic exercise;Balance training;Manual techniques   PT Next Visit Plan Education for advanced HEP and DC.    PT Home Exercise Plan no changes this session.    Consulted and Agree with Plan of Care Patient          G-Codes - 0October 07, 20161115    Functional Assessment Tool Used Clinical judgment     Functional Limitation Mobility: Walking and moving around   Mobility: Walking and Moving Around Current Status [phone] At least 1 percent but less than 20 percent impaired, limited or restricted   Mobility: Walking and Moving Around Goal Status (([id number] At least 1 percent but less than  20 percent impaired, limited or restricted      Problem List Patient Active Problem List   Diagnosis Date Noted  . Medial meniscus, posterior horn derangement   . Meniscus, lateral, derangement   . Primary osteoarthritis of left knee   . Annual physical exam 11/15/2014  . Left knee pain 11/15/2014  . Need for vaccination with 13-polyvalent pneumococcal conjugate vaccine 11/15/2014  . IFG (impaired fasting glucose) 10/13/2013  . ALLERGIC RHINITIS, SEASONAL 03/30/2008  . Hyperlipidemia LDL goal <100 09/17/2007  . Essential hypertension 09/17/2007  . GERD 09/17/2007  . Osteopenia 09/17/2007    Colleen [name] C 01/21/2015, 11:17 AM  11:17 AM  Etta Grandchild, PT, DPT  License # [id number]       [facility] [address] [place], [place], [address] Phone: [phone]   Fax:  [phone]

## 2015-01-26 ENCOUNTER — Encounter (HOSPITAL_COMMUNITY): Payer: Medicare HMO

## 2015-01-28 ENCOUNTER — Encounter (HOSPITAL_COMMUNITY): Payer: Medicare HMO

## 2015-02-01 ENCOUNTER — Ambulatory Visit (INDEPENDENT_AMBULATORY_CARE_PROVIDER_SITE_OTHER): Payer: Medicare HMO | Admitting: Orthopedic Surgery

## 2015-02-01 VITALS — BP 131/69 | Ht 61.0 in | Wt 142.0 lb

## 2015-02-01 DIAGNOSIS — Z9889 Other specified postprocedural states: Secondary | ICD-10-CM

## 2015-02-01 DIAGNOSIS — Z4789 Encounter for other orthopedic aftercare: Secondary | ICD-10-CM

## 2015-02-01 NOTE — Progress Notes (Signed)
Patient ID: Colleen [name], female   DOB: Nov 19, 1945, 69 y.o.   MRN: [id number]  Follow up visit  Chief Complaint  Patient presents with  . Follow-up    4 week recheck on left knee, SALK. DOS 12-09-14.    BP 131/69 mmHg  Ht  (1.549 m)  Wt 142 lb (64.411 kg)  BMI 26.84 kg/m2  No diagnosis found.  Patient had knee scope and did well she does have some arthritis in that knee along with her medial meniscectomy. Pain is improved significantly. Her flexion is good her straight leg raise is normal she can be released

## 2015-02-02 ENCOUNTER — Ambulatory Visit (HOSPITAL_COMMUNITY): Payer: Medicare HMO

## 2015-02-02 DIAGNOSIS — Z9889 Other specified postprocedural states: Secondary | ICD-10-CM | POA: Diagnosis not present

## 2015-02-02 DIAGNOSIS — R2689 Other abnormalities of gait and mobility: Secondary | ICD-10-CM

## 2015-02-02 DIAGNOSIS — M6281 Muscle weakness (generalized): Secondary | ICD-10-CM

## 2015-02-02 DIAGNOSIS — M25562 Pain in left knee: Secondary | ICD-10-CM

## 2015-02-02 DIAGNOSIS — M25662 Stiffness of left knee, not elsewhere classified: Secondary | ICD-10-CM

## 2015-02-02 NOTE — Patient Instructions (Signed)
-  Modified standing heel raises to include greater balance component and encouraged pt to increase to 40 reps over time.

## 2015-02-02 NOTE — Therapy (Signed)
[place], [place], [address] Phone: [phone]   Fax:  [phone]  Physical Therapy Treatment  Patient Details  Name: Colleen [name] MRN: [id number] Date of Birth: [birth year] Referring Provider:  Carole Civil, MD  Encounter Date: 02/02/2015      PT End of Session - 02/02/15 1528    Visit Number 11   Number of Visits 16   Date for PT Re-Evaluation 02/16/15   Authorization Type Medicare    Authorization Time Period 12/17/14 to 02/16/15   Authorization - Visit Number 11   Authorization - Number of Visits 10   PT Start Time 0104   PT Stop Time 0144   PT Time Calculation (min) 40 min   Activity Tolerance Patient tolerated treatment well;No increased pain   Behavior During Therapy [facility] for tasks assessed/performed      Past Medical History  Diagnosis Date  . Hypertension   . Hyperlipidemia   . Glaucoma   . GERD (gastroesophageal reflux disease)     Past Surgical History  Procedure Laterality Date  . Cholecystectomy    . Knee arthroscopy with lateral menisectomy Left 12/11/2014    Procedure: KNEE ARTHROSCOPY WITH LATERAL AND MEDIAL MENISECTOMY;  Surgeon: Carole Civil, MD;  Location: [facility];  Service: Orthopedics;  Laterality: Left;  . Chondroplasty Left 12/11/2014    Procedure: CHONDROPLASTY LEFT MEDIAL FEMORAL CONDYLE;  Surgeon: Carole Civil, MD;  Location: [facility];  Service: Orthopedics;  Laterality: Left;    There were no vitals filed for this visit.  Visit Diagnosis:  Stiffness of knee joint, left  Generalized muscle weakness  Impaired gait and mobility  Pain in knee joint, left  S/P arthroscopic surgery of left knee      Subjective Assessment - 02/02/15 1312    Subjective Pt reports success during time at home, improving tolerance to walking at home and prolonged standing at [employer]. Pt says stairs are getting easier with practice.    Pertinent History Pt reports long history  of knee arthitic pain in both knees, which was managed well by regular execise.     Patient Stated Goals Return to [facility] programs, and decrease new pain and stiffness in knee.    Currently in Pain? No/denies                         [facility] Adult PT Treatment/Exercise - 02/02/15 0001    Ambulation/Gait   Ambulation/Gait Yes   Ambulation Distance (Feet) 1350 Feet   Gait Comments 6RT   Knee/Hip Exercises: Stretches   Active Hamstring Stretch Left;3 reps;30 seconds  seated in chair   Active Hamstring Stretch Limitations seated in chair   Knee/Hip Exercises: Standing   Heel Raises Both;20 reps;2 sets   Heel Raises Limitations no UE support   Lateral Step Up Both;Step Height: 8";15 reps;2 sets   Lateral Step Up Limitations 6" box + 2" foam   Forward Step Up 2 sets;15 reps;Step Height: 6";Step Height: 2";Hand Hold: 0;Left  firm   Forward Step Up Limitations 6" box + 2" foam   SLS on airex 4x30" bilat  sinlge UE support (2 fingers)   Walking with Sports Cord 4x back wards, left right   Gait Training 4x38f heel walking; 4x30 retroamb             Balance Exercises - 02/02/15 1326    Balance Exercises: Standing   Standing Eyes Opened Narrow base  of support (BOS);Foam/compliant surface  Rotation 15x bilat           PT Education - 02/02/15 1527    Education provided Yes   Education Details explained advanced HEP to prepare for DC.    Person(s) Educated Patient   Methods Explanation   Comprehension Verbalized understanding          PT Short Term Goals - 01/14/15 1037    PT SHORT TERM GOAL #1   Title Pt will improve strength for functional mobility as evidenced by improved 5xSTS of <9.5 seconds.   Baseline 12.03 seconds   Time 4   Period Weeks   Status Achieved   PT SHORT TERM GOAL #2   Title Pt will improve functional mobility as evidence by 6MWT withing 90% of norm values for age matched populations.    Baseline Pt is currently at 83% of goal on 9/8  with 6MWT of 1290.    Time 4   Period Weeks   Status Partially Met   PT SHORT TERM GOAL #3   Title Pt will improve joint mobility in L knee to at equal that of R knee.    Baseline limited on L .    Time 4   Period Weeks   Status Partially Met   PT SHORT TERM GOAL #4   Title Pt will tolerate performance of 12 steps with 1 HR and step to gait to improve access to basement for IADL.    Baseline unable.    Time 4   Status Achieved           PT Long Term Goals - 01/14/15 1043    PT LONG TERM GOAL #1   Title Pt will improve strength for functional mobility as evidenced by improved 5xSTS of <8.3 seconds.   Baseline 8.3 seconds on    Status Achieved   PT LONG TERM GOAL #2   Title Pt will improve functional mobility as evidence by 6MWT withing100% of norm values for age matched populations.    Baseline 1290 on 9/8   Time 8   Period Weeks   Status New   PT LONG TERM GOAL #4   Title Pt will tolerate performance of 12 steps with  1-HR, carrying a 10 lb load and step-over gait to improve access to basement for IADL.    Time 8   Period Weeks   Status Achieved               Plan - 02/02/15 1530    Clinical Impression Statement Pt is ready for DC and this will serve as final visit. Pt has met almost all goals and is prepared to indep continue progress toward remaining goals after DC. Pt has had final visit with referring ortho who reports that he is pleased with her progress. PT signing off at this time.    Pt will benefit from skilled therapeutic intervention in order to improve on the following deficits Abnormal gait;Decreased activity tolerance;Decreased balance;Decreased mobility;Decreased range of motion;Decreased strength;Increased edema   Rehab Potential Good   PT Frequency 2x / week   PT Duration 8 weeks   PT Treatment/Interventions Gait training;Functional mobility training;Stair training;Therapeutic activities;Therapeutic exercise;Balance training;Manual techniques    PT Next Visit Plan None. Pt has finished all sesions. All goals met.    PT Home Exercise Plan updated to advanced HEP for DC.    Consulted and Agree with Plan of Care Patient  G-Codes - 02/02/15 1536    Functional Assessment Tool Used Clinical judgment    Functional Limitation Mobility: Walking and moving around   Mobility: Walking and Moving Around Current Status [phone]) At least 1 percent but less than 20 percent impaired, limited or restricted   Mobility: Walking and Moving Around Goal Status [phone]) At least 1 percent but less than 20 percent impaired, limited or restricted   Mobility: Walking and Moving Around Discharge Status [phone]) At least 1 percent but less than 20 percent impaired, limited or restricted      Problem List Patient Active Problem List   Diagnosis Date Noted  . Medial meniscus, posterior horn derangement   . Meniscus, lateral, derangement   . Primary osteoarthritis of left knee   . Annual physical exam 11/15/2014  . Left knee pain 11/15/2014  . Need for vaccination with 13-polyvalent pneumococcal conjugate vaccine 11/15/2014  . IFG (impaired fasting glucose) 10/13/2013  . ALLERGIC RHINITIS, SEASONAL 03/30/2008  . Hyperlipidemia LDL goal <100 09/17/2007  . Essential hypertension 09/17/2007  . GERD 09/17/2007  . Osteopenia 09/17/2007    PHYSICAL THERAPY DISCHARGE SUMMARY  Visits from Start of Care: 11  Current functional level related to goals / functional outcomes: Almost all goals met. Pt not within goal percentage of age-matched normative distance for 6 minute walk test, but continues to make progress indep.    Remaining deficits: Mild remaining deficits in ROM, which patient is able to continue to make progress toward s/p DC, and which do not appear to relate to functional impairments.    Education / Equipment: *see above  Plan: Patient agrees to discharge.  Patient goals were met. Patient is being discharged due to meeting the stated  rehab goals.  ?????        Buccola,Allan C 02/02/2015, 3:38 PM  3:38 PM  Etta Grandchild, PT, DPT [place] License # [id number]       [facility] [address] [place], [place], [address] Phone: [phone]   Fax:  [phone]

## 2015-03-06 DIAGNOSIS — R7301 Impaired fasting glucose: Secondary | ICD-10-CM | POA: Diagnosis not present

## 2015-03-06 DIAGNOSIS — I10 Essential (primary) hypertension: Secondary | ICD-10-CM | POA: Diagnosis not present

## 2015-03-06 DIAGNOSIS — E559 Vitamin D deficiency, unspecified: Secondary | ICD-10-CM | POA: Diagnosis not present

## 2015-03-06 DIAGNOSIS — E785 Hyperlipidemia, unspecified: Secondary | ICD-10-CM | POA: Diagnosis not present

## 2015-03-06 LAB — COMPLETE METABOLIC PANEL WITH GFR
ALT: 14 U/L (ref 6–29)
AST: 17 U/L (ref 10–35)
Albumin: 4.2 g/dL (ref 3.6–5.1)
Alkaline Phosphatase: 91 U/L (ref 33–130)
BUN: 13 mg/dL (ref 7–25)
CALCIUM: 9.5 mg/dL (ref 8.6–10.4)
CHLORIDE: 105 mmol/L (ref 98–110)
CO2: 27 mmol/L (ref 20–31)
Creat: 0.8 mg/dL (ref 0.50–0.99)
GFR, EST AFRICAN AMERICAN: 88 mL/min (ref >=60)
GFR, Est Non African American: 76 mL/min (ref >=60)
Glucose, Bld: 98 mg/dL (ref 65–99)
POTASSIUM: 4.2 mmol/L (ref 3.5–5.3)
Sodium: 139 mmol/L (ref 135–146)
Total Bilirubin: 0.4 mg/dL (ref 0.2–1.2)
Total Protein: 6.9 g/dL (ref 6.1–8.1)

## 2015-03-06 LAB — CBC
HEMATOCRIT: 36.8 % (ref 36.0–46.0)
HEMOGLOBIN: 12.6 g/dL (ref 12.0–15.0)
MCH: 30.1 pg (ref 26.0–34.0)
MCHC: 34.2 g/dL (ref 30.0–36.0)
MCV: 87.8 fL (ref 78.0–100.0)
MPV: 9 fL (ref 8.6–12.4)
Platelets: 265 10*3/uL (ref 150–400)
RBC: 4.19 MIL/uL (ref 3.87–5.11)
RDW: 12.4 % (ref 11.5–15.5)
WBC: 4.7 10*3/uL (ref 4.0–10.5)

## 2015-03-06 LAB — LIPID PANEL
CHOL/HDL RATIO: 4 ratio (ref <=5.0)
CHOLESTEROL: 228 mg/dL — AB (ref 125–200)
HDL: 57 mg/dL (ref >=46)
LDL CALC: 153 mg/dL — AB (ref <130)
Triglycerides: 90 mg/dL (ref <150)
VLDL: 18 mg/dL (ref <30)

## 2015-03-06 LAB — TSH: TSH: 1.304 u[IU]/mL (ref 0.350–4.500)

## 2015-03-07 LAB — HEMOGLOBIN A1C
Hgb A1c MFr Bld: 5.7 % — ABNORMAL HIGH (ref <5.7)
Mean Plasma Glucose: 117 mg/dL — ABNORMAL HIGH (ref <117)

## 2015-03-08 LAB — VITAMIN D 25 HYDROXY (VIT D DEFICIENCY, FRACTURES): Vit D, 25-Hydroxy: 29 ng/mL — ABNORMAL LOW (ref 30–100)

## 2015-03-09 ENCOUNTER — Telehealth: Payer: Self-pay | Admitting: Family Medicine

## 2015-03-09 ENCOUNTER — Encounter: Payer: Self-pay | Admitting: Family Medicine

## 2015-03-09 ENCOUNTER — Ambulatory Visit (INDEPENDENT_AMBULATORY_CARE_PROVIDER_SITE_OTHER): Payer: Medicare HMO | Admitting: Family Medicine

## 2015-03-09 VITALS — BP 138/80 | HR 77 | Resp 16 | Ht 61.0 in | Wt 148.8 lb

## 2015-03-09 DIAGNOSIS — K219 Gastro-esophageal reflux disease without esophagitis: Secondary | ICD-10-CM

## 2015-03-09 DIAGNOSIS — I10 Essential (primary) hypertension: Secondary | ICD-10-CM

## 2015-03-09 DIAGNOSIS — Z23 Encounter for immunization: Secondary | ICD-10-CM

## 2015-03-09 DIAGNOSIS — R7301 Impaired fasting glucose: Secondary | ICD-10-CM

## 2015-03-09 DIAGNOSIS — J301 Allergic rhinitis due to pollen: Secondary | ICD-10-CM

## 2015-03-09 DIAGNOSIS — E785 Hyperlipidemia, unspecified: Secondary | ICD-10-CM

## 2015-03-09 MED ORDER — LISINOPRIL-HYDROCHLOROTHIAZIDE 20-25 MG PO TABS: 1.0000 | ORAL_TABLET | Freq: Every day | ORAL | Status: DC

## 2015-03-09 MED ORDER — ALENDRONATE SODIUM 70 MG PO TABS: ORAL_TABLET | ORAL | Status: DC

## 2015-03-09 MED ORDER — FLUTICASONE PROPIONATE 50 MCG/ACT NA SUSP: 2.0000 | Freq: Every day | NASAL | Status: DC

## 2015-03-09 MED ORDER — FENOFIBRATE 145 MG PO TABS: 145.0000 mg | ORAL_TABLET | Freq: Every day | ORAL | Status: DC

## 2015-03-09 NOTE — Assessment & Plan Note (Signed)
Controlled, no change in medication DASH diet and commitment to daily physical activity for a minimum of 30 minutes discussed and encouraged, as a part of hypertension management. The importance of attaining a healthy weight is also discussed.  BP/Weight 03/09/2015 02/01/2015 01/04/2015 12/14/2014 12/11/2014 12/08/2014 11/18/2014  Systolic BP 138 131 133 139 137 142 -  Diastolic BP 80 69 74 73 69 47 -  Wt. (Lbs) 148.8 142 142 142 142 142 144  BMI 28.13 26.84 26.84 26.84 26.84 25.97 26.33

## 2015-03-09 NOTE — Assessment & Plan Note (Signed)
Improved Patient educated about the importance of limiting  Carbohydrate intake , the need to commit to daily physical activity for a minimum of 30 minutes , and to commit weight loss. The fact that changes in all these areas will reduce or eliminate all together the development of diabetes is stressed.   Diabetic Labs Latest Ref Rng 03/06/2015 12/08/2014 10/06/2014 03/03/2014 10/08/2013  HbA1c <5.7 % 5.7(H) - 5.8(H) 5.7(H) -  Chol 125 - 200 mg/dL 161(W228(H) - 960(A240(H) 540(J282(H) 209(H)  HDL >=46 mg/dL 57 - 51 60 49  Calc LDL <130 mg/dL 811(B153(H) - 147(W170(H) 295(A208(H) 135(H)  Triglycerides <150 mg/dL 90 - 93 72 [id number]  Creatinine 0.50 - 0.99 mg/dL 0.860.80 5.780.70 4.690.79 6.290.76 5.280.81   BP/Weight 03/09/2015 02/01/2015 01/04/2015 12/14/2014 12/11/2014 12/08/2014 11/18/2014  Systolic BP 138 131 133 139 137 142 -  Diastolic BP 80 69 74 73 69 47 -  Wt. (Lbs) 148.8 142 142 142 142 142 144  BMI 28.13 26.84 26.84 26.84 26.84 25.97 26.33   No flowsheet data found.

## 2015-03-09 NOTE — Assessment & Plan Note (Signed)
Controlled, no change in medication  

## 2015-03-09 NOTE — Telephone Encounter (Signed)
Colleen [name] was asked to call back after she got the information from her insurance company regarding her tetanus shot and patient states that is would fall under her Rx Plan

## 2015-03-09 NOTE — Telephone Encounter (Signed)
Noted  

## 2015-03-09 NOTE — Assessment & Plan Note (Signed)
Increased symptoms x 3 days , needs to resume flonase

## 2015-03-09 NOTE — Patient Instructions (Addendum)
Annual wellness in March, call if you need me sooner.  CONGRATS on improved labs, thankful that knee is better since surgery  Please work on good  health habits so that your health will improve. 1. Commitment to daily physical activity for 30 to 60  minutes, if you are able to do this.  2. Commitment to wise food choices. Aim for half of your  food intake to be vegetable and fruit, one quarter starchy foods, and one quarter protein. Try to eat on a regular schedule  3 meals per day, snacking between meals should be limited to vegetables or fruits or small portions of nuts. 64 ounces of water per day is generally recommended, unless you have specific health conditions, like heart failure or kidney failure where you will need to limit fluid intake.  3. Commitment to sufficient and a  good quality of physical and mental rest daily, generally between 6 to 8 hours per day.  WITH PERSISTANCE AND PERSEVERANCE, THE IMPOSSIBLE , BECOMES THE NORM!   Fasting lipid, cmp and hBA1C in March  Flu vaccine today  Check for coverage for TdAP with your mnsurance

## 2015-03-11 ENCOUNTER — Telehealth: Payer: Self-pay | Admitting: Family Medicine

## 2015-03-11 ENCOUNTER — Other Ambulatory Visit: Payer: Self-pay

## 2015-03-11 NOTE — Telephone Encounter (Signed)
Mrs. Colleen [name] is asking why her medication fenofibrate (TRICOR) 145 MG tablet  Was increased to 145mg , please advise?

## 2015-03-11 NOTE — Telephone Encounter (Signed)
Patient aware that the brand Tricor and fenofibric acid are comparable and that her dose really was not change.  She was just changed to the brand which was also in her chart.

## 2015-03-14 NOTE — Progress Notes (Signed)
Subjective:    Patient ID: Colleen SavannahAnnie W [name], female    DOB: 29-Dec-1945, 69 y.o.   MRN: [id number]  HPI   Colleen [name]     MRN: [id number]      DOB: 29-Dec-1945   HPI Colleen [name] is here for follow up and re-evaluation of chronic medical conditions, medication management and review of any available recent lab and radiology data.  Preventive health is updated, specifically  Cancer screening and Immunization.   Questions or concerns regarding consultations or procedures which the PT has had in the interim are  addressed. The PT denies any adverse reactions to current medications since the last visit.  There are no new concerns.  There are no specific complaints   ROS Denies recent fever or chills. Denies sinus pressure, nasal congestion, ear pain or sore throat. Denies chest congestion, productive cough or wheezing. Denies chest pains, palpitations and leg swelling Denies abdominal pain, nausea, vomiting,diarrhea or constipation.   Denies dysuria, frequency, hesitancy or incontinence. Denies joint pain, swelling and limitation in mobility. Denies headaches, seizures, numbness, or tingling. Denies depression, anxiety or insomnia. Denies skin break down or rash.   PE  BP 138/80 mmHg  Pulse 77  Resp 16  Ht 5\' 1"  (1.549 m)  Wt 148 lb 12.8 oz (67.495 kg)  BMI 28.13 kg/m2  SpO2 99%  Patient alert and oriented and in no cardiopulmonary distress.  HEENT: No facial asymmetry, EOMI,   oropharynx pink and moist.  Neck supple no JVD, no mass.  Chest: Clear to auscultation bilaterally.  CVS: S1, S2 no murmurs, no S3.Regular rate.  ABD: Soft non tender.   Ext: No edema  MS: Adequate ROM spine, shoulders, hips and knees.  Skin: Intact, no ulcerations or rash noted.  Psych: Good eye contact, normal affect. Memory intact not anxious or depressed appearing.  CNS: CN 2-12 intact, power,  normal throughout.no focal deficits noted.   Assessment & Plan   ALLERGIC RHINITIS,  SEASONAL Increased symptoms x 3 days , needs to resume flonase  GERD Controlled, no change in medication   Essential hypertension Controlled, no change in medication DASH diet and commitment to daily physical activity for a minimum of 30 minutes discussed and encouraged, as a part of hypertension management. The importance of attaining a healthy weight is also discussed.  BP/Weight 03/09/2015 02/01/2015 01/04/2015 12/14/2014 12/11/2014 12/08/2014 11/18/2014  Systolic BP 138 131 133 139 137 142 -  Diastolic BP 80 69 74 73 69 47 -  Wt. (Lbs) 148.8 142 142 142 142 142 144  BMI 28.13 26.84 26.84 26.84 26.84 25.97 26.33        IFG (impaired fasting glucose) Improved Patient educated about the importance of limiting  Carbohydrate intake , the need to commit to daily physical activity for a minimum of 30 minutes , and to commit weight loss. The fact that changes in all these areas will reduce or eliminate all together the development of diabetes is stressed.   Diabetic Labs Latest Ref Rng 03/06/2015 12/08/2014 10/06/2014 03/03/2014 10/08/2013  HbA1c <5.7 % 5.7(H) - 5.8(H) 5.7(H) -  Chol 125 - 200 mg/dL 213(Y228(H) - 865(H240(H) 846(N282(H) 209(H)  HDL >=46 mg/dL 57 - 51 60 49  Calc LDL <130 mg/dL 629(B153(H) - 284(X170(H) 324(M208(H) 135(H)  Triglycerides <150 mg/dL 90 - 93 72 010127  Creatinine 0.50 - 0.99 mg/dL 2.720.80 5.360.70 6.440.79 0.340.76 7.420.81   BP/Weight 03/09/2015 02/01/2015 01/04/2015 12/14/2014 12/11/2014 12/08/2014 11/18/2014  Systolic BP 138 131 133 139 137 142 -  Diastolic BP 80 69 74 73 69 47 -  Wt. (Lbs) 148.8 142 142 142 142 142 144  BMI 28.13 26.84 26.84 26.84 26.84 25.97 26.33   No flowsheet data found.     Hyperlipidemia LDL goal <100 Hyperlipidemia:Low fat diet discussed and encouraged.   Lipid Panel  Lab Results  Component Value Date   CHOL 228* 03/06/2015   HDL 57 03/06/2015   LDLCALC 153* 03/06/2015   TRIG 90 03/06/2015   CHOLHDL 4.0 03/06/2015   Improved though still uncontrolled, needs to work more on diet,  inc dose of tricor suggested but pt refused Updated lab needed at/ before next visit.         Review of Systems     Objective:   Physical Exam        Assessment & Plan:

## 2015-03-14 NOTE — Assessment & Plan Note (Addendum)
Hyperlipidemia:Low fat diet discussed and encouraged.   Lipid Panel  Lab Results  Component Value Date   CHOL 228* 03/06/2015   HDL 57 03/06/2015   LDLCALC 153* 03/06/2015   TRIG 90 03/06/2015   CHOLHDL 4.0 03/06/2015   Improved though still uncontrolled, needs to work more on diet, inc dose of tricor suggested but pt refused Updated lab needed at/ before next visit.

## 2015-04-26 DIAGNOSIS — E785 Hyperlipidemia, unspecified: Secondary | ICD-10-CM | POA: Diagnosis not present

## 2015-04-26 DIAGNOSIS — K219 Gastro-esophageal reflux disease without esophagitis: Secondary | ICD-10-CM | POA: Diagnosis not present

## 2015-04-26 DIAGNOSIS — I10 Essential (primary) hypertension: Secondary | ICD-10-CM | POA: Diagnosis not present

## 2015-04-26 DIAGNOSIS — M818 Other osteoporosis without current pathological fracture: Secondary | ICD-10-CM | POA: Diagnosis not present

## 2015-04-27 ENCOUNTER — Telehealth: Payer: Self-pay | Admitting: Family Medicine

## 2015-04-27 NOTE — Telephone Encounter (Signed)
Called and left message for patient to return call.  

## 2015-04-27 NOTE — Telephone Encounter (Signed)
Patient has a lot of questions about the medication fenofibrate (TRICOR) 145 MG tablet  Please advise?

## 2015-04-28 NOTE — Telephone Encounter (Signed)
Called and left message for patient to return call.  

## 2015-07-09 DIAGNOSIS — I10 Essential (primary) hypertension: Secondary | ICD-10-CM | POA: Diagnosis not present

## 2015-07-09 DIAGNOSIS — E785 Hyperlipidemia, unspecified: Secondary | ICD-10-CM | POA: Diagnosis not present

## 2015-07-09 DIAGNOSIS — R7301 Impaired fasting glucose: Secondary | ICD-10-CM | POA: Diagnosis not present

## 2015-07-09 LAB — HEMOGLOBIN A1C
Hgb A1c MFr Bld: 5.7 % — ABNORMAL HIGH (ref <5.7)
MEAN PLASMA GLUCOSE: 117 mg/dL — AB (ref <117)

## 2015-07-10 LAB — LIPID PANEL
CHOL/HDL RATIO: 4 ratio (ref <=5.0)
Cholesterol: 241 mg/dL — ABNORMAL HIGH (ref 125–200)
HDL: 60 mg/dL (ref >=46)
LDL Cholesterol: 168 mg/dL — ABNORMAL HIGH (ref <130)
TRIGLYCERIDES: 64 mg/dL (ref <150)
VLDL: 13 mg/dL (ref <30)

## 2015-07-10 LAB — COMPREHENSIVE METABOLIC PANEL
ALK PHOS: 75 U/L (ref 33–130)
ALT: 13 U/L (ref 6–29)
AST: 18 U/L (ref 10–35)
Albumin: 4.4 g/dL (ref 3.6–5.1)
BILIRUBIN TOTAL: 0.3 mg/dL (ref 0.2–1.2)
BUN: 15 mg/dL (ref 7–25)
CO2: 29 mmol/L (ref 20–31)
CREATININE: 0.8 mg/dL (ref 0.50–0.99)
Calcium: 9.5 mg/dL (ref 8.6–10.4)
Chloride: 106 mmol/L (ref 98–110)
Glucose, Bld: 100 mg/dL — ABNORMAL HIGH (ref 65–99)
Potassium: 4.3 mmol/L (ref 3.5–5.3)
SODIUM: 140 mmol/L (ref 135–146)
TOTAL PROTEIN: 6.9 g/dL (ref 6.1–8.1)

## 2015-07-13 ENCOUNTER — Ambulatory Visit (INDEPENDENT_AMBULATORY_CARE_PROVIDER_SITE_OTHER): Payer: Medicare HMO | Admitting: Family Medicine

## 2015-07-13 ENCOUNTER — Encounter: Payer: Self-pay | Admitting: Family Medicine

## 2015-07-13 VITALS — BP 136/80 | HR 73 | Resp 16 | Ht 61.0 in | Wt 142.8 lb

## 2015-07-13 DIAGNOSIS — R7301 Impaired fasting glucose: Secondary | ICD-10-CM

## 2015-07-13 DIAGNOSIS — E785 Hyperlipidemia, unspecified: Secondary | ICD-10-CM | POA: Diagnosis not present

## 2015-07-13 DIAGNOSIS — Z0000 Encounter for general adult medical examination without abnormal findings: Secondary | ICD-10-CM | POA: Diagnosis not present

## 2015-07-13 MED ORDER — LISINOPRIL-HYDROCHLOROTHIAZIDE 20-25 MG PO TABS: 1.0000 | ORAL_TABLET | Freq: Every day | ORAL | Status: DC

## 2015-07-13 NOTE — Patient Instructions (Addendum)
Physical exam early September, call if you need me before  Fasting lipid, cmp, hBa1C early Sept  Mammogram due mid August  Copy of lab per your request  Fall Prevention in the Home  Falls can cause injuries. They can happen to people of all ages. There are many things you can do to make your home safe and to help prevent falls.  WHAT CAN I DO ON THE OUTSIDE OF MY HOME?  Regularly fix the edges of walkways and driveways and fix any cracks.  Remove anything that might make you trip as you walk through a door, such as a raised step or threshold.  Trim any bushes or trees on the path to your home.  Use bright outdoor lighting.  Clear any walking paths of anything that might make someone trip, such as rocks or tools.  Regularly check to see if handrails are loose or broken. Make sure that both sides of any steps have handrails.  Any raised decks and porches should have guardrails on the edges.  Have any leaves, snow, or ice cleared regularly.  Use sand or salt on walking paths during winter.  Clean up any spills in your garage right away. This includes oil or grease spills. WHAT CAN I DO IN THE BATHROOM?   Use night lights.  Install grab bars by the toilet and in the tub and shower. Do not use towel bars as grab bars.  Use non-skid mats or decals in the tub or shower.  If you need to sit down in the shower, use a plastic, non-slip stool.  Keep the floor dry. Clean up any water that spills on the floor as soon as it happens.  Remove soap buildup in the tub or shower regularly.  Attach bath mats securely with double-sided non-slip rug tape.  Do not have throw rugs and other things on the floor that can make you trip. WHAT CAN I DO IN THE BEDROOM?  Use night lights.  Make sure that you have a light by your bed that is easy to reach.  Do not use any sheets or blankets that are too big for your bed. They should not hang down onto the floor.  Have a firm chair that has  side arms. You can use this for support while you get dressed.  Do not have throw rugs and other things on the floor that can make you trip. WHAT CAN I DO IN THE KITCHEN?  Clean up any spills right away.  Avoid walking on wet floors.  Keep items that you use a lot in easy-to-reach places.  If you need to reach something above you, use a strong step stool that has a grab bar.  Keep electrical cords out of the way.  Do not use floor polish or wax that makes floors slippery. If you must use wax, use non-skid floor wax.  Do not have throw rugs and other things on the floor that can make you trip. WHAT CAN I DO WITH MY STAIRS?  Do not leave any items on the stairs.  Make sure that there are handrails on both sides of the stairs and use them. Fix handrails that are broken or loose. Make sure that handrails are as long as the stairways.  Check any carpeting to make sure that it is firmly attached to the stairs. Fix any carpet that is loose or worn.  Avoid having throw rugs at the top or bottom of the stairs. If you do have throw  rugs, attach them to the floor with carpet tape.  Make sure that you have a light switch at the top of the stairs and the bottom of the stairs. If you do not have them, ask someone to add them for you. WHAT ELSE CAN I DO TO HELP PREVENT FALLS?  Wear shoes that:  Do not have high heels.  Have rubber bottoms.  Are comfortable and fit you well.  Are closed at the toe. Do not wear sandals.  If you use a stepladder:  Make sure that it is fully opened. Do not climb a closed stepladder.  Make sure that both sides of the stepladder are locked into place.  Ask someone to hold it for you, if possible.  Clearly mark and make sure that you can see:  Any grab bars or handrails.  First and last steps.  Where the edge of each step is.  Use tools that help you move around (mobility aids) if they are needed. These  include:  Canes.  Walkers.  Scooters.  Crutches.  Turn on the lights when you go into a dark area. Replace any light bulbs as soon as they burn out.  Set up your furniture so you have a clear path. Avoid moving your furniture around.  If any of your floors are uneven, fix them.  If there are any pets around you, be aware of where they are.  Review your medicines with your doctor. Some medicines can make you feel dizzy. This can increase your chance of falling. Ask your doctor what other things that you can do to help prevent falls.   This information is not intended to replace advice given to you by your health care provider. Make sure you discuss any questions you have with your health care provider.   Document Released: 02/18/2009 Document Revised: 09/08/2014 Document Reviewed: 05/29/2014 Elsevier Interactive Patient Education [employer]2016 Elsevier Inc.

## 2015-07-13 NOTE — Assessment & Plan Note (Signed)

## 2015-07-13 NOTE — Progress Notes (Signed)
Subjective:    Patient ID: Colleen [name], female    DOB: 1945-07-14, 70 y.o.   MRN: [id number]  HPI Preventive Screening-Counseling & Management   Patient present here today for a Medicare annual wellness visit.   Current Problems (verified)   Medications Prior to Visit Allergies (verified)   PAST HISTORY  Family History (verified)   Social History Married with 2 children and 2 step children. Retired from [employer], smoked for a short time in her 20's. Never used drugs    Risk Factors  Current exercise habits:  Walks and uses treadmill at home several days a week   Dietary issues discussed: heart healthy diet encouraged, eating less fried fatty foods and red meat    Cardiac risk factors:   Depression Screen  (Note: if answer to either of the following is "Yes", a more complete depression screening is indicated)   Over the past two weeks, have you felt down, depressed or hopeless? No  Over the past two weeks, have you felt little interest or pleasure in doing things? No  Have you lost interest or pleasure in daily life? No  Do you often feel hopeless? No  Do you cry easily over simple problems? No   Activities of Daily Living  In your present state of health, do you have any difficulty performing the following activities?  Driving?: No Managing money?: No Feeding yourself?:No Getting from bed to chair?:No Climbing a flight of stairs?:No Preparing food and eating?:No Bathing or showering?:No Getting dressed?:No Getting to the toilet?:No Using the toilet?:No Moving around from place to place?: No  Fall Risk Assessment In the past year have you fallen or had a near fall?:No Are you currently taking any medications that make you dizzy?:No   Hearing Difficulties: No Do you often ask people to speak up or repeat themselves?:No Do you experience ringing or noises in your ears?:No Do you have difficulty understanding soft or whispered voices?:No  Cognitive Testing    Alert? Yes Normal Appearance?Yes  Oriented to person? Yes Place? Yes  Time? Yes  Displays appropriate judgment?Yes  Can read the correct time from a watch face? yes Are you having problems remembering things?No  Advanced Directives have been discussed with the patient?Yes, brochure given , full code    List the Names of Other Physician/Practitioners you currently use:  Romeo AppleHarrison (ortho)  Indicate any recent Medical Services you may have received from other than [facility] providers in the past year (date may be approximate).   Assessment:    Annual Wellness Exam   Plan:      Medicare Attestation  I have personally reviewed:  The patient's medical and social history  Their use of alcohol, tobacco or illicit drugs  Their current medications and supplements  The patient's functional ability including ADLs,fall risks, home safety risks, cognitive, and hearing and visual impairment  Diet and physical activities  Evidence for depression or mood disorders  The patient's weight, height, BMI, and visual acuity have been recorded in the chart. I have made referrals, counseling, and provided education to the patient based on review of the above and I have provided the patient with a written personalized care plan for preventive services.      Review of Systems     Objective:   Physical Exam  BP 136/80 mmHg  Pulse 73  Resp 16  Ht 5\' 1"  (1.549 m)  Wt 142 lb 12.8 oz (64.774 kg)  BMI 27.00 kg/m2  SpO2 98%  Assessment & Plan:  Medicare annual wellness visit, subsequent Annual exam as documented. Counseling done  re healthy lifestyle involving commitment to 150 minutes exercise per week, heart healthy diet, and attaining healthy weight.The importance of adequate sleep also discussed. Regular seat belt use and home safety, is also discussed. Changes in health habits are decided on by the patient with goals and time frames  set for achieving them. Immunization and cancer  screening needs are specifically addressed at this visit.

## 2015-08-16 DIAGNOSIS — H401131 Primary open-angle glaucoma, bilateral, mild stage: Secondary | ICD-10-CM | POA: Diagnosis not present

## 2015-09-30 DIAGNOSIS — H40053 Ocular hypertension, bilateral: Secondary | ICD-10-CM | POA: Diagnosis not present

## 2015-10-28 DIAGNOSIS — H40053 Ocular hypertension, bilateral: Secondary | ICD-10-CM | POA: Diagnosis not present

## 2015-11-24 ENCOUNTER — Other Ambulatory Visit: Payer: Self-pay | Admitting: Family Medicine

## 2015-11-24 DIAGNOSIS — Z1231 Encounter for screening mammogram for malignant neoplasm of breast: Secondary | ICD-10-CM

## 2015-12-02 DIAGNOSIS — H40053 Ocular hypertension, bilateral: Secondary | ICD-10-CM | POA: Diagnosis not present

## 2015-12-23 ENCOUNTER — Ambulatory Visit (HOSPITAL_COMMUNITY)
Admission: RE | Admit: 2015-12-23 | Discharge: 2015-12-23 | Disposition: A | Discharge status: Home / Self Care | Payer: Medicare HMO | Source: Ambulatory Visit | Attending: Family Medicine | Admitting: Family Medicine

## 2015-12-23 ENCOUNTER — Telehealth: Payer: Self-pay | Admitting: Family Medicine

## 2015-12-23 ENCOUNTER — Other Ambulatory Visit: Payer: Self-pay

## 2015-12-23 DIAGNOSIS — E785 Hyperlipidemia, unspecified: Secondary | ICD-10-CM

## 2015-12-23 DIAGNOSIS — Z1231 Encounter for screening mammogram for malignant neoplasm of breast: Secondary | ICD-10-CM

## 2015-12-23 MED ORDER — FENOFIBRATE 145 MG PO TABS: 145.0000 mg | ORAL_TABLET | Freq: Every day | ORAL | 5 refills | Status: DC

## 2015-12-23 MED ORDER — LISINOPRIL-HYDROCHLOROTHIAZIDE 20-25 MG PO TABS: 1.0000 | ORAL_TABLET | Freq: Every day | ORAL | 1 refills | Status: DC

## 2015-12-23 MED ORDER — OMEPRAZOLE 20 MG PO CPDR: 20.0000 mg | DELAYED_RELEASE_CAPSULE | Freq: Every day | ORAL | 1 refills | Status: DC

## 2015-12-23 MED ORDER — ALENDRONATE SODIUM 70 MG PO TABS: ORAL_TABLET | ORAL | 2 refills | Status: DC

## 2015-12-23 NOTE — Telephone Encounter (Signed)
[facility]

## 2015-12-23 NOTE — Telephone Encounter (Signed)
Colleen [name] is [facility] and needs a Rx of fenofibrate (TRICOR) 145 MG tablet and Omeprazole DR 20 mg please advise?

## 2016-01-04 DIAGNOSIS — E785 Hyperlipidemia, unspecified: Secondary | ICD-10-CM | POA: Diagnosis not present

## 2016-01-04 DIAGNOSIS — R7301 Impaired fasting glucose: Secondary | ICD-10-CM | POA: Diagnosis not present

## 2016-01-04 LAB — HEMOGLOBIN A1C
HEMOGLOBIN A1C: 5.4 % (ref <5.7)
MEAN PLASMA GLUCOSE: 108 mg/dL

## 2016-01-05 LAB — COMPREHENSIVE METABOLIC PANEL
ALBUMIN: 4.3 g/dL (ref 3.6–5.1)
ALK PHOS: 84 U/L (ref 33–130)
ALT: 15 U/L (ref 6–29)
AST: 19 U/L (ref 10–35)
BUN: 15 mg/dL (ref 7–25)
CALCIUM: 9.5 mg/dL (ref 8.6–10.4)
CHLORIDE: 101 mmol/L (ref 98–110)
CO2: 28 mmol/L (ref 20–31)
Creat: 0.93 mg/dL (ref 0.50–0.99)
Glucose, Bld: 102 mg/dL — ABNORMAL HIGH (ref 65–99)
POTASSIUM: 4 mmol/L (ref 3.5–5.3)
Sodium: 138 mmol/L (ref 135–146)
TOTAL PROTEIN: 6.9 g/dL (ref 6.1–8.1)
Total Bilirubin: 0.5 mg/dL (ref 0.2–1.2)

## 2016-01-05 LAB — LIPID PANEL
Cholesterol: 230 mg/dL — ABNORMAL HIGH (ref 125–200)
HDL: 50 mg/dL
LDL Cholesterol: 154 mg/dL — ABNORMAL HIGH
Total CHOL/HDL Ratio: 4.6 ratio
Triglycerides: 130 mg/dL
VLDL: 26 mg/dL

## 2016-01-07 ENCOUNTER — Other Ambulatory Visit (HOSPITAL_COMMUNITY)
Admission: RE | Admit: 2016-01-07 | Discharge: 2016-01-07 | Disposition: A | Discharge status: Home / Self Care | Payer: Medicare HMO | Source: Ambulatory Visit | Attending: Family Medicine | Admitting: Family Medicine

## 2016-01-07 ENCOUNTER — Other Ambulatory Visit: Payer: Self-pay

## 2016-01-07 ENCOUNTER — Ambulatory Visit (INDEPENDENT_AMBULATORY_CARE_PROVIDER_SITE_OTHER): Payer: Medicare HMO | Admitting: Family Medicine

## 2016-01-07 ENCOUNTER — Encounter: Payer: Self-pay | Admitting: Family Medicine

## 2016-01-07 VITALS — BP 134/80 | HR 73 | Resp 16 | Ht 61.0 in | Wt 144.0 lb

## 2016-01-07 DIAGNOSIS — Z23 Encounter for immunization: Secondary | ICD-10-CM

## 2016-01-07 DIAGNOSIS — Z0000 Encounter for general adult medical examination without abnormal findings: Secondary | ICD-10-CM

## 2016-01-07 DIAGNOSIS — Z1211 Encounter for screening for malignant neoplasm of colon: Secondary | ICD-10-CM | POA: Diagnosis not present

## 2016-01-07 DIAGNOSIS — Z124 Encounter for screening for malignant neoplasm of cervix: Secondary | ICD-10-CM | POA: Insufficient documentation

## 2016-01-07 DIAGNOSIS — E785 Hyperlipidemia, unspecified: Secondary | ICD-10-CM

## 2016-01-07 DIAGNOSIS — E559 Vitamin D deficiency, unspecified: Secondary | ICD-10-CM

## 2016-01-07 DIAGNOSIS — I10 Essential (primary) hypertension: Secondary | ICD-10-CM

## 2016-01-07 DIAGNOSIS — R7301 Impaired fasting glucose: Secondary | ICD-10-CM

## 2016-01-07 LAB — POC HEMOCCULT BLD/STL (OFFICE/1-CARD/DIAGNOSTIC): FECAL OCCULT BLD: NEGATIVE

## 2016-01-07 MED ORDER — FLUTICASONE PROPIONATE 50 MCG/ACT NA SUSP: 2.0000 | Freq: Every day | NASAL | 3 refills | Status: DC

## 2016-01-07 NOTE — Patient Instructions (Addendum)
Annual wellness March 8 o or shortly after, call if you need me before  CONGRATS on improved labs , and excellent exam, keep up thje great work.  lESS cheese, and try to change to skimmed milk or almond milk, also eat beanss for protein instead of meats, chicken and fish , aim for 3 to 4 days per week  Thank you  for choosing [facility]. We consider it a privelige to serve you.  Delivering excellent health care in a caring and  compassionate way is our goal.  Partnering with you,  so that together we can achieve this goal is our strategy.    Fasting labs  In 5.5 month

## 2016-01-08 ENCOUNTER — Encounter: Payer: Self-pay | Admitting: Family Medicine

## 2016-01-08 NOTE — Assessment & Plan Note (Signed)
After obtaining informed consent, the vaccine is  administered by LPN.  

## 2016-01-08 NOTE — Assessment & Plan Note (Signed)

## 2016-01-08 NOTE — Progress Notes (Signed)
    Colleen [name]     MRN: [id number]      DOB: [birth year]  HPI: Patient is in for annual physical exam. C/o intolerance to fenofibrate and has not been taking due to myalgias Recent labs, if available are reviewed. Immunization is reviewed , and  updated if needed.   PE: Pleasant  female, alert and oriented x 3, in no cardio-pulmonary distress. Afebrile. HEENT No facial trauma or asymetry. Sinuses non tender.  Extra occullar muscles intact, pupils equally reactive to light. External ears normal, tympanic membranes clear. Oropharynx moist, no exudate. Neck: supple, no adenopathy,JVD or thyromegaly.No bruits.  Chest: Clear to ascultation bilaterally.No crackles or wheezes. Non tender to palpation  Breast: No asymetry,no masses or lumps. No tenderness. No nipple discharge or inversion. No axillary or supraclavicular adenopathy  Cardiovascular system; Heart sounds normal,  S1 and  S2 ,no S3.  No murmur, or thrill. Apical beat not displaced Peripheral pulses normal.  Abdomen: Soft, non tender, no organomegaly or masses. No bruits. Bowel sounds normal. No guarding, tenderness or rebound.  Rectal:  Normal sphincter tone. No rectal mass. Guaiac negative stool.  GU: External genitalia normal female genitalia , normal female distribution of hair. No lesions. Urethral meatus normal in size, no  Prolapse, no lesions visibly  Present. Bladder non tender. Vagina pink and moist , with no visible lesions , discharge present . Adequate pelvic support no  cystocele or rectocele noted Cervix pink and appears healthy, no lesions or ulcerations noted, no discharge noted from os Uterus normal size, no adnexal masses, no cervical motion or adnexal tenderness.   Musculoskeletal exam: Full ROM of spine, hips , shoulders and knees. No deformity ,swelling or crepitus noted. No muscle wasting or atrophy.   Neurologic: Cranial nerves 2 to 12 intact. Power, tone ,sensation and reflexes  normal throughout. No disturbance in gait. No tremor.  Skin: Intact, no ulceration, erythema , scaling or rash noted. Pigmentation normal throughout  Psych; Normal mood and affect. Judgement and concentration normal   Assessment & Plan:  Annual physical exam Annual exam as documented. Counseling done  re healthy lifestyle involving commitment to 150 minutes exercise per week, heart healthy diet, and attaining healthy weight.The importance of adequate sleep also discussed. Regular seat belt use and home safety, is also discussed. Changes in health habits are decided on by the patient with goals and time frames  set for achieving them. Immunization and cancer screening needs are specifically addressed at this visit.   Need for prophylactic vaccination and inoculation against influenza After obtaining informed consent, the vaccine is  administered by LPN.

## 2016-01-12 LAB — CYTOLOGY - PAP

## 2016-01-31 ENCOUNTER — Other Ambulatory Visit: Payer: Self-pay | Admitting: Family Medicine

## 2016-02-02 DIAGNOSIS — E7800 Pure hypercholesterolemia, unspecified: Secondary | ICD-10-CM | POA: Diagnosis not present

## 2016-02-02 DIAGNOSIS — K219 Gastro-esophageal reflux disease without esophagitis: Secondary | ICD-10-CM | POA: Diagnosis not present

## 2016-02-02 DIAGNOSIS — M818 Other osteoporosis without current pathological fracture: Secondary | ICD-10-CM | POA: Diagnosis not present

## 2016-02-02 DIAGNOSIS — Z0000 Encounter for general adult medical examination without abnormal findings: Secondary | ICD-10-CM | POA: Diagnosis not present

## 2016-02-02 DIAGNOSIS — I10 Essential (primary) hypertension: Secondary | ICD-10-CM | POA: Diagnosis not present

## 2016-02-25 ENCOUNTER — Other Ambulatory Visit: Payer: Self-pay

## 2016-02-25 MED ORDER — LISINOPRIL-HYDROCHLOROTHIAZIDE 20-25 MG PO TABS: 1.0000 | ORAL_TABLET | Freq: Every day | ORAL | 0 refills | Status: DC

## 2016-05-31 DIAGNOSIS — R69 Illness, unspecified: Secondary | ICD-10-CM | POA: Diagnosis not present

## 2016-06-08 DIAGNOSIS — 419620001 Death: Secondary | SNOMED CT | POA: Diagnosis not present

## 2016-06-08 DEATH — deceased

## 2016-07-10 DIAGNOSIS — E785 Hyperlipidemia, unspecified: Secondary | ICD-10-CM | POA: Diagnosis not present

## 2016-07-10 DIAGNOSIS — I10 Essential (primary) hypertension: Secondary | ICD-10-CM | POA: Diagnosis not present

## 2016-07-10 DIAGNOSIS — E559 Vitamin D deficiency, unspecified: Secondary | ICD-10-CM | POA: Diagnosis not present

## 2016-07-10 LAB — CBC
HEMATOCRIT: 36.7 % (ref 35.0–45.0)
HEMOGLOBIN: 12.7 g/dL (ref 11.7–15.5)
MCH: 30.6 pg (ref 27.0–33.0)
MCHC: 34.6 g/dL (ref 32.0–36.0)
MCV: 88.4 fL (ref 80.0–100.0)
MPV: 9.4 fL (ref 7.5–12.5)
Platelets: 246 10*3/uL (ref 140–400)
RBC: 4.15 MIL/uL (ref 3.80–5.10)
RDW: 13.6 % (ref 11.0–15.0)
WBC: 4 10*3/uL (ref 3.8–10.8)

## 2016-07-10 LAB — BASIC METABOLIC PANEL
BUN: 15 mg/dL (ref 7–25)
CHLORIDE: 107 mmol/L (ref 98–110)
CO2: 27 mmol/L (ref 20–31)
Calcium: 9.2 mg/dL (ref 8.6–10.4)
Creat: 0.73 mg/dL (ref 0.50–0.99)
GLUCOSE: 92 mg/dL (ref 65–99)
POTASSIUM: 4.1 mmol/L (ref 3.5–5.3)
Sodium: 140 mmol/L (ref 135–146)

## 2016-07-10 LAB — TSH: TSH: 1.26 mIU/L

## 2016-07-10 LAB — LIPID PANEL
CHOLESTEROL: 215 mg/dL — AB (ref <200)
HDL: 47 mg/dL — ABNORMAL LOW (ref >50)
LDL Cholesterol: 154 mg/dL — ABNORMAL HIGH (ref <100)
TRIGLYCERIDES: 72 mg/dL (ref <150)
Total CHOL/HDL Ratio: 4.6 Ratio (ref <5.0)
VLDL: 14 mg/dL (ref <30)

## 2016-07-11 LAB — VITAMIN D 25 HYDROXY (VIT D DEFICIENCY, FRACTURES): Vit D, 25-Hydroxy: 23 ng/mL — ABNORMAL LOW (ref 30–100)

## 2016-07-13 ENCOUNTER — Ambulatory Visit (INDEPENDENT_AMBULATORY_CARE_PROVIDER_SITE_OTHER): Payer: Medicare HMO

## 2016-07-13 ENCOUNTER — Telehealth: Payer: Self-pay | Admitting: Family Medicine

## 2016-07-13 VITALS — BP 152/84 | HR 80 | Temp 98.4°F | Ht 61.0 in | Wt 145.0 lb

## 2016-07-13 DIAGNOSIS — Z0000 Encounter for general adult medical examination without abnormal findings: Secondary | ICD-10-CM

## 2016-07-13 DIAGNOSIS — M8580 Other specified disorders of bone density and structure, unspecified site: Secondary | ICD-10-CM | POA: Diagnosis not present

## 2016-07-13 NOTE — Progress Notes (Signed)
Subjective:   Colleen [name] is a 71 y.o. female who presents for Medicare Annual (Subsequent) preventive examination.  Cardiac Risk Factors include: advanced age (>29men, >57 women);dyslipidemia;hypertension     Objective:     Vitals: BP (!) 152/84   Pulse 80   Temp 98.4 F (36.9 C) (Oral)   Ht 5\' 1"  (1.549 m)   Wt 145 lb 0.6 oz (65.8 kg)   SpO2 98%   BMI 27.41 kg/m   Body mass index is 27.41 kg/m.   Tobacco History  Smoking Status  . Never Smoker  Smokeless Tobacco  . Never Used     Counseling given: Not Answered   Past Medical History:  Diagnosis Date  . GERD (gastroesophageal reflux disease)   . Glaucoma   . Hyperlipidemia   . Hypertension    Past Surgical History:  Procedure Laterality Date  . CHOLECYSTECTOMY    . CHONDROPLASTY Left 12/11/2014   Procedure: CHONDROPLASTY LEFT MEDIAL FEMORAL CONDYLE;  Surgeon: Vickki Hearing, MD;  Location: [facility];  Service: Orthopedics;  Laterality: Left;  . KNEE ARTHROSCOPY WITH LATERAL MENISECTOMY Left 12/11/2014   Procedure: KNEE ARTHROSCOPY WITH LATERAL AND MEDIAL MENISECTOMY;  Surgeon: Vickki Hearing, MD;  Location: [facility];  Service: Orthopedics;  Laterality: Left;   Family History  Problem Relation Age of Onset  . Hypertension Mother   . Heart disease Mother 65    MI  . Hypertension Father   . Diabetes Father   . Stroke Father   . Hypertension Sister   . Diabetes Sister   . Multiple sclerosis Sister   . Hypertension Sister   . Diabetes Sister   . Heart disease Sister   . Kidney disease Sister   . Hypertension Brother   . Schizophrenia Brother 36   History  Sexual Activity  . Sexual activity: No    Outpatient Encounter Prescriptions as of 07/13/2016  Medication Sig  . alendronate (FOSAMAX) 70 MG tablet TAKE 1 TABLET BY MOUTH EVERY 7 DAYS WITH A FULL GLASS OF WATER ON AN EMPTY STOMACH  . Ascorbic Acid (VITAMIN C) 1000 MG tablet Take 1,000 mg by mouth daily.  . calcium-vitamin D (OSCAL WITH D)  500-200 MG-UNIT tablet Take 1 tablet by mouth every other day.  . cholecalciferol (VITAMIN D) 1000 units tablet Take 1,000 Units by mouth daily.  . fluticasone (FLONASE) 50 MCG/ACT nasal spray Place 2 sprays into both nostrils daily.  Marland Kitchen ibuprofen (ADVIL,MOTRIN) 200 MG tablet Take 200 mg by mouth as needed.  Marland Kitchen lisinopril-hydrochlorothiazide (PRINZIDE,ZESTORETIC) 20-25 MG tablet Take 1 tablet by mouth daily.  . Multiple Vitamin (MULTIVITAMIN) tablet Take 1 tablet by mouth daily.  Marland Kitchen omeprazole (PRILOSEC) 20 MG capsule Take 1 capsule (20 mg total) by mouth daily.  . [DISCONTINUED] cholecalciferol (VITAMIN D) 1000 units tablet Take 1,000 Units by mouth daily.   No facility-administered encounter medications on file as of 07/13/2016.     Activities of Daily Living In your present state of health, do you have any difficulty performing the following activities: 07/13/2016  Hearing? N  Vision? N  Difficulty concentrating or making decisions? N  Walking or climbing stairs? N  Dressing or bathing? N  Doing errands, shopping? N  Preparing Food and eating ? N  Using the Toilet? N  In the past six months, have you accidently leaked urine? N  Do you have problems with loss of bowel control? N  Managing your Medications? N  Managing your Finances? N  Housekeeping or  managing your Housekeeping? N  Some recent data might be hidden    Patient Care Team: Kerri PerchesMargaret E Simpson, MD as PCP - General Vickki HearingStanley E Neave Lenger, MD as Consulting Physician (Orthopedic Surgery)    Assessment:    Exercise Activities and Dietary recommendations Current Exercise Habits: Home exercise routine, Type of exercise: yoga;treadmill, Time (Minutes): 20, Frequency (Times/Week): 3, Weekly Exercise (Minutes/Week): 60, Intensity: Mild  Goals    . Exercise 3x per week (30 min per time)          Recommend increasing your exercise to 30 minutes a day for at least 3 days a week.       Fall Risk Fall Risk  07/13/2016 07/13/2015  03/16/2014 04/18/2013  Falls in the past year? No No No No   Depression Screen PHQ 2/9 Scores 07/13/2016 07/13/2015 03/16/2014 04/18/2013  PHQ - 2 Score 3 0 0 0  PHQ- 9 Score 4 0 - -     Cognitive Function   Normal 6CIT Screen 07/13/2016  What Year? 0 points  What month? 0 points  What time? 0 points  Count back from 20 0 points  Months in reverse 0 points  Repeat phrase 0 points  Total Score 0    Immunization History  Administered Date(s) Administered  . Influenza Split 03/08/2011  . Influenza Whole 01/22/2007, 02/04/2009, 01/12/2010  . Influenza,inj,Quad PF,36+ Mos 01/29/2013, 03/16/2014, 03/09/2015, 01/07/2016  . Pneumococcal Conjugate-13 11/12/2014  . Pneumococcal Polysaccharide-23 06/26/2012  . Td 10/22/2003  . Zoster 09/02/2007   Screening Tests Health Maintenance  Topic Date Due  . TETANUS/TDAP  09/08/2016 (Originally 10/21/2013)  . MAMMOGRAM  12/22/2017  . COLONOSCOPY  12/15/2019  . INFLUENZA VACCINE  Completed  . DEXA SCAN  Completed  . Hepatitis C Screening  Completed  . PNA vac Low Risk Adult  Completed      Plan:    I have personally reviewed and addressed the Medicare Annual Wellness questionnaire and have noted the following in the patient's chart:  A. Medical and social history B. Use of alcohol, tobacco or illicit drugs  C. Current medications and supplements D. Functional ability and status E.  Nutritional status F.  Physical activity G. Advance directives H. List of other physicians I.  Hospitalizations, surgeries, and ER visits in previous 12 months J.  Vitals K. Screenings to include cognitive, depression, and falls L. Referrals and appointments - [facility] referral sent today. DEXA ordered today, patient will call and schedule this.  In addition, I have reviewed and discussed with patient certain preventive protocols, quality metrics, and best practice recommendations. A written personalized care plan for preventive services as well as  general preventive health recommendations were provided to patient.  Signed,   Candis ShineKrystal Ovetta Bazzano, LPN Lead Nurse Health Advisor

## 2016-07-13 NOTE — Patient Instructions (Addendum)
Colleen [name] , Thank you for taking time to come for your Medicare Wellness Visit. I appreciate your ongoing commitment to your health goals. Please review the following plan we discussed and let me know if I can assist you in the future.   Screening recommendations: You are due for a repeat bone density scan and a tetanus vaccine. Please check with your insurance company to see if they cover a tetanus vaccine. If you would like to receive this vaccine please call our office and we will schedule a nurse visit for you. I have ordered your bone density scan, please call [facility] to schedule this test.   Abnormal screenings: None  Patient concerns: Needs assistance paying for Fosamax. I have sent a community resource referral for you today. You should receive a call from our care guide Amy within the next 2 weeks.  Nurse concerns: Weight, recommend increasing exercise to at least 30 minutes a day for at least 3 days a week. I also recommend decreasing the amount of fried and high fatty foods you are eating.  Next appt: Follow up with Dr. Lodema Hong in 6 months for your annual physical. Follow up in 1 year for your annual wellness visit.   Advance directive discussed with patient today. Copy provided for patient to complete at home and have notarized. Patient agrees to have copy sent to our office once it is complete.  Preventive Care 37 Years and Older, Female Preventive care refers to lifestyle choices and visits with your health care provider that can promote health and wellness. What does preventive care include?  A yearly physical exam. This is also called an annual well check.  Dental exams once or twice a year.  Routine eye exams. Ask your health care provider how often you should have your eyes checked.  Personal lifestyle choices, including:  Daily care of your teeth and gums.  Regular physical activity.  Eating a healthy diet.  Avoiding tobacco and drug use.  Limiting  alcohol use.  Practicing safe sex.  Taking low-dose aspirin every day.  Taking vitamin and mineral supplements as recommended by your health care provider. What happens during an annual well check? The services and screenings done by your health care provider during your annual well check will depend on your age, overall health, lifestyle risk factors, and family history of disease. Counseling  Your health care provider may ask you questions about your:  Alcohol use.  Tobacco use.  Drug use.  Emotional well-being.  Home and relationship well-being.  Sexual activity.  Eating habits.  History of falls.  Memory and ability to understand (cognition).  Work and work Astronomer.  Reproductive health. Screening  You may have the following tests or measurements:  Height, weight, and BMI.  Blood pressure.  Lipid and cholesterol levels. These may be checked every 5 years, or more frequently if you are over 44 years old.  Skin check.  Lung cancer screening. You may have this screening every year starting at age 67 if you have a 30-pack-year history of smoking and currently smoke or have quit within the past 15 years.  Fecal occult blood test (FOBT) of the stool. You may have this test every year starting at age 4.  Flexible sigmoidoscopy or colonoscopy. You may have a sigmoidoscopy every 5 years or a colonoscopy every 10 years starting at age 75.  Hepatitis C blood test.  Hepatitis B blood test.  Sexually transmitted disease (STD) testing.  Diabetes screening. This is  done by checking your blood sugar (glucose) after you have not eaten for a while (fasting). You may have this done every 1-3 years.  Bone density scan. This is done to screen for osteoporosis. You may have this done starting at age 70.  Mammogram. This may be done every 1-2 years. Talk to your health care provider about how often you should have regular mammograms. Talk with your health care provider  about your test results, treatment options, and if necessary, the need for more tests. Vaccines  Your health care provider may recommend certain vaccines, such as:  Influenza vaccine. This is recommended every year.  Tetanus, diphtheria, and acellular pertussis (Tdap, Td) vaccine. You may need a Td booster every 10 years.  Zoster vaccine. You may need this after age 70.  Pneumococcal 13-valent conjugate (PCV13) vaccine. One dose is recommended after age 70.  Pneumococcal polysaccharide (PPSV23) vaccine. One dose is recommended after age 70.  Talk to your health care provider about which screenings and vaccines you need and how often you need them. This information is not intended to replace advice given to you by your health care provider. Make sure you discuss any questions you have with your health care provider. Document Released: 05/21/2015 Document Revised: 01/12/2016 Document Reviewed: 02/23/2015 Elsevier Interactive Patient Education  2017 [employer]

## 2016-07-13 NOTE — Telephone Encounter (Signed)
Recent labs show that your cholesterol though better is still too high, please continue to reduce fried and fatty foods, cheese and butter, increase fruit and vegetable and beans  Also start OTC vit D3 1000IU once daily , your vit D level low

## 2016-07-14 NOTE — Telephone Encounter (Signed)
Sent via mychart

## 2016-07-23 ENCOUNTER — Encounter: Payer: Self-pay | Admitting: Family Medicine

## 2016-07-25 ENCOUNTER — Telehealth: Payer: Self-pay | Admitting: Family Medicine

## 2016-07-25 NOTE — Telephone Encounter (Signed)
Patient is asking if her Blood pressure medication, acid reflux meds, and sinus medication was sent into her mail order pharmacy. She does not know the name or number of the pharmacy.  Also requesting an update on bone density test.  cb#: [phone]

## 2016-07-26 DIAGNOSIS — Z7983 Long term (current) use of bisphosphonates: Secondary | ICD-10-CM | POA: Diagnosis not present

## 2016-07-26 DIAGNOSIS — E7800 Pure hypercholesterolemia, unspecified: Secondary | ICD-10-CM | POA: Diagnosis not present

## 2016-07-26 DIAGNOSIS — Z0000 Encounter for general adult medical examination without abnormal findings: Secondary | ICD-10-CM | POA: Diagnosis not present

## 2016-07-26 DIAGNOSIS — Z79899 Other long term (current) drug therapy: Secondary | ICD-10-CM | POA: Diagnosis not present

## 2016-07-26 DIAGNOSIS — K08409 Partial loss of teeth, unspecified cause, unspecified class: Secondary | ICD-10-CM | POA: Diagnosis not present

## 2016-07-26 DIAGNOSIS — K219 Gastro-esophageal reflux disease without esophagitis: Secondary | ICD-10-CM | POA: Diagnosis not present

## 2016-07-26 DIAGNOSIS — I10 Essential (primary) hypertension: Secondary | ICD-10-CM | POA: Diagnosis not present

## 2016-07-26 DIAGNOSIS — M810 Age-related osteoporosis without current pathological fracture: Secondary | ICD-10-CM | POA: Diagnosis not present

## 2016-07-26 DIAGNOSIS — H40009 Preglaucoma, unspecified, unspecified eye: Secondary | ICD-10-CM | POA: Diagnosis not present

## 2016-07-26 DIAGNOSIS — Z6827 Body mass index (BMI) 27.0-27.9, adult: Secondary | ICD-10-CM | POA: Diagnosis not present

## 2016-07-26 DIAGNOSIS — M130 Polyarthritis, unspecified: Secondary | ICD-10-CM | POA: Diagnosis not present

## 2016-07-26 DIAGNOSIS — J309 Allergic rhinitis, unspecified: Secondary | ICD-10-CM | POA: Diagnosis not present

## 2016-07-27 ENCOUNTER — Telehealth: Payer: Self-pay | Admitting: Family Medicine

## 2016-07-27 ENCOUNTER — Other Ambulatory Visit: Payer: Self-pay

## 2016-07-27 MED ORDER — OMEPRAZOLE 20 MG PO CPDR: 20.0000 mg | DELAYED_RELEASE_CAPSULE | Freq: Every day | ORAL | 1 refills | Status: DC

## 2016-07-27 MED ORDER — LISINOPRIL-HYDROCHLOROTHIAZIDE 20-25 MG PO TABS: 1.0000 | ORAL_TABLET | Freq: Every day | ORAL | 1 refills | Status: DC

## 2016-07-27 MED ORDER — FLUTICASONE PROPIONATE 50 MCG/ACT NA SUSP: 2.0000 | Freq: Every day | NASAL | 1 refills | Status: DC

## 2016-07-27 NOTE — Telephone Encounter (Signed)
Mailed results  

## 2016-07-27 NOTE — Telephone Encounter (Signed)
ls mail or call pt with her result msg which she has not read, thanks

## 2016-08-08 ENCOUNTER — Other Ambulatory Visit: Payer: Self-pay | Admitting: Family Medicine

## 2016-09-06 DIAGNOSIS — H5203 Hypermetropia, bilateral: Secondary | ICD-10-CM | POA: Diagnosis not present

## 2016-10-05 DIAGNOSIS — R69 Illness, unspecified: Secondary | ICD-10-CM | POA: Diagnosis not present

## 2016-10-18 ENCOUNTER — Other Ambulatory Visit: Payer: Self-pay | Admitting: Family Medicine

## 2016-10-18 DIAGNOSIS — Z780 Asymptomatic menopausal state: Secondary | ICD-10-CM

## 2016-11-13 ENCOUNTER — Telehealth: Payer: Self-pay | Admitting: Family Medicine

## 2016-11-13 NOTE — Telephone Encounter (Signed)
Patient would like to know the status of the bone density test being ordered.  She just spoke with her insurance company and they told her that she would not have to pay copay .  She states that it has been a long time since she had one.

## 2016-11-15 ENCOUNTER — Emergency Department (HOSPITAL_COMMUNITY): Payer: Medicare HMO

## 2016-11-15 ENCOUNTER — Emergency Department (HOSPITAL_COMMUNITY)
Admission: EM | Admit: 2016-11-15 | Discharge: 2016-11-15 | Disposition: A | Discharge status: Home / Self Care | Payer: Medicare HMO | Attending: Emergency Medicine | Admitting: Emergency Medicine

## 2016-11-15 ENCOUNTER — Encounter (HOSPITAL_COMMUNITY): Payer: Self-pay | Admitting: Emergency Medicine

## 2016-11-15 DIAGNOSIS — Y92007 Garden or yard of unspecified non-institutional (private) residence as the place of occurrence of the external cause: Secondary | ICD-10-CM | POA: Diagnosis not present

## 2016-11-15 DIAGNOSIS — Y93H2 Activity, gardening and landscaping: Secondary | ICD-10-CM | POA: Insufficient documentation

## 2016-11-15 DIAGNOSIS — S52552A Other extraarticular fracture of lower end of left radius, initial encounter for closed fracture: Secondary | ICD-10-CM | POA: Insufficient documentation

## 2016-11-15 DIAGNOSIS — S6992XA Unspecified injury of left wrist, hand and finger(s), initial encounter: Secondary | ICD-10-CM | POA: Diagnosis present

## 2016-11-15 DIAGNOSIS — I10 Essential (primary) hypertension: Secondary | ICD-10-CM | POA: Diagnosis not present

## 2016-11-15 DIAGNOSIS — Y999 Unspecified external cause status: Secondary | ICD-10-CM | POA: Insufficient documentation

## 2016-11-15 DIAGNOSIS — W010XXA Fall on same level from slipping, tripping and stumbling without subsequent striking against object, initial encounter: Secondary | ICD-10-CM | POA: Diagnosis not present

## 2016-11-15 DIAGNOSIS — Z791 Long term (current) use of non-steroidal anti-inflammatories (NSAID): Secondary | ICD-10-CM | POA: Insufficient documentation

## 2016-11-15 DIAGNOSIS — Z79899 Other long term (current) drug therapy: Secondary | ICD-10-CM | POA: Insufficient documentation

## 2016-11-15 DIAGNOSIS — S52502A Unspecified fracture of the lower end of left radius, initial encounter for closed fracture: Secondary | ICD-10-CM | POA: Diagnosis not present

## 2016-11-15 NOTE — ED Triage Notes (Signed)
Patient states she slipped when working in garden yesterday morning and landed on left wrist. [name] complains of left wrist pain and swelling since yesterday. NAD.

## 2016-11-16 NOTE — ED Provider Notes (Signed)
[facility] Provider Note   CSN: [id number] Arrival date & time: 11/15/16  1535     History   Chief Complaint Chief Complaint  Patient presents with  . Wrist Pain    HPI Colleen [name] is a 71 y.o. female.  The history is provided by the patient and a relative.  Wrist Pain  This is a new problem. The current episode started yesterday. The problem occurs constantly. The problem has not changed since onset.The symptoms are aggravated by bending, twisting and stress. The symptoms are relieved by rest. She has tried a cold compress (motrin) for the symptoms. The treatment provided no relief.   Pt fell on her outstretched left hand when she tripped working in her garden yesterday.  She denies any other injuries with this fall including no head injury.  Past Medical History:  Diagnosis Date  . GERD (gastroesophageal reflux disease)   . Glaucoma   . Hyperlipidemia   . Hypertension     Patient Active Problem List   Diagnosis Date Noted  . Primary osteoarthritis of left knee   . Annual physical exam 11/15/2014  . Need for prophylactic vaccination and inoculation against influenza 03/16/2014  . IFG (impaired fasting glucose) 10/13/2013  . ALLERGIC RHINITIS, SEASONAL 03/30/2008  . Hyperlipidemia LDL goal <100 09/17/2007  . Essential hypertension 09/17/2007  . GERD 09/17/2007  . Osteopenia 09/17/2007    Past Surgical History:  Procedure Laterality Date  . CHOLECYSTECTOMY    . CHONDROPLASTY Left 12/11/2014   Procedure: CHONDROPLASTY LEFT MEDIAL FEMORAL CONDYLE;  Surgeon: Vickki Hearing, MD;  Location: [facility];  Service: Orthopedics;  Laterality: Left;  . KNEE ARTHROSCOPY WITH LATERAL MENISECTOMY Left 12/11/2014   Procedure: KNEE ARTHROSCOPY WITH LATERAL AND MEDIAL MENISECTOMY;  Surgeon: Vickki Hearing, MD;  Location: [facility];  Service: Orthopedics;  Laterality: Left;    OB History    No data available       Home Medications    Prior to Admission medications     Medication Sig Start Date End Date Taking? Authorizing Provider  alendronate (FOSAMAX) 70 MG tablet TAKE 1 TABLET BY MOUTH EVERY 7 DAYS WITH A FULL GLASS OF WATER ON AN EMPTY STOMACH 12/23/15   Kerri Perches, MD  Ascorbic Acid (VITAMIN C) 1000 MG tablet Take 1,000 mg by mouth daily.    [provider]  calcium-vitamin D (OSCAL WITH D) 500-200 MG-UNIT tablet Take 1 tablet by mouth every other day.    [provider]  cholecalciferol (VITAMIN D) 1000 units tablet Take 1,000 Units by mouth daily.    [provider]  fluticasone (FLONASE) 50 MCG/ACT nasal spray Place 2 sprays into both nostrils daily. 07/27/16   Kerri Perches, MD  ibuprofen (ADVIL,MOTRIN) 200 MG tablet Take 200 mg by mouth as needed.    [provider]  lisinopril-hydrochlorothiazide (PRINZIDE,ZESTORETIC) 20-25 MG tablet Take 1 tablet by mouth daily. 07/27/16   Kerri Perches, MD  Multiple Vitamin (MULTIVITAMIN) tablet Take 1 tablet by mouth daily.    [provider]  omeprazole (PRILOSEC) 20 MG capsule Take 1 capsule (20 mg total) by mouth daily. 07/27/16   Kerri Perches, MD  omeprazole (PRILOSEC) 20 MG capsule TAKE 1 CAPSULE(20 MG) BY MOUTH DAILY 08/08/16   Kerri Perches, MD    Family History Family History  Problem Relation Age of Onset  . Hypertension Mother   . Heart disease Mother 91       MI  . Hypertension  Father   . Diabetes Father   . Stroke Father   . Hypertension Sister   . Diabetes Sister   . Multiple sclerosis Sister   . Hypertension Sister   . Diabetes Sister   . Heart disease Sister   . Kidney disease Sister   . Hypertension Brother   . Schizophrenia Brother [id number]    Social History Social History  Substance Use Topics  . Smoking status: Never Smoker  . Smokeless tobacco: Never Used  . Alcohol use No     Allergies   Aspirin; Fenofibrate; and Statins   Review of Systems Review of Systems  Constitutional: Negative for fever.   Musculoskeletal: Positive for arthralgias and joint swelling. Negative for myalgias.  Neurological: Negative for weakness and numbness.     Physical Exam Updated Vital Signs BP (!) 143/55 (BP Location: Right Arm)   Pulse 67   Temp 98.3 F (36.8 C) (Oral)   Resp 16   Ht 5\' 1"  (1.549 m)   Wt 65.8 kg (145 lb)   SpO2 100%   BMI 27.40 kg/m   Physical Exam  Constitutional: She appears well-developed and well-nourished.  HENT:  Head: Atraumatic.  Neck: Normal range of motion.  Cardiovascular:  Pulses equal bilaterally  Musculoskeletal: She exhibits tenderness.       Left wrist: She exhibits decreased range of motion, bony tenderness and swelling. She exhibits no deformity.  ttp distal left wrist.  Radial pulse intact, distal sensation intact with less than 2 sec cap refill in finger tips. Left proximal forearm and elbow pain free.  Neurological: She is alert. She has normal strength. She displays normal reflexes. No sensory deficit.  Skin: Skin is warm, dry and intact.  Psychiatric: She has a normal mood and affect.     ED Treatments / Results  Labs (all labs ordered are listed, but only abnormal results are displayed) Labs Reviewed - No data to display  EKG  EKG Interpretation None       Radiology Dg Hand Complete Left  Result Date: 11/15/2016 CLINICAL DATA:  Pain following fall EXAM: LEFT HAND - COMPLETE 3+ VIEW COMPARISON:  None. FINDINGS: Frontal, oblique, and lateral views were obtained. There is a transversely oriented fracture of the distal radial metaphysis with alignment near anatomic. No other fracture. No dislocation. Joint spaces appear unremarkable. No erosive change. There is soft tissue swelling dorsally. IMPRESSION: Transversely oriented fracture distal radial metaphysis with alignment near anatomic. No other fracture. No dislocation. No appreciable arthropathy. Note that there is soft tissue swelling in the dorsal aspect of the hand. Electronically Signed    By: Bretta BangWilliam  Woodruff III M.D.   On: 11/15/2016 16:58    Procedures Procedures (including critical care time)  Medications Ordered in ED Medications - No data to display   Initial Impression / Assessment and Plan / ED Course  I have reviewed the triage vital signs and the nursing notes.  Pertinent labs & imaging results that were available during my care of the patient were reviewed by me and considered in my medical decision making (see chart for details).     Imaging reviewed and discussed.  Pt placed in sugar tong, sling provided. RICE, ortho f/u, referral given.  Pt was seen by Dr. Adriana Simasook during this encounter.  Final Clinical Impressions(s) / ED Diagnoses   Final diagnoses:  Other closed extra-articular fracture of distal end of left radius, initial encounter    New Prescriptions Discharge Medication List as of 11/15/2016  5:27 PM  Burgess Amor, PA-C 11/16/16 2308    Donnetta Hutching, MD 11/18/16 [phone]

## 2016-11-17 ENCOUNTER — Other Ambulatory Visit: Payer: Self-pay | Admitting: Family Medicine

## 2016-11-17 ENCOUNTER — Telehealth: Payer: Self-pay | Admitting: Family Medicine

## 2016-11-17 DIAGNOSIS — M810 Age-related osteoporosis without current pathological fracture: Secondary | ICD-10-CM

## 2016-11-17 NOTE — Telephone Encounter (Signed)
Re Ordered today!pls let her know and have her contact office re appt info/ provide during the call, thanks!

## 2016-11-17 NOTE — Telephone Encounter (Signed)
Patient called, left message on nurse line. Wanted to know if arm had anything to do with bone density. States she recently fractured arm and needed to know if she could still do test. Patient left second message stating she had called hospital and they said it was ok

## 2016-11-17 NOTE — Telephone Encounter (Signed)
appt scheduled

## 2016-11-20 ENCOUNTER — Ambulatory Visit (INDEPENDENT_AMBULATORY_CARE_PROVIDER_SITE_OTHER): Payer: Medicare HMO | Admitting: Orthopedic Surgery

## 2016-11-20 ENCOUNTER — Encounter: Payer: Self-pay | Admitting: Orthopedic Surgery

## 2016-11-20 VITALS — BP 155/79 | HR 75 | Temp 97.6°F | Ht 62.0 in | Wt 148.0 lb

## 2016-11-20 DIAGNOSIS — S52532A Colles' fracture of left radius, initial encounter for closed fracture: Secondary | ICD-10-CM

## 2016-11-20 NOTE — Progress Notes (Signed)
New problem office visit  Chief Complaint  Patient presents with  . Hand Injury    left hand fracture, DOI 11/15/16     71 years old fell down some stairs on June 11. She presented to the ER x-rays and the x-rays show nondisplaced distal radius fracture. She was placed in a splint and she's here for follow-up  Problem is new Main complaint was initial sharp but now mild constant pain left distal radius Mild swelling   Review of Systems  Constitutional: Negative for chills and fever.  Musculoskeletal: Positive for joint pain.  Neurological: Negative for tingling.   Past Medical History:  Diagnosis Date  . GERD (gastroesophageal reflux disease)   . Glaucoma   . Hyperlipidemia   . Hypertension     BP (!) 155/79   Pulse 75   Temp 97.6 F (36.4 C)   Ht 5\' 2"  (1.575 m)   Wt 148 lb (67.1 kg)   BMI 27.07 kg/m   She is oriented 3 Body habitus is normal grooming is good mood is pleasant and affect is normal Gait is normal  Left wrist is swollen and tender at the distal radius no obvious deformity is seen Range of motion is limited by pain and swelling X-ray shows stability of the wrist is normal there is no subluxation visible Muscle tone is normal in the left hand and wrist and forearm Elbow is nontender Skin is intact His good distal pulse normal, Normal sensation  Right wrist normal range of motion alignment and strength  X-ray shows a nondisplaced distal radius fracture  The report says nondisplaced distal radius fracture   Encounter Diagnosis  Name Primary?  . Closed Colles' fracture of left radius, initial encounter Yes    Brace 5 more weeks then   X-ray out of plaster

## 2016-11-20 NOTE — Patient Instructions (Addendum)
Wrist Fracture Treated With Immobilization  A wrist fracture is a break or crack in one of the bones of your wrist. Your wrist is made of eight small bones at the palm of your hand (carpal bones) and two long bones that make your forearm (radius and ulna).  A broken wrist is often treated by wearing a cast, splint, or sling (immobilization). This holds the broken pieces in place so they can heal.  Follow these instructions at home:  If you have a splint:   Wear the splint as told by your doctor. Remove it only as told by your doctor.   Loosen the splint if your fingers tingle, get numb, or turn cold and blue.   Do not let your splint get wet if it is not waterproof.   Keep the splint clean.  If you have a sling:   Wear it as told by your doctor. Remove it only as told by your doctor.  If you have a cast:   Do not stick anything inside the cast to scratch your skin.   Check the skin around the cast every day. Tell your doctor about any concerns. You may put lotion on dry skin around the edges of the cast. Do not put lotion on the skin underneath the cast.   Do not let your cast get wet if it is not waterproof.   Keep the cast clean.  Bathing   Do not take baths, swim, or use a hot tub until your doctor says that you can. Ask your doctor if you can take showers. You may only be allowed to take sponge baths.   If your cast or splint is not waterproof, cover it with a watertight plastic bag while you take a bath or a shower. Do not let the cast or splint get wet.   If you have a sling, remove it for bathing only if your doctor says this is okay.  Managing pain, stiffness, and swelling   If directed, put ice on the injured area.  ? Put ice in a plastic bag.  ? Place a towel between your skin and the bag.  ? Leave the ice on for 20 minutes, 2-3 times a day.   Move your fingers often to avoid stiffness and to lessen swelling.   Raise (elevate) the injured area above the level of your heart while you are  sitting or lying down.  Driving   Do not drive or use heavy machinery while taking prescription pain medicine.   Ask your doctor when it is safe to drive if you have a cast, splint, or sling on your wrist.  Activity   Return to your normal activities as told by your doctor. Ask your doctor what activities are safe for you.   Do range-of-motion exercises only as told by your doctor.  General instructions   Do not put pressure on any part of the cast or splint until it is fully hardened. This may take many hours.   Do not use any tobacco products, such as cigarettes, chewing tobacco, and e-cigarettes. Tobacco can delay bone healing. If you need help quitting, ask your doctor.   Take over-the-counter and prescription medicines only as told by your doctor.   Keep all follow-up visits as told by your doctor. This is important.  Contact a doctor if:   Your cast, splint, or sling is damaged or loose.   You have any new pain, swelling, or bruising.   Your pain, swelling,   and bruising do not get better.   You have a fever.   You have chills.  Get help right away if:   Your skin or fingers on your injured arm turn blue or gray.   Your arm feels cold or gets numb.   You have very bad pain in your injured wrist.  This information is not intended to replace advice given to you by your health care provider. Make sure you discuss any questions you have with your health care provider.  Document Released: 10/11/2007 Document Revised: 09/30/2015 Document Reviewed: 01/06/2015  Elsevier Interactive Patient Education  2018 [employer]

## 2016-11-21 ENCOUNTER — Other Ambulatory Visit: Payer: Self-pay | Admitting: Family Medicine

## 2016-11-21 DIAGNOSIS — Z1231 Encounter for screening mammogram for malignant neoplasm of breast: Secondary | ICD-10-CM

## 2016-11-23 ENCOUNTER — Ambulatory Visit (HOSPITAL_COMMUNITY)
Admission: RE | Admit: 2016-11-23 | Discharge: 2016-11-23 | Disposition: A | Discharge status: Home / Self Care | Payer: Medicare HMO | Source: Ambulatory Visit | Attending: Family Medicine | Admitting: Family Medicine

## 2016-11-23 DIAGNOSIS — Z780 Asymptomatic menopausal state: Secondary | ICD-10-CM | POA: Diagnosis not present

## 2016-11-23 DIAGNOSIS — M810 Age-related osteoporosis without current pathological fracture: Secondary | ICD-10-CM | POA: Insufficient documentation

## 2016-11-23 DIAGNOSIS — M85851 Other specified disorders of bone density and structure, right thigh: Secondary | ICD-10-CM | POA: Insufficient documentation

## 2016-11-26 ENCOUNTER — Encounter: Payer: Self-pay | Admitting: Family Medicine

## 2016-12-25 ENCOUNTER — Ambulatory Visit (HOSPITAL_COMMUNITY)
Admission: RE | Admit: 2016-12-25 | Discharge: 2016-12-25 | Disposition: A | Discharge status: Home / Self Care | Payer: Medicare HMO | Source: Ambulatory Visit | Attending: Family Medicine | Admitting: Family Medicine

## 2016-12-25 ENCOUNTER — Ambulatory Visit (HOSPITAL_COMMUNITY): Payer: Medicare HMO

## 2016-12-25 DIAGNOSIS — Z1231 Encounter for screening mammogram for malignant neoplasm of breast: Secondary | ICD-10-CM | POA: Diagnosis not present

## 2017-01-01 ENCOUNTER — Ambulatory Visit (INDEPENDENT_AMBULATORY_CARE_PROVIDER_SITE_OTHER): Payer: Medicare HMO

## 2017-01-01 ENCOUNTER — Ambulatory Visit (INDEPENDENT_AMBULATORY_CARE_PROVIDER_SITE_OTHER): Payer: Medicare HMO | Admitting: Orthopedic Surgery

## 2017-01-01 DIAGNOSIS — S52532D Colles' fracture of left radius, subsequent encounter for closed fracture with routine healing: Secondary | ICD-10-CM

## 2017-01-01 NOTE — Progress Notes (Signed)
Fracture care follow-up  Left Colles' fracture. X-ray out of plaster. Injury date 11/15/2016  Post injury day number  47 Fracture treated with  brace and splinting  X-rays today show left wrist fracture appears healed with normal [facility] x-ray in neutral volar tilt   Current Outpatient Prescriptions:  .  alendronate (FOSAMAX) 70 MG tablet, TAKE 1 TABLET BY MOUTH EVERY 7 DAYS WITH A FULL GLASS OF WATER ON AN EMPTY STOMACH, Disp: 4 tablet, Rfl: 2 .  Ascorbic Acid (VITAMIN C) 1000 MG tablet, Take 1,000 mg by mouth daily., Disp: , Rfl:  .  calcium-vitamin D (OSCAL WITH D) 500-200 MG-UNIT tablet, Take 1 tablet by mouth every other day., Disp: , Rfl:  .  cholecalciferol (VITAMIN D) 1000 units tablet, Take 1,000 Units by mouth daily., Disp: , Rfl:  .  fluticasone (FLONASE) 50 MCG/ACT nasal spray, Place 2 sprays into both nostrils daily., Disp: 48 g, Rfl: 1 .  ibuprofen (ADVIL,MOTRIN) 200 MG tablet, Take 200 mg by mouth as needed., Disp: , Rfl:  .  lisinopril-hydrochlorothiazide (PRINZIDE,ZESTORETIC) 20-25 MG tablet, Take 1 tablet by mouth daily., Disp: 90 tablet, Rfl: 1 .  Multiple Vitamin (MULTIVITAMIN) tablet, Take 1 tablet by mouth daily., Disp: , Rfl:  .  omeprazole (PRILOSEC) 20 MG capsule, Take 1 capsule (20 mg total) by mouth daily., Disp: 90 capsule, Rfl: 1   Clinical exam  mild tenderness is noted over the first extensor compartment with normal extensor tendon function. No clinical deformity is seen.  X-ray shows fracture healing. Alignment is normal.   Encounter Diagnosis  Name Primary?  . Closed Colles' fracture of left radius with routine healing, subsequent encounter Yes    Plan  Brace taper as instructed   Return as needed  Take ibuprofen 600 mg 3 times a day as needed

## 2017-01-01 NOTE — Patient Instructions (Signed)
Start with 8 hours a day bracing for one week and then the second week 4 hours a day and then the third week remove the brace for good  Take ibuprofen 600 mg 3 times a day as needed

## 2017-01-09 DIAGNOSIS — R69 Illness, unspecified: Secondary | ICD-10-CM | POA: Diagnosis not present

## 2017-01-15 ENCOUNTER — Encounter: Payer: Self-pay | Admitting: Family Medicine

## 2017-01-15 ENCOUNTER — Ambulatory Visit (INDEPENDENT_AMBULATORY_CARE_PROVIDER_SITE_OTHER): Payer: Medicare HMO | Admitting: Family Medicine

## 2017-01-15 VITALS — BP 120/70 | HR 72 | Temp 98.6°F | Resp 16 | Ht 62.0 in | Wt 149.8 lb

## 2017-01-15 DIAGNOSIS — E785 Hyperlipidemia, unspecified: Secondary | ICD-10-CM

## 2017-01-15 DIAGNOSIS — Z23 Encounter for immunization: Secondary | ICD-10-CM | POA: Diagnosis not present

## 2017-01-15 DIAGNOSIS — Z0000 Encounter for general adult medical examination without abnormal findings: Secondary | ICD-10-CM | POA: Diagnosis not present

## 2017-01-15 DIAGNOSIS — Z1211 Encounter for screening for malignant neoplasm of colon: Secondary | ICD-10-CM

## 2017-01-15 DIAGNOSIS — I10 Essential (primary) hypertension: Secondary | ICD-10-CM

## 2017-01-15 LAB — HEMOCCULT GUIAC POC 1CARD (OFFICE): FECAL OCCULT BLD: NEGATIVE

## 2017-01-15 MED ORDER — ALENDRONATE SODIUM 70 MG PO TABS: 70.0000 mg | ORAL_TABLET | ORAL | 3 refills | Status: DC

## 2017-01-15 NOTE — Progress Notes (Signed)
    Colleen [name]     MRN: [id number]      DOB: [birth year]  HPI: Patient is in for annual physical exam. No other health concerns are expressed or addressed at the visit.  Immunization is reviewed , and  updated    PE: BP 120/70 (BP Location: Left Arm, Patient Position: Sitting, Cuff Size: Normal)   Pulse 72   Temp 98.6 F (37 C) (Other (Comment))   Resp 16   Ht 5\' 2"  (1.575 m)   Wt 149 lb 12 oz (67.9 kg)   SpO2 98%   BMI 27.39 kg/m   Pleasant  female, alert and oriented x 3, in no cardio-pulmonary distress. Afebrile. HEENT No facial trauma or asymetry. Sinuses non tender.  Extra occullar muscles intactl. External ears normal, tympanic membranes clear. Oropharynx moist, no exudate. Neck: supple, no adenopathy,JVD or thyromegaly.No bruits.  Chest: Clear to ascultation bilaterally.No crackles or wheezes. Non tender to palpation  Breast: No asymetry,no masses or lumps. No tenderness. No nipple discharge or inversion. No axillary or supraclavicular adenopathy  Cardiovascular system; Heart sounds normal,  S1 and  S2 ,no S3.  No murmur, or thrill. Apical beat not displaced Peripheral pulses normal.  Abdomen: Soft, non tender, no organomegaly or masses. No bruits. Bowel sounds normal. No guarding, tenderness or rebound.  Rectal:  Normal sphincter tone. No rectal mass. Guaiac negative stool.  GU: Not examined, not indicated, no complaints   Musculoskeletal exam: Full ROM of spine, hips , shoulders and knees. No deformity ,swelling or crepitus noted. No muscle wasting or atrophy.   Neurologic: Cranial nerves 2 to 12 intact. Power, tone ,sensation and reflexes normal throughout. No disturbance in gait. No tremor.  Skin: Intact, no ulceration, erythema , scaling or rash noted. Pigmentation normal throughout  Psych; Normal mood and affect. Judgement and concentration normal   Assessment & Plan:  Annual physical exam Annual exam as  documented. Counseling done  re healthy lifestyle involving commitment to 150 minutes exercise per week, heart healthy diet, and attaining healthy weight.The importance of adequate sleep also discussed.  Immunization and cancer screening needs are specifically addressed at this visit.   Need for prophylactic vaccination and inoculation against influenza After obtaining informed consent, the vaccine is  administered by LPN.

## 2017-01-15 NOTE — Assessment & Plan Note (Signed)
After obtaining informed consent, the vaccine is  administered by LPN.  

## 2017-01-15 NOTE — Assessment & Plan Note (Signed)
Annual exam as documented. Counseling done  re healthy lifestyle involving commitment to 150 minutes exercise per week, heart healthy diet, and attaining healthy weight.The importance of adequate sleep also discussed.  Immunization and cancer screening needs are specifically addressed at this visit.  

## 2017-01-15 NOTE — Patient Instructions (Addendum)
Wellness visit with nurse in 5 months  MD f/u in 5 months, call if you need me sooner  Fasting lipid  And chem 7 and vitamin D 1 week before your follow up in 5 months  Work on increased fruit and vegetable in your diet and reduce fried and fatty foods   STOP omeprazole, and use oTC zantac instead for heartburn if needed  Start OTC vitamin D3 1000 IU daily as well as the caltrate twice daily for bones and one multivitamin  It is important that you exercise regularly at least 30 minutes 57times a week. If you develop chest pain, have severe difficulty breathing, or feel very tired, stop exercising immediately and seek medical attention   Flu vaccine today as discussed \ Thank you  for choosing [facility]. We consider it a privelige to serve you.  Delivering excellent health care in a caring and  compassionate way is our goal.  Partnering with you,  so that together we can achieve this goal is our strategy.

## 2017-01-25 ENCOUNTER — Other Ambulatory Visit: Payer: Self-pay | Admitting: Family Medicine

## 2017-02-05 DIAGNOSIS — 419620001 Death: Secondary | SNOMED CT | POA: Diagnosis not present

## 2017-02-05 DEATH — deceased

## 2017-02-12 ENCOUNTER — Other Ambulatory Visit: Payer: Self-pay | Admitting: Family Medicine

## 2017-06-11 DIAGNOSIS — E785 Hyperlipidemia, unspecified: Secondary | ICD-10-CM | POA: Diagnosis not present

## 2017-06-11 DIAGNOSIS — I10 Essential (primary) hypertension: Secondary | ICD-10-CM | POA: Diagnosis not present

## 2017-06-12 LAB — VITAMIN D 25 HYDROXY (VIT D DEFICIENCY, FRACTURES): VIT D 25 HYDROXY: 32 ng/mL (ref 30–100)

## 2017-06-12 LAB — COMPLETE METABOLIC PANEL WITH GFR
AG Ratio: 1.5 (calc) (ref 1.0–2.5)
ALBUMIN MSPROF: 4.4 g/dL (ref 3.6–5.1)
ALKALINE PHOSPHATASE (APISO): 79 U/L (ref 33–130)
ALT: 17 U/L (ref 6–29)
AST: 22 U/L (ref 10–35)
BUN: 18 mg/dL (ref 7–25)
CO2: 27 mmol/L (ref 20–32)
CREATININE: 0.88 mg/dL (ref 0.60–0.93)
Calcium: 9.7 mg/dL (ref 8.6–10.4)
Chloride: 104 mmol/L (ref 98–110)
GFR, EST AFRICAN AMERICAN: 77 mL/min/{1.73_m2} (ref >=60)
GFR, EST NON AFRICAN AMERICAN: 67 mL/min/{1.73_m2} (ref >=60)
Globulin: 3 g/dL (calc) (ref 1.9–3.7)
Glucose, Bld: 97 mg/dL (ref 65–99)
Potassium: 3.9 mmol/L (ref 3.5–5.3)
Sodium: 138 mmol/L (ref 135–146)
TOTAL PROTEIN: 7.4 g/dL (ref 6.1–8.1)
Total Bilirubin: 0.6 mg/dL (ref 0.2–1.2)

## 2017-06-12 LAB — LIPID PANEL
CHOLESTEROL: 244 mg/dL — AB (ref <200)
HDL: 54 mg/dL (ref >50)
LDL CHOLESTEROL (CALC): 165 mg/dL — AB
Non-HDL Cholesterol (Calc): 190 mg/dL (calc) — ABNORMAL HIGH (ref <130)
TRIGLYCERIDES: 121 mg/dL (ref <150)
Total CHOL/HDL Ratio: 4.5 (calc) (ref <5.0)

## 2017-06-18 ENCOUNTER — Ambulatory Visit (INDEPENDENT_AMBULATORY_CARE_PROVIDER_SITE_OTHER): Payer: Medicare HMO | Admitting: Family Medicine

## 2017-06-18 ENCOUNTER — Encounter: Payer: Self-pay | Admitting: Family Medicine

## 2017-06-18 VITALS — BP 140/82 | HR 73 | Resp 16 | Ht 62.0 in | Wt 147.0 lb

## 2017-06-18 DIAGNOSIS — K219 Gastro-esophageal reflux disease without esophagitis: Secondary | ICD-10-CM

## 2017-06-18 DIAGNOSIS — R7301 Impaired fasting glucose: Secondary | ICD-10-CM

## 2017-06-18 DIAGNOSIS — M8580 Other specified disorders of bone density and structure, unspecified site: Secondary | ICD-10-CM

## 2017-06-18 DIAGNOSIS — E785 Hyperlipidemia, unspecified: Secondary | ICD-10-CM | POA: Diagnosis not present

## 2017-06-18 DIAGNOSIS — I10 Essential (primary) hypertension: Secondary | ICD-10-CM | POA: Diagnosis not present

## 2017-06-18 DIAGNOSIS — J301 Allergic rhinitis due to pollen: Secondary | ICD-10-CM

## 2017-06-18 MED ORDER — PANTOPRAZOLE SODIUM 20 MG PO TBEC
20.0000 mg | DELAYED_RELEASE_TABLET | Freq: Every day | ORAL | 3 refills | Status: DC
Start: 2017-06-18 — End: 2018-02-06

## 2017-06-18 MED ORDER — CLONIDINE HCL 0.1 MG PO TABS: ORAL_TABLET | ORAL | 3 refills | Status: DC

## 2017-06-18 MED ORDER — LISINOPRIL-HYDROCHLOROTHIAZIDE 20-25 MG PO TABS: 1.0000 | ORAL_TABLET | Freq: Every day | ORAL | 1 refills | Status: DC

## 2017-06-18 NOTE — Assessment & Plan Note (Signed)
Deteriorated, , reduce fried and fatty foods Repeat in 6 month Hyperlipidemia:Low fat diet discussed and encouraged.   Lipid Panel  Lab Results  Component Value Date   CHOL 244 (H) 06/11/2017   HDL 54 06/11/2017   LDLCALC 154 (H) 07/10/2016   TRIG 121 06/11/2017   CHOLHDL 4.5 06/11/2017

## 2017-06-18 NOTE — Assessment & Plan Note (Signed)
Uncontrolled will add clonidine  0.1 mg at bedtime DASH diet and commitment to daily physical activity for a minimum of 30 minutes discussed and encouraged, as a part of hypertension management. The importance of attaining a healthy weight is also discussed.  BP/Weight 06/18/2017 01/15/2017 11/20/2016 11/15/2016 07/13/2016 01/07/2016 07/13/2015  Systolic BP 140 120 155 143 152 134 136  Diastolic BP 82 70 79 55 84 80 80  Wt. (Lbs) 147 149.75 148 145 145.04 144 142.8  BMI 26.89 27.39 27.07 27.4 27.41 27.21 27

## 2017-06-18 NOTE — Progress Notes (Signed)
   Colleen [name]     MRN: [id number]      DOB: 1946-02-17   HPI [name] is here for follow up and re-evaluation of chronic medical conditions, medication management and review of any available recent lab and radiology data.  Preventive health is updated, specifically  Cancer screening and Immunization.    The PT denies any adverse reactions to current medications since the last visit.  C/o increased and uncontrolled reflux symptoms Husband improved , so patient improved  ROS Denies recent fever or chills. Denies sinus pressure, nasal congestion, ear pain or sore throat. Denies chest congestion, productive cough or wheezing. Denies chest pains, palpitations and leg swelling Denies ,diarrhea or constipation.   Denies dysuria, frequency, hesitancy or incontinence. Denies joint pain, swelling and limitation in mobility. Denies headaches, seizures, numbness, or tingling. Denies depression, anxiety or insomnia. Denies skin break down or rash.   PE  BP 140/82   Pulse 73   Resp 16   Ht 5\' 2"  (1.575 m)   Wt 147 lb (66.7 kg)   SpO2 96%   BMI 26.89 kg/m   Patient alert and oriented and in no cardiopulmonary distress.  HEENT: No facial asymmetry, EOMI,   oropharynx pink and moist.  Neck supple no JVD, no mass.  Chest: Clear to auscultation bilaterally.  CVS: S1, S2 no murmurs, no S3.Regular rate.  ABD: Soft non tender.   Ext: No edema  MS: Adequate ROM spine, shoulders, hips and knees.  Skin: Intact, no ulcerations or rash noted.  Psych: Good eye contact, normal affect. Memory intact not anxious or depressed appearing.  CNS: CN 2-12 intact, power,  normal throughout.no focal deficits noted.   Assessment & Plan  Essential hypertension Uncontrolled will add clonidine  0.1 mg at bedtime DASH diet and commitment to daily physical activity for a minimum of 30 minutes discussed and encouraged, as a part of hypertension management. The importance of attaining a healthy  weight is also discussed.  BP/Weight 06/18/2017 01/15/2017 11/20/2016 11/15/2016 07/13/2016 01/07/2016 07/13/2015  Systolic BP 140 120 155 143 152 134 136  Diastolic BP 82 70 79 55 84 80 80  Wt. (Lbs) 147 149.75 148 145 145.04 144 142.8  BMI 26.89 27.39 27.07 27.4 27.41 27.21 27       Hyperlipidemia LDL goal <100 Deteriorated, , reduce fried and fatty foods Repeat in 6 month Hyperlipidemia:Low fat diet discussed and encouraged.   Lipid Panel  Lab Results  Component Value Date   CHOL 244 (H) 06/11/2017   HDL 54 06/11/2017   LDLCALC 154 (H) 07/10/2016   TRIG 121 06/11/2017   CHOLHDL 4.5 06/11/2017       GERD Symptomatic with cough when she lies down, resume pantoprazole  ALLERGIC RHINITIS, SEASONAL Controlled, no change in medication   Osteopenia Controlled, no change in medication

## 2017-06-18 NOTE — Patient Instructions (Addendum)
Wellness with nurse in April  F/U with MD end August  Fasting lipid, chem 7 and EGFr, CBC and TSH early August   It is important that you exercise regularly at least 30 minutes 5 times a week. If you develop chest pain, have severe difficulty breathing, or feel very tired, stop exercising immediately and seek medical attention   New additional medication for blood pressure which is high, clonidine , take 1 at bedtime, cut back salt and exercise  Start Protonix 1 daily for reflux and reduce sodas and caffeine  Please start boiling eggs, throw away yolk and stop frying foods, cholesterol is too high which increases risk of stroke and heart attack  Thank you  for choosing [facility]. We consider it a privelige to serve you.  Delivering excellent health care in a caring and  compassionate way is our goal.  Partnering with you,  so that together we can achieve this goal is our strategy.

## 2017-06-18 NOTE — Assessment & Plan Note (Addendum)
Symptomatic with cough when she lies down, resume pantoprazole

## 2017-06-19 NOTE — Assessment & Plan Note (Signed)
Controlled, no change in medication  

## 2017-07-17 ENCOUNTER — Ambulatory Visit: Payer: Medicare HMO

## 2017-08-20 ENCOUNTER — Ambulatory Visit (INDEPENDENT_AMBULATORY_CARE_PROVIDER_SITE_OTHER): Payer: Medicare HMO

## 2017-08-20 VITALS — BP 132/60 | HR 76 | Temp 98.1°F | Resp 16 | Ht 61.5 in | Wt 151.8 lb

## 2017-08-20 DIAGNOSIS — Z0000 Encounter for general adult medical examination without abnormal findings: Secondary | ICD-10-CM

## 2017-08-20 NOTE — Patient Instructions (Addendum)
[name] , Thank you for taking time to come for your Medicare Wellness Visit. I appreciate your ongoing commitment to your health goals. Please review the following plan we discussed and let me know if I can assist you in the future.   Screening recommendations/referrals: Colonoscopy: Please discuss your need for this with Dr. Lodema HongSimpson Mammogram: Due 12/2017 Bone Density: Up to date Recommended yearly ophthalmology/optometry visit for glaucoma screening and checkup Recommended yearly dental visit for hygiene and checkup  Vaccinations: Influenza vaccine: Due fall 2019 Pneumococcal vaccine: Due 2021 Tdap vaccine: Due Shingles vaccine: We recommend you update to Shingrix. Please call your insurance for coverage.    Advanced directives: Discussed today in office  Conditions/risks identified: None  Next appointment: 12/17/17   Preventive Care 65 Years and Older, Female Preventive care refers to lifestyle choices and visits with your health care provider that can promote health and wellness. What does preventive care include?  A yearly physical exam. This is also called an annual well check.  Dental exams once or twice a year.  Routine eye exams. Ask your health care provider how often you should have your eyes checked.  Personal lifestyle choices, including:  Daily care of your teeth and gums.  Regular physical activity.  Eating a healthy diet.  Avoiding tobacco and drug use.  Limiting alcohol use.  Practicing safe sex.  Taking low-dose aspirin every day.  Taking vitamin and mineral supplements as recommended by your health care provider. What happens during an annual well check? The services and screenings done by your health care provider during your annual well check will depend on your age, overall health, lifestyle risk factors, and family history of disease. Counseling  Your health care provider may ask you questions about your:  Alcohol use.  Tobacco  use.  Drug use.  Emotional well-being.  Home and relationship well-being.  Sexual activity.  Eating habits.  History of falls.  Memory and ability to understand (cognition).  Work and work Astronomerenvironment.  Reproductive health. Screening  You may have the following tests or measurements:  Height, weight, and BMI.  Blood pressure.  Lipid and cholesterol levels. These may be checked every 5 years, or more frequently if you are over 71 years old.  Skin check.  Lung cancer screening. You may have this screening every year starting at age 71 if you have a 30-pack-year history of smoking and currently smoke or have quit within the past 15 years.  Fecal occult blood test (FOBT) of the stool. You may have this test every year starting at age 71.  Flexible sigmoidoscopy or colonoscopy. You may have a sigmoidoscopy every 5 years or a colonoscopy every 10 years starting at age 71.  Hepatitis C blood test.  Hepatitis B blood test.  Sexually transmitted disease (STD) testing.  Diabetes screening. This is done by checking your blood sugar (glucose) after you have not eaten for a while (fasting). You may have this done every 1-3 years.  Bone density scan. This is done to screen for osteoporosis. You may have this done starting at age 71.  Mammogram. This may be done every 1-2 years. Talk to your health care provider about how often you should have regular mammograms. Talk with your health care provider about your test results, treatment options, and if necessary, the need for more tests. Vaccines  Your health care provider may recommend certain vaccines, such as:  Influenza vaccine. This is recommended every year.  Tetanus, diphtheria, and acellular pertussis (Tdap,  Td) vaccine. You may need a Td booster every 10 years.  Zoster vaccine. You may need this after age 28.  Pneumococcal 13-valent conjugate (PCV13) vaccine. One dose is recommended after age 53.  Pneumococcal  polysaccharide (PPSV23) vaccine. One dose is recommended after age 78. Talk to your health care provider about which screenings and vaccines you need and how often you need them. This information is not intended to replace advice given to you by your health care provider. Make sure you discuss any questions you have with your health care provider. Document Released: 05/21/2015 Document Revised: 01/12/2016 Document Reviewed: 02/23/2015 Elsevier Interactive Patient Education  2017 [place] Prevention in the Home Falls can cause injuries. They can happen to people of all ages. There are many things you can do to make your home safe and to help prevent falls. What can I do on the outside of my home?  Regularly fix the edges of walkways and driveways and fix any cracks.  Remove anything that might make you trip as you walk through a door, such as a raised step or threshold.  Trim any bushes or trees on the path to your home.  Use bright outdoor lighting.  Clear any walking paths of anything that might make someone trip, such as rocks or tools.  Regularly check to see if handrails are loose or broken. Make sure that both sides of any steps have handrails.  Any raised decks and porches should have guardrails on the edges.  Have any leaves, snow, or ice cleared regularly.  Use sand or salt on walking paths during winter.  Clean up any spills in your garage right away. This includes oil or grease spills. What can I do in the bathroom?  Use night lights.  Install grab bars by the toilet and in the tub and shower. Do not use towel bars as grab bars.  Use non-skid mats or decals in the tub or shower.  If you need to sit down in the shower, use a plastic, non-slip stool.  Keep the floor dry. Clean up any water that spills on the floor as soon as it happens.  Remove soap buildup in the tub or shower regularly.  Attach bath mats securely with double-sided non-slip rug  tape.  Do not have throw rugs and other things on the floor that can make you trip. What can I do in the bedroom?  Use night lights.  Make sure that you have a light by your bed that is easy to reach.  Do not use any sheets or blankets that are too big for your bed. They should not hang down onto the floor.  Have a firm chair that has side arms. You can use this for support while you get dressed.  Do not have throw rugs and other things on the floor that can make you trip. What can I do in the kitchen?  Clean up any spills right away.  Avoid walking on wet floors.  Keep items that you use a lot in easy-to-reach places.  If you need to reach something above you, use a strong step stool that has a grab bar.  Keep electrical cords out of the way.  Do not use floor polish or wax that makes floors slippery. If you must use wax, use non-skid floor wax.  Do not have throw rugs and other things on the floor that can make you trip. What can I do with my stairs?  Do not  leave any items on the stairs.  Make sure that there are handrails on both sides of the stairs and use them. Fix handrails that are broken or loose. Make sure that handrails are as long as the stairways.  Check any carpeting to make sure that it is firmly attached to the stairs. Fix any carpet that is loose or worn.  Avoid having throw rugs at the top or bottom of the stairs. If you do have throw rugs, attach them to the floor with carpet tape.  Make sure that you have a light switch at the top of the stairs and the bottom of the stairs. If you do not have them, ask someone to add them for you. What else can I do to help prevent falls?  Wear shoes that:  Do not have high heels.  Have rubber bottoms.  Are comfortable and fit you well.  Are closed at the toe. Do not wear sandals.  If you use a stepladder:  Make sure that it is fully opened. Do not climb a closed stepladder.  Make sure that both sides of the  stepladder are locked into place.  Ask someone to hold it for you, if possible.  Clearly mark and make sure that you can see:  Any grab bars or handrails.  First and last steps.  Where the edge of each step is.  Use tools that help you move around (mobility aids) if they are needed. These include:  Canes.  Walkers.  Scooters.  Crutches.  Turn on the lights when you go into a dark area. Replace any light bulbs as soon as they burn out.  Set up your furniture so you have a clear path. Avoid moving your furniture around.  If any of your floors are uneven, fix them.  If there are any pets around you, be aware of where they are.  Review your medicines with your doctor. Some medicines can make you feel dizzy. This can increase your chance of falling. Ask your doctor what other things that you can do to help prevent falls. This information is not intended to replace advice given to you by your health care provider. Make sure you discuss any questions you have with your health care provider. Document Released: 02/18/2009 Document Revised: 09/30/2015 Document Reviewed: 05/29/2014 Elsevier Interactive Patient Education  2017 [place] for Adults  A healthy lifestyle and preventive care can promote health and wellness. Preventive health guidelines for adults include the following key practices.  . A routine yearly physical is a good way to check with your health care provider about your health and preventive screening. It is a chance to share any concerns and updates on your health and to receive a thorough exam.  . Visit your dentist for a routine exam and preventive care every 6 months. Brush your teeth twice a day and floss once a day. Good oral hygiene prevents tooth decay and gum disease.  . The frequency of eye exams is based on your age, health, family medical history, use  of contact lenses, and other factors. Follow your health care provider's  ecommendations for frequency of eye exams.  . Eat a healthy diet. Foods like vegetables, fruits, whole grains, low-fat dairy products, and lean protein foods contain the nutrients you need without too many calories. Decrease your intake of foods high in solid fats, added sugars, and salt. Eat the right amount of calories for you. Get information about a proper diet from your health  care provider, if necessary.  . Regular physical exercise is one of the most important things you can do for your health. Most adults should get at least 150 minutes of moderate-intensity exercise (any activity that increases your heart rate and causes you to sweat) each week. In addition, most adults need muscle-strengthening exercises on 2 or more days a week.  Silver Sneakers may be a benefit available to you. To determine eligibility, you may visit the website: www.silversneakers.com or contact program at (304) 876-1138 Mon-Fri between 8AM-8PM.   . Maintain a healthy weight. The body mass index (BMI) is a screening tool to identify possible weight problems. It provides an estimate of body fat based on height and weight. Your health care provider can find your BMI and can help you achieve or maintain a healthy weight.   For adults 20 years and older: ? A BMI below 18.5 is considered underweight. ? A BMI of 18.5 to 24.9 is normal. ? A BMI of 25 to 29.9 is considered overweight. ? A BMI of 30 and above is considered obese.   . Maintain normal blood lipids and cholesterol levels by exercising and minimizing your intake of saturated fat. Eat a balanced diet with plenty of fruit and vegetables. Blood tests for lipids and cholesterol should begin at age 41 and be repeated every 5 years. If your lipid or cholesterol levels are high, you are over 50, or you are at high risk for heart disease, you may need your cholesterol levels checked more frequently. Ongoing high lipid and cholesterol levels should be treated with medicines  if diet and exercise are not working.  . If you smoke, find out from your health care provider how to quit. If you do not use tobacco, please do not start.  . If you choose to drink alcohol, please do not consume more than 2 drinks per day. One drink is considered to be 12 ounces (355 mL) of beer, 5 ounces (148 mL) of wine, or 1.5 ounces (44 mL) of liquor.  . If you are 50-11 years old, ask your health care provider if you should take aspirin to prevent strokes.  . Use sunscreen. Apply sunscreen liberally and repeatedly throughout the day. You should seek shade when your shadow is shorter than you. Protect yourself by wearing long sleeves, pants, a wide-brimmed hat, and sunglasses year round, whenever you are outdoors.  . Once a month, do a whole body skin exam, using a mirror to look at the skin on your back. Tell your health care provider of new moles, moles that have irregular borders, moles that are larger than a pencil eraser, or moles that have changed in shape or color.

## 2017-08-20 NOTE — Progress Notes (Signed)
Subjective:   Colleen [name] is a 72 y.o. female who presents for Medicare Annual (Subsequent) preventive examination.  Review of Systems:   Cardiac Risk Factors include: advanced age (>42men, >49 women);hypertension;sedentary lifestyle     Objective:     Vitals: BP 132/60 (BP Location: Left Arm, Patient Position: Sitting, Cuff Size: Normal)   Pulse 76   Temp 98.1 F (36.7 C) (Temporal)   Resp 16   Ht 5' 1.5" (1.562 m)   Wt 151 lb 12 oz (68.8 kg)   SpO2 98%   BMI 28.21 kg/m   Body mass index is 28.21 kg/m.  Advanced Directives 08/20/2017 11/15/2016 07/13/2016 01/21/2015 01/14/2015 01/12/2015 01/05/2015  Does Patient Have a Medical Advance Directive? No No No No No No No  Does patient want to make changes to medical advance directive? Yes (MAU/Ambulatory/Procedural Areas - Information given) - - - - - -  Would patient like information on creating a medical advance directive? - No - Patient declined Yes (MAU/Ambulatory/Procedural Areas - Information given) No - patient declined information No - patient declined information Yes English as a second language teacher given Yes - Transport planner given    Tobacco Social History   Tobacco Use  Smoking Status Never Smoker  Smokeless Tobacco Never Used     Counseling given: Yes   Clinical Intake:  Pre-visit preparation completed: Yes  Pain : No/denies pain Pain Score: 0-No pain     Nutritional Status: BMI 25 -29 Overweight Diabetes: No  How often do you need to have someone help you when you read instructions, pamphlets, or other written materials from your doctor or pharmacy?: 1 - Never  Interpreter Needed?: No     Past Medical History:  Diagnosis Date  . GERD (gastroesophageal reflux disease)   . Glaucoma   . Hyperlipidemia   . Hypertension    Past Surgical History:  Procedure Laterality Date  . CHOLECYSTECTOMY    . CHONDROPLASTY Left 12/11/2014   Procedure: CHONDROPLASTY LEFT MEDIAL FEMORAL CONDYLE;  Surgeon: Vickki Hearing, MD;  Location: [facility];  Service: Orthopedics;  Laterality: Left;  . KNEE ARTHROSCOPY WITH LATERAL MENISECTOMY Left 12/11/2014   Procedure: KNEE ARTHROSCOPY WITH LATERAL AND MEDIAL MENISECTOMY;  Surgeon: Vickki Hearing, MD;  Location: [facility];  Service: Orthopedics;  Laterality: Left;   Family History  Problem Relation Age of Onset  . Hypertension Mother   . Heart disease Mother 25       MI  . Hypertension Father   . Diabetes Father   . Stroke Father   . Hypertension Sister   . Diabetes Sister   . Multiple sclerosis Sister   . Hypertension Sister   . Diabetes Sister   . Heart disease Sister   . Kidney disease Sister   . Hypertension Brother   . Schizophrenia Brother 63   Social History   Socioeconomic History  . Marital status: Married    Spouse name: Not on file  . Number of children: Not on file  . Years of education: Not on file  . Highest education level: Not on file  Occupational History  . Not on file  Social Needs  . Financial resource strain: Not hard at all  . Food insecurity:    Worry: Never true    Inability: Never true  . Transportation needs:    Medical: No    Non-medical: No  Tobacco Use  . Smoking status: Never Smoker  . Smokeless tobacco: Never Used  Substance and Sexual Activity  .  Alcohol use: No  . Drug use: No  . Sexual activity: Never  Lifestyle  . Physical activity:    Days per week: 7 days    Minutes per session: 10 min  . Stress: Not at all  Relationships  . Social connections:    Talks on phone: More than three times a week    Gets together: Twice a week    Attends religious service: More than 4 times per year    Active member of club or organization: No    Attends meetings of clubs or organizations: Never    Relationship status: Married  Other Topics Concern  . Not on file  Social History Narrative  . Not on file    Outpatient Encounter Medications as of 08/20/2017  Medication Sig  . alendronate (FOSAMAX) 70 MG  tablet Take 1 tablet (70 mg total) by mouth every 7 (seven) days. Take with a full glass of water on an empty stomach.  . Ascorbic Acid (VITAMIN C) 1000 MG tablet Take 1,000 mg by mouth daily.  Marland Kitchen aspirin EC 81 MG tablet Take 81 mg by mouth daily.  . Calcium Carb-Cholecalciferol (CALTRATE 600+D) 600-800 MG-UNIT TABS One twice daily  . cloNIDine (CATAPRES) 0.1 MG tablet One tabet at bedtime  . fluticasone (FLONASE) 50 MCG/ACT nasal spray Place 2 sprays into both nostrils daily.  Marland Kitchen ibuprofen (ADVIL,MOTRIN) 200 MG tablet Take 200 mg by mouth as needed.  Marland Kitchen lisinopril-hydrochlorothiazide (PRINZIDE,ZESTORETIC) 20-25 MG tablet Take 1 tablet by mouth daily.  . Multiple Vitamin (MULTIVITAMIN) tablet Take 1 tablet by mouth daily.  . pantoprazole (PROTONIX) 20 MG tablet Take 1 tablet (20 mg total) by mouth daily.   No facility-administered encounter medications on file as of 08/20/2017.     Activities of Daily Living In your present state of health, do you have any difficulty performing the following activities: 08/20/2017  Hearing? N  Vision? N  Difficulty concentrating or making decisions? N  Walking or climbing stairs? N  Dressing or bathing? N  Doing errands, shopping? N  Preparing Food and eating ? N  Using the Toilet? N  In the past six months, have you accidently leaked urine? N  Do you have problems with loss of bowel control? N  Managing your Medications? N  Managing your Finances? N  Housekeeping or managing your Housekeeping? N  Some recent data might be hidden    Patient Care Team: Kerri Perches, MD as PCP - General Vickki Hearing, MD as Consulting Physician (Orthopedic Surgery)    Assessment:   This is a routine wellness examination for [name].  Exercise Activities and Dietary recommendations Current Exercise Habits: Home exercise routine, Type of exercise: treadmill, Time (Minutes): 10, Frequency (Times/Week): 7, Weekly Exercise (Minutes/Week): 70  Goals    .  Exercise 3x per week (30 min per time)     Recommend increasing your exercise to 30 minutes a day for at least 3 days a week.        Fall Risk Fall Risk  08/20/2017 07/13/2016 07/13/2015 03/16/2014 04/18/2013  Falls in the past year? Yes No No No No  Number falls in past yr: 1 - - - -  Injury with Fall? Yes - - - -   Is the patient's home free of loose throw rugs in walkways, pet beds, electrical cords, etc?   yes      Grab bars in the bathroom? yes      Handrails on the stairs?   yes  Adequate lighting?   yes  Depression Screen PHQ 2/9 Scores 08/20/2017 07/13/2016 07/13/2015 03/16/2014  PHQ - 2 Score 0 3 0 0  PHQ- 9 Score - 4 0 -     Cognitive Function     6CIT Screen 07/13/2016  What Year? 0 points  What month? 0 points  What time? 0 points  Count back from 20 0 points  Months in reverse 0 points  Repeat phrase 0 points  Total Score 0    Immunization History  Administered Date(s) Administered  . Influenza Split 03/08/2011  . Influenza Whole 01/22/2007, 02/04/2009, 01/12/2010  . Influenza,inj,Quad PF,6+ Mos 01/29/2013, 03/16/2014, 03/09/2015, 01/07/2016, 01/15/2017  . Pneumococcal Conjugate-13 11/12/2014  . Pneumococcal Polysaccharide-23 06/26/2012  . Td 10/22/2003  . Zoster 09/02/2007    Qualifies for Shingles Vaccine? Yes  Screening Tests Health Maintenance  Topic Date Due  . TETANUS/TDAP  02/20/2019 (Originally 10/21/2013)  . INFLUENZA VACCINE  12/06/2017  . MAMMOGRAM  12/26/2018  . COLONOSCOPY  12/15/2019  . DEXA SCAN  Completed  . Hepatitis C Screening  Completed  . PNA vac Low Risk Adult  Completed    Cancer Screenings: Lung: Low Dose CT Chest recommended if Age 40-80 years, 30 pack-year currently smoking OR have quit w/in 15years. Patient does not qualify. Breast:  Up to date on Mammogram? Yes   Up to date of Bone Density/Dexa? Yes Colorectal: She will need to discuss the need for this with Dr. Lodema HongSimpson at her next visit.       Plan:      I have  personally reviewed and noted the following in the patient's chart:   . Medical and social history . Use of alcohol, tobacco or illicit drugs  . Current medications and supplements . Functional ability and status . Nutritional status . Physical activity . Advanced directives . List of other physicians . Hospitalizations, surgeries, and ER visits in previous 12 months . Vitals . Screenings to include cognitive, depression, and falls . Referrals and appointments  In addition, I have reviewed and discussed with patient certain preventive protocols, quality metrics, and best practice recommendations. A written personalized care plan for preventive services as well as general preventive health recommendations were provided to patient.     Mack HookBreanna  Sabrena Gavitt, LPN  [id number]/15/2019

## 2017-10-22 ENCOUNTER — Other Ambulatory Visit: Payer: Self-pay | Admitting: Family Medicine

## 2017-11-23 ENCOUNTER — Other Ambulatory Visit: Payer: Self-pay | Admitting: Family Medicine

## 2017-11-23 DIAGNOSIS — Z1231 Encounter for screening mammogram for malignant neoplasm of breast: Secondary | ICD-10-CM

## 2017-12-10 DIAGNOSIS — E785 Hyperlipidemia, unspecified: Secondary | ICD-10-CM | POA: Diagnosis not present

## 2017-12-10 DIAGNOSIS — I10 Essential (primary) hypertension: Secondary | ICD-10-CM | POA: Diagnosis not present

## 2017-12-10 LAB — CBC
HCT: 36.2 % (ref 35.0–45.0)
Hemoglobin: 12.5 g/dL (ref 11.7–15.5)
MCH: 29.8 pg (ref 27.0–33.0)
MCHC: 34.5 g/dL (ref 32.0–36.0)
MCV: 86.2 fL (ref 80.0–100.0)
MPV: 9.9 fL (ref 7.5–12.5)
Platelets: 247 10*3/uL (ref 140–400)
RBC: 4.2 10*6/uL (ref 3.80–5.10)
RDW: 12.8 % (ref 11.0–15.0)
WBC: 4.5 10*3/uL (ref 3.8–10.8)

## 2017-12-10 LAB — LIPID PANEL
CHOLESTEROL: 244 mg/dL — AB (ref <200)
HDL: 52 mg/dL (ref >50)
LDL Cholesterol (Calc): 174 mg/dL (calc) — ABNORMAL HIGH
Non-HDL Cholesterol (Calc): 192 mg/dL (calc) — ABNORMAL HIGH (ref <130)
TRIGLYCERIDES: 75 mg/dL (ref <150)
Total CHOL/HDL Ratio: 4.7 (calc) (ref <5.0)

## 2017-12-10 LAB — BASIC METABOLIC PANEL WITH GFR
BUN: 19 mg/dL (ref 7–25)
CO2: 26 mmol/L (ref 20–32)
Calcium: 9.5 mg/dL (ref 8.6–10.4)
Chloride: 107 mmol/L (ref 98–110)
Creat: 0.93 mg/dL (ref 0.60–0.93)
GFR, Est African American: 72 mL/min/{1.73_m2} (ref >=60)
GFR, Est Non African American: 62 mL/min/{1.73_m2} (ref >=60)
GLUCOSE: 99 mg/dL (ref 65–99)
Potassium: 4.1 mmol/L (ref 3.5–5.3)
Sodium: 141 mmol/L (ref 135–146)

## 2017-12-10 LAB — TSH: TSH: 1.83 mIU/L (ref 0.40–4.50)

## 2017-12-17 ENCOUNTER — Other Ambulatory Visit: Payer: Self-pay

## 2017-12-17 ENCOUNTER — Ambulatory Visit (INDEPENDENT_AMBULATORY_CARE_PROVIDER_SITE_OTHER): Payer: Medicare HMO | Admitting: Family Medicine

## 2017-12-17 ENCOUNTER — Encounter: Payer: Self-pay | Admitting: Family Medicine

## 2017-12-17 VITALS — BP 148/66 | HR 72 | Resp 12 | Ht 62.0 in | Wt 150.1 lb

## 2017-12-17 DIAGNOSIS — M25562 Pain in left knee: Secondary | ICD-10-CM

## 2017-12-17 DIAGNOSIS — I10 Essential (primary) hypertension: Secondary | ICD-10-CM

## 2017-12-17 DIAGNOSIS — E785 Hyperlipidemia, unspecified: Secondary | ICD-10-CM | POA: Diagnosis not present

## 2017-12-17 DIAGNOSIS — M1712 Unilateral primary osteoarthritis, left knee: Secondary | ICD-10-CM

## 2017-12-17 DIAGNOSIS — M25551 Pain in right hip: Secondary | ICD-10-CM

## 2017-12-17 DIAGNOSIS — M5430 Sciatica, unspecified side: Secondary | ICD-10-CM

## 2017-12-17 MED ORDER — PREDNISONE 5 MG (21) PO TBPK: ORAL_TABLET | ORAL | 0 refills | Status: DC

## 2017-12-17 MED ORDER — KETOROLAC TROMETHAMINE 60 MG/2ML IM SOLN
60.0000 mg | Freq: Once | INTRAMUSCULAR | Status: AC
  Administered 2017-12-17: 60 mg via INTRAMUSCULAR

## 2017-12-17 MED ORDER — METHYLPREDNISOLONE ACETATE 80 MG/ML IJ SUSP
80.0000 mg | Freq: Once | INTRAMUSCULAR | Status: AC
  Administered 2017-12-17: 80 mg via INTRAMUSCULAR

## 2017-12-17 MED ORDER — IBUPROFEN 800 MG PO TABS: 800.0000 mg | ORAL_TABLET | Freq: Three times a day (TID) | ORAL | 0 refills | Status: DC | PRN

## 2017-12-20 ENCOUNTER — Other Ambulatory Visit: Payer: Self-pay

## 2017-12-20 MED ORDER — IBUPROFEN 800 MG PO TABS: 800.0000 mg | ORAL_TABLET | Freq: Three times a day (TID) | ORAL | 0 refills | Status: DC | PRN

## 2017-12-20 MED ORDER — PREDNISONE 5 MG (21) PO TBPK: ORAL_TABLET | ORAL | 0 refills | Status: DC

## 2017-12-21 ENCOUNTER — Encounter: Payer: Self-pay | Admitting: Family Medicine

## 2017-12-21 DIAGNOSIS — M25551 Pain in right hip: Secondary | ICD-10-CM | POA: Insufficient documentation

## 2017-12-21 NOTE — Assessment & Plan Note (Signed)
Hyperlipidemia:Low fat diet discussed and encouraged.   Lipid Panel  Lab Results  Component Value Date   CHOL 244 (H) 12/10/2017   HDL 52 12/10/2017   LDLCALC 174 (H) 12/10/2017   TRIG 75 12/10/2017   CHOLHDL 4.7 12/10/2017   Uncontrolled, needs to commit to dietary change and also to taking prescribed meds

## 2017-12-21 NOTE — Assessment & Plan Note (Addendum)
Uncontrolled at visit, pt reports due to pain and has not taken medication on schedule Will re evaluate, however advised to increase clonidine to 2 tabs at bedtime , total dose of 0.2 mg  DASH diet and commitment to daily physical activity for a minimum of 30 minutes discussed and encouraged, as a part of hypertension management. The importance of attaining a healthy weight is also discussed.  BP/Weight 12/17/2017 08/20/2017 06/18/2017 01/15/2017 11/20/2016 11/15/2016 07/13/2016  Systolic BP 148 132 140 120 155 143 152  Diastolic BP 66 60 82 70 79 55 84  Wt. (Lbs) 150.08 151.75 147 149.75 148 145 145.04  BMI 27.45 28.21 26.89 27.39 27.07 27.4 27.41

## 2017-12-21 NOTE — Assessment & Plan Note (Signed)
Uncontrolled.Toradol and depo medrol administered IM in the office , to be followed by a short course of oral prednisone and NSAIDS.  

## 2017-12-21 NOTE — Progress Notes (Signed)
   Colleen [name]     MRN: [id number]      DOB: Feb 03, 1946   HPI [name] is here for follow up and re-evaluation of chronic medical conditions, medication management and review of any available recent lab and radiology data.  Preventive health is updated, specifically  Cancer screening and Immunization.   Questions or concerns regarding consultations or procedures which the PT has had in the interim are  addressed. The PT denies any adverse reactions to current medications since the last visit.  C/o right hip  and left knee pain falre in the past 2 weeks, requests treatment for this, no recent inciting trauma ROS; Denies recent fever or chills. Denies sinus pressure, nasal congestion, ear pain or sore throat. Denies chest congestion, productive cough or wheezing. Denies chest pains, palpitations and leg swelling Denies abdominal pain, nausea, vomiting,diarrhea or constipation.   Denies dysuria, frequency, hesitancy or incontinence.  Denies headaches, seizures, numbness, or tingling. Denies depression, anxiety or insomnia. Denies skin break down or rash.   PE  BP (!) 148/66 (BP Location: Left Arm, Patient Position: Sitting, Cuff Size: Large)   Pulse 72   Resp 12   Ht 5\' 2"  (1.575 m)   Wt 150 lb 1.3 oz (68.1 kg)   SpO2 97% Comment: room air  BMI 27.45 kg/m   Patient alert and oriented and in no cardiopulmonary distress.  HEENT: No facial asymmetry, EOMI,   oropharynx pink and moist.  Neck supple no JVD, no mass.  Chest: Clear to auscultation bilaterally.  CVS: S1, S2 no murmurs, no S3.Regular rate.  ABD: Soft non tender.   Ext: No edema  MS: Adequate ROM spine, shoulders, decreased in right  hip and left knee.  Skin: Intact, no ulcerations or rash noted.  Psych: Good eye contact, normal affect. Memory intact not anxious or depressed appearing.  CNS: CN 2-12 intact, power,  normal throughout.no focal deficits noted.   Assessment & Plan  Essential  hypertension Uncontrolled at visit, pt reports dued top pain and has not taken medication on schedule Will re evaluate DASH diet and commitment to daily physical activity for a minimum of 30 minutes discussed and encouraged, as a part of hypertension management. The importance of attaining a healthy weight is also discussed.  BP/Weight 12/17/2017 08/20/2017 06/18/2017 01/15/2017 11/20/2016 11/15/2016 07/13/2016  Systolic BP 148 132 140 120 155 143 152  Diastolic BP 66 60 82 70 79 55 84  Wt. (Lbs) 150.08 151.75 147 149.75 148 145 145.04  BMI 27.45 28.21 26.89 27.39 27.07 27.4 27.41       Primary osteoarthritis of left knee Uncontrolled.Toradol and depo medrol administered IM in the office , to be followed by a short course of oral prednisone and NSAIDS.   Acute right hip pain Uncontrolled.Toradol and depo medrol administered IM in the office , to be followed by a short course of oral prednisone and NSAIDS.   Hyperlipidemia LDL goal <100 Hyperlipidemia:Low fat diet discussed and encouraged.   Lipid Panel  Lab Results  Component Value Date   CHOL 244 (H) 12/10/2017   HDL 52 12/10/2017   LDLCALC 174 (H) 12/10/2017   TRIG 75 12/10/2017   CHOLHDL 4.7 12/10/2017   Uncontrolled, needs to commit to dietary change and also to taking prescribed meds

## 2017-12-21 NOTE — Patient Instructions (Addendum)
Annual physical exam  early October, call if you need me sooner  Injections in office today for knee and hip pain, and medication also prescribed  Your blood pressure is high today, please make sure that you take medication at the same time every day and work on regular xercise and healthy food choice, mainly vegetable and fruit to have it controlled  Increase clonidine tablets to TWO at bedtime also because of elevated/ uncontrolled blood pressure  Your cholesterol is very high, please change from fried and fatty foods to more fruit and vegetable so that your this improved and take medication as prescribed  This

## 2017-12-27 ENCOUNTER — Ambulatory Visit (HOSPITAL_COMMUNITY)
Admission: RE | Admit: 2017-12-27 | Discharge: 2017-12-27 | Disposition: A | Discharge status: Home / Self Care | Payer: Medicare HMO | Source: Ambulatory Visit | Attending: Family Medicine | Admitting: Family Medicine

## 2017-12-27 DIAGNOSIS — Z1231 Encounter for screening mammogram for malignant neoplasm of breast: Secondary | ICD-10-CM | POA: Insufficient documentation

## 2018-01-01 IMAGING — MG 2D DIGITAL SCREENING BILATERAL MAMMOGRAM WITH CAD AND ADJUNCT TO
6 of 9 series · 6 of 25 positions shown · non-contrast
Comparison: Previous exam(s).

CLINICAL DATA: Screening.

EXAM:
2D DIGITAL SCREENING BILATERAL MAMMOGRAM WITH CAD AND ADJUNCT TOMO

[R MLO (1 of 2)]
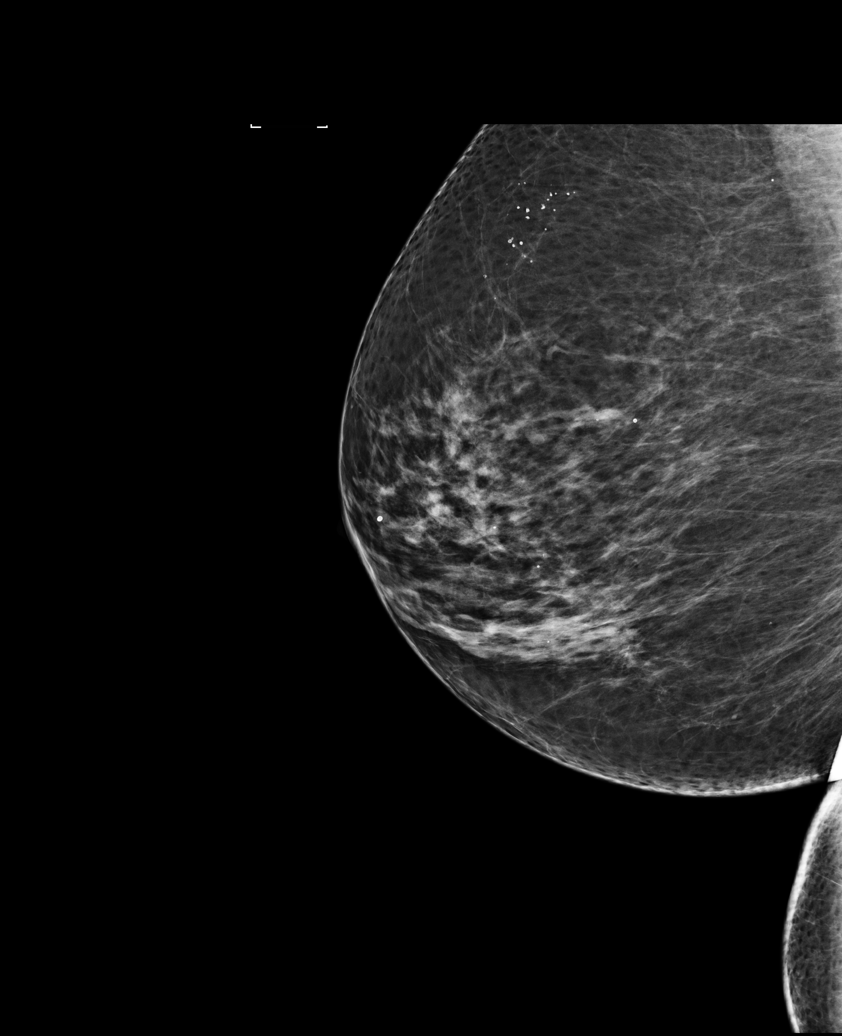

[L CC]
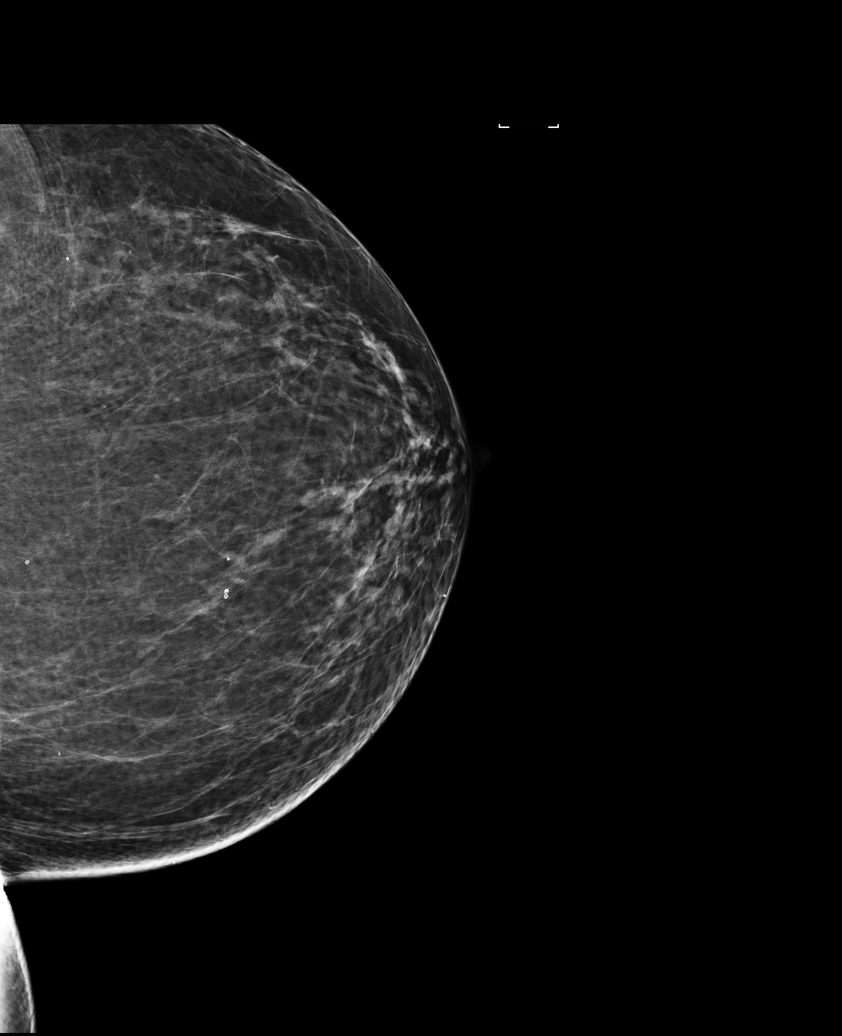

[L MLO]
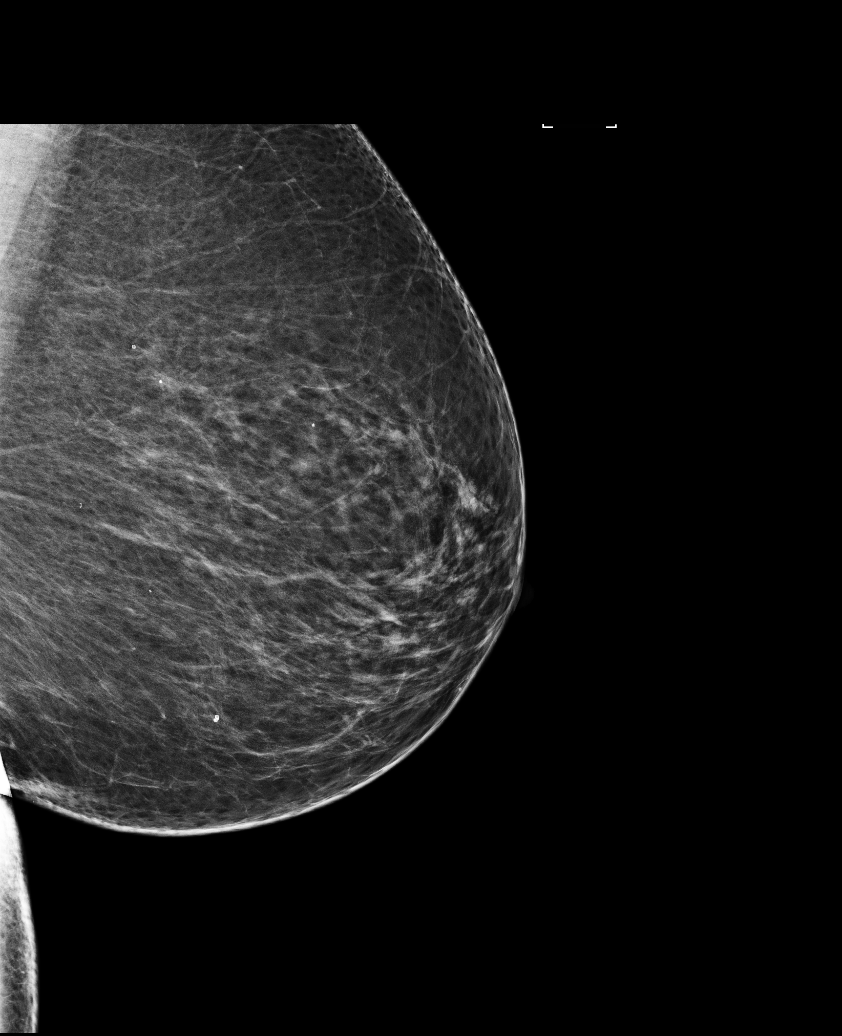

[R CC]
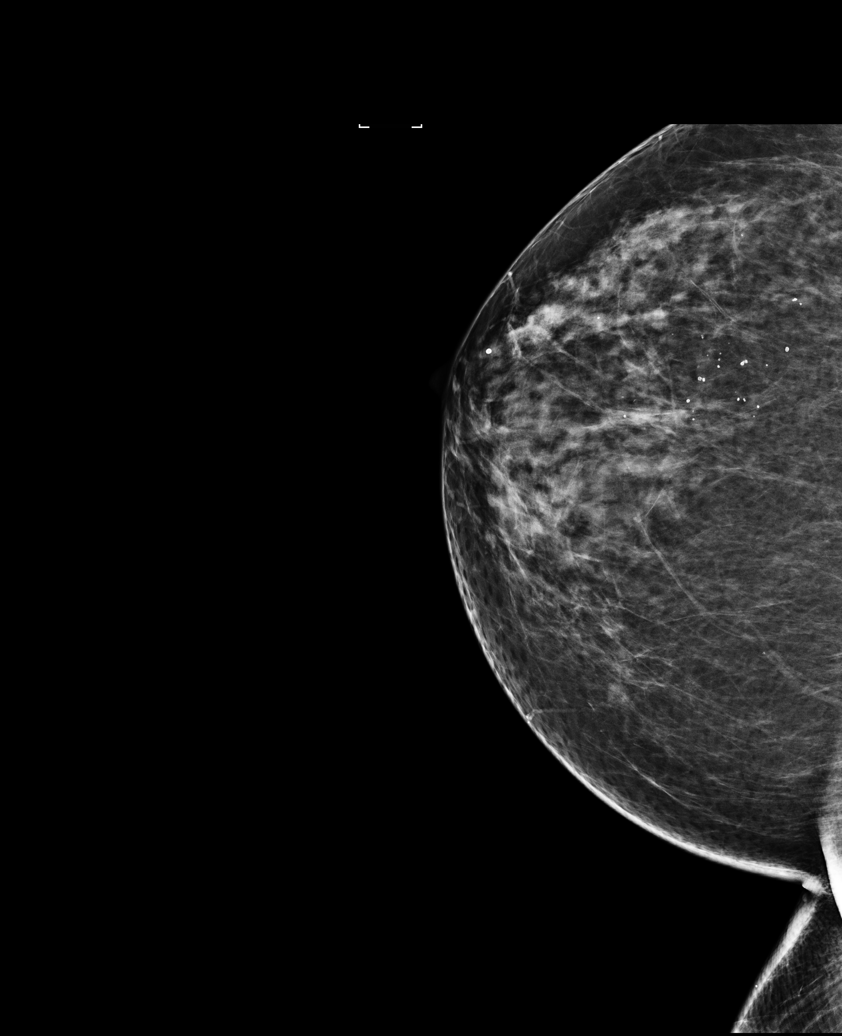

[R MLO (2 of 2)]
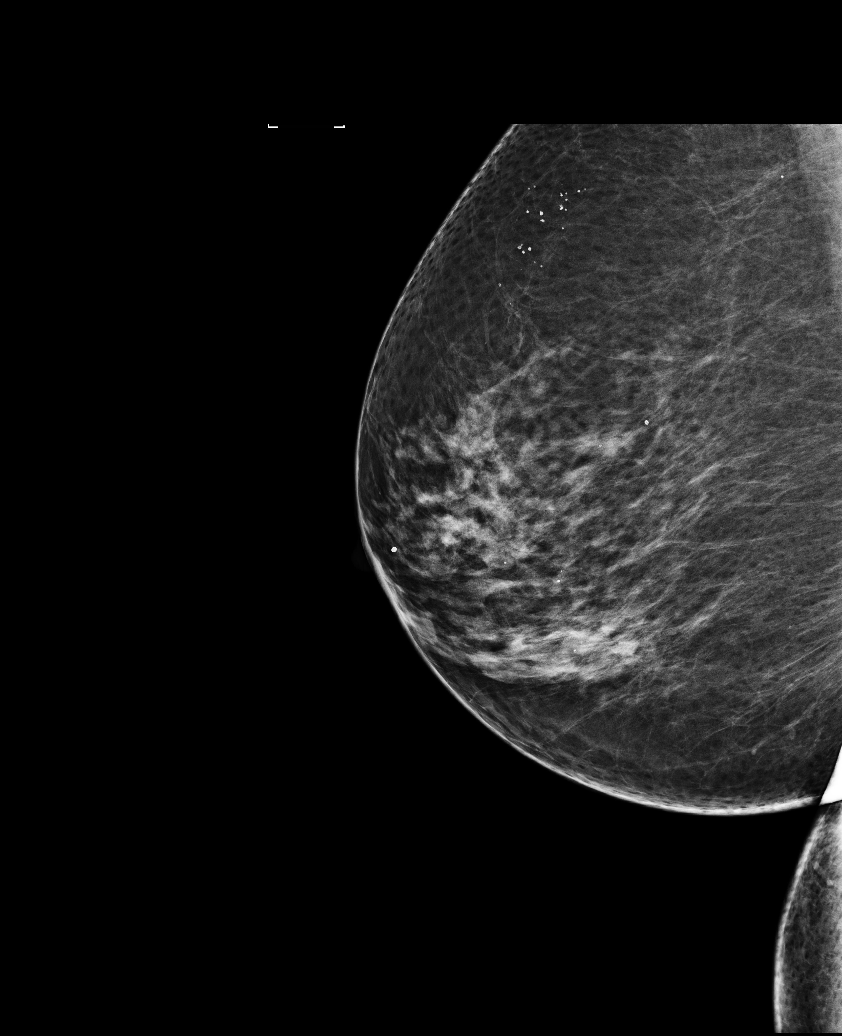

[L MLO tomo · tomo slice 43/84.0]
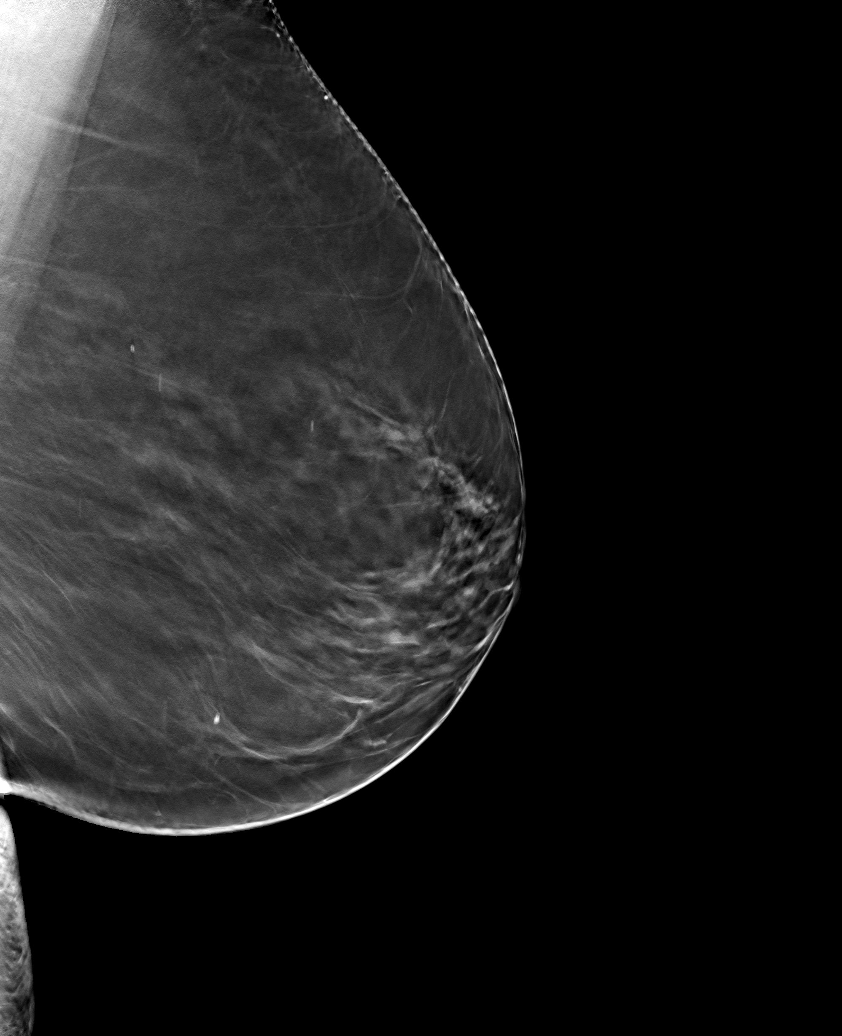

[6 of 25 positions shown; findings below may reference images not displayed]

ACR Breast Density Category b: There are scattered areas of
fibroglandular density.
FINDINGS: There are no findings suspicious for malignancy. Images were
processed with CAD.
IMPRESSION: No mammographic evidence of malignancy. A result letter of this
screening mammogram will be mailed directly to the patient.

RECOMMENDATION:
Screening mammogram in one year. (Code:97-6-RS4)

BI-RADS CATEGORY  1: Negative.

## 2018-01-08 ENCOUNTER — Telehealth: Payer: Self-pay | Admitting: Family Medicine

## 2018-01-08 NOTE — Telephone Encounter (Signed)
Pt aware of results 

## 2018-01-08 NOTE — Telephone Encounter (Signed)
Please call the pt with Mammogram results,,thanks

## 2018-01-15 DIAGNOSIS — H25813 Combined forms of age-related cataract, bilateral: Secondary | ICD-10-CM | POA: Diagnosis not present

## 2018-01-15 DIAGNOSIS — H401131 Primary open-angle glaucoma, bilateral, mild stage: Secondary | ICD-10-CM | POA: Diagnosis not present

## 2018-01-16 ENCOUNTER — Other Ambulatory Visit: Payer: Self-pay

## 2018-01-16 DIAGNOSIS — I10 Essential (primary) hypertension: Secondary | ICD-10-CM

## 2018-01-16 DIAGNOSIS — E785 Hyperlipidemia, unspecified: Secondary | ICD-10-CM

## 2018-01-16 MED ORDER — ALENDRONATE SODIUM 70 MG PO TABS: 70.0000 mg | ORAL_TABLET | ORAL | 3 refills | Status: DC

## 2018-01-24 ENCOUNTER — Ambulatory Visit (INDEPENDENT_AMBULATORY_CARE_PROVIDER_SITE_OTHER): Payer: Medicare HMO

## 2018-01-24 DIAGNOSIS — Z23 Encounter for immunization: Secondary | ICD-10-CM | POA: Diagnosis not present

## 2018-02-06 ENCOUNTER — Ambulatory Visit (INDEPENDENT_AMBULATORY_CARE_PROVIDER_SITE_OTHER): Payer: Medicare HMO | Admitting: Family Medicine

## 2018-02-06 ENCOUNTER — Other Ambulatory Visit: Payer: Self-pay

## 2018-02-06 ENCOUNTER — Encounter: Payer: Self-pay | Admitting: Family Medicine

## 2018-02-06 VITALS — BP 100/72 | HR 65 | Resp 12 | Ht 61.5 in | Wt 150.1 lb

## 2018-02-06 DIAGNOSIS — R7301 Impaired fasting glucose: Secondary | ICD-10-CM | POA: Diagnosis not present

## 2018-02-06 DIAGNOSIS — E785 Hyperlipidemia, unspecified: Secondary | ICD-10-CM

## 2018-02-06 DIAGNOSIS — Z0000 Encounter for general adult medical examination without abnormal findings: Secondary | ICD-10-CM

## 2018-02-06 DIAGNOSIS — K219 Gastro-esophageal reflux disease without esophagitis: Secondary | ICD-10-CM

## 2018-02-06 DIAGNOSIS — I10 Essential (primary) hypertension: Secondary | ICD-10-CM

## 2018-02-06 DIAGNOSIS — Z1211 Encounter for screening for malignant neoplasm of colon: Secondary | ICD-10-CM | POA: Diagnosis not present

## 2018-02-06 MED ORDER — PANTOPRAZOLE SODIUM 40 MG PO TBEC: 40.0000 mg | DELAYED_RELEASE_TABLET | Freq: Every day | ORAL | 0 refills | Status: DC

## 2018-02-06 NOTE — Progress Notes (Signed)
    Colleen [name]     MRN: [id number]      DOB: 1946-01-27  HPI: Patient is in for annual physical exam. C/o increased and uncontrolled heartburn symptoms, denies dysphagia Recent labs, if available are reviewed. Immunization is reviewed    PE: BP 100/72   Pulse 65   Resp 12   Ht 5' 1.5" (1.562 m)   Wt 150 lb 1.3 oz (68.1 kg)   SpO2 97% Comment: room air  BMI 27.90 kg/m   Pleasant  female, alert and oriented x 3, in no cardio-pulmonary distress. Afebrile. HEENT No facial trauma or asymetry. Sinuses non tender.  Extra occullar muscles intact, pupils equally reactive to light. External ears normal, tympanic membranes clear. Oropharynx moist, no exudate. Neck: supple, no adenopathy,JVD or thyromegaly.No bruits.  Chest: Clear to ascultation bilaterally.No crackles or wheezes. Non tender to palpation  Breast: No asymetry,no masses or lumps. No tenderness. No nipple discharge or inversion. No axillary or supraclavicular adenopathy  Cardiovascular system; Heart sounds normal,  S1 and  S2 ,no S3.  No murmur, or thrill. Apical beat not displaced Peripheral pulses normal.  Abdomen: Soft, non tender, no organomegaly or masses. No bruits. Bowel sounds normal. No guarding, tenderness or rebound.  cologuard test to be arranged.     Musculoskeletal exam: Full ROM of spine, hips , shoulders and knees. No deformity ,swelling or crepitus noted. No muscle wasting or atrophy.   Neurologic: Cranial nerves 2 to 12 intact. Power, tone ,sensation and reflexes normal throughout. No disturbance in gait. No tremor.  Skin: Intact, no ulceration, erythema , scaling or rash noted. Pigmentation normal throughout  Psych; Normal mood and affect. Judgement and concentration normal   Assessment & Plan:  Annual physical exam Annual exam as documented. Counseling done  re healthy lifestyle involving commitment to 150 minutes exercise per week, heart healthy diet, and attaining  healthy weight.The importance of adequate sleep also discussed. Regular seat belt use and home safety, is also discussed. Changes in health habits are decided on by the patient with goals and time frames  set for achieving them. Immunization and cancer screening needs are specifically addressed at this visit.   GERD Uncontrolled, c/o heartburn and reflux symptoms worsening in past 4 to 8 weeks. Reviewed lifestyle  modification and dose of PPI increased short term

## 2018-02-06 NOTE — Patient Instructions (Addendum)
F/U with EKG 2nd week in  January, call if you need me before  Please reduce fried and fatty foods, since you cholesterol is too high  Fasting lipid , cmp and eGFr 5, HBA1C 1 week before f/u  Cologuard from office today  For 3 months only, heartburn medication does is increased to 40  Mg daily, after this the dose is reduced to 20 mg   Work on daily exercise  For health

## 2018-02-09 ENCOUNTER — Encounter: Payer: Self-pay | Admitting: Family Medicine

## 2018-02-09 NOTE — Assessment & Plan Note (Signed)
Uncontrolled, c/o heartburn and reflux symptoms worsening in past 4 to 8 weeks. Reviewed lifestyle  modification and dose of PPI increased short term

## 2018-02-09 NOTE — Assessment & Plan Note (Signed)

## 2018-02-14 ENCOUNTER — Telehealth: Payer: Self-pay | Admitting: Family Medicine

## 2018-02-14 NOTE — Telephone Encounter (Signed)
Patient called to ask if she should take her acid reflux medication before or after she eats. I advised to call her pharmacy, but she states its mail order.  Cb@ [phone]

## 2018-02-15 NOTE — Telephone Encounter (Signed)
Patient aware to take it once daily preferably before breakfast

## 2018-02-25 DIAGNOSIS — Z1211 Encounter for screening for malignant neoplasm of colon: Secondary | ICD-10-CM | POA: Diagnosis not present

## 2018-02-25 DIAGNOSIS — Z1212 Encounter for screening for malignant neoplasm of rectum: Secondary | ICD-10-CM | POA: Diagnosis not present

## 2018-03-01 LAB — COLOGUARD
Cologuard: NEGATIVE
Cologuard: NEGATIVE

## 2018-03-11 ENCOUNTER — Encounter: Payer: Self-pay | Admitting: Family Medicine

## 2018-03-11 ENCOUNTER — Other Ambulatory Visit: Payer: Self-pay

## 2018-03-11 ENCOUNTER — Ambulatory Visit (INDEPENDENT_AMBULATORY_CARE_PROVIDER_SITE_OTHER): Payer: Medicare HMO | Admitting: Family Medicine

## 2018-03-11 VITALS — BP 124/80 | HR 89 | Resp 12 | Ht 61.5 in | Wt 149.1 lb

## 2018-03-11 DIAGNOSIS — J301 Allergic rhinitis due to pollen: Secondary | ICD-10-CM | POA: Diagnosis not present

## 2018-03-11 DIAGNOSIS — R05 Cough: Secondary | ICD-10-CM | POA: Diagnosis not present

## 2018-03-11 DIAGNOSIS — R9431 Abnormal electrocardiogram [ECG] [EKG]: Secondary | ICD-10-CM | POA: Diagnosis not present

## 2018-03-11 DIAGNOSIS — E785 Hyperlipidemia, unspecified: Secondary | ICD-10-CM | POA: Diagnosis not present

## 2018-03-11 DIAGNOSIS — R0789 Other chest pain: Secondary | ICD-10-CM

## 2018-03-11 DIAGNOSIS — R059 Cough, unspecified: Secondary | ICD-10-CM | POA: Insufficient documentation

## 2018-03-11 DIAGNOSIS — M549 Dorsalgia, unspecified: Secondary | ICD-10-CM | POA: Diagnosis not present

## 2018-03-11 DIAGNOSIS — M542 Cervicalgia: Secondary | ICD-10-CM

## 2018-03-11 DIAGNOSIS — M25551 Pain in right hip: Secondary | ICD-10-CM

## 2018-03-11 NOTE — Progress Notes (Signed)
   Colleen [name]     MRN: [id number]      DOB: 1945-06-01   HPI Colleen [name] is here with a 10 day h/o excess cough , worse when lying down, no sputum, no fever or noticeable chills, states she hears herself wheezing  When  lying down this has stopped. Has been experiencing substernal chest pressure for the past 6 days , worse when  Coughs and even occurs at rest, pain is 3 , non radiating, no other associated symptoms, not aggravated by movement Had been having excess clear runny nose, no sore throat , or ear pain, took zyrtec for approx 3 days only, and tussin for cough with relief C/o hip and back pain intermittently still concerned about this  ROS   Denies abdominal pain, nausea, vomiting,diarrhea or constipation.   Denies dysuria, frequency, hesitancy or incontinence. Right sided neck pain to elbow rated at  6 persists, unable to tolerate most  Meds, right hip and buttock pain radiaiting to mid thigh down the side prevents walking rated at a  7  Denies headaches, seizures, numbness, or tingling. Denies depression, anxiety or insomnia. Denies skin break down or rash.   PE  BP (!) 152/68 (BP Location: Right Arm, Patient Position: Sitting, Cuff Size: Normal)   Pulse 89   Resp 12   Ht 5' 1.5" (1.562 m)   Wt 149 lb 1.9 oz (67.6 kg)   SpO2 97% Comment: room air  BMI 27.72 kg/m   Patient alert and oriented and in no cardiopulmonary distress.  HEENT: No facial asymmetry, EOMI,   oropharynx pink and moist.  Neck supple no JVD, no mass.  Chest: Clear to auscultation bilaterally.reproducibe chest [pain in 3rd and 4th CC junctions  CVS: S1, S2 no murmurs, no S3.Regular rate. EKG: NSR rate 72, no lVH, no acute ischemia , non specific t wave changes  ABD: Soft non tender.   Ext: No edema  MS: Adequate ROM spine, shoulders, hips and knees.  Skin: Intact, no ulcerations or rash noted.  Psych: Good eye contact, normal affect. Memory intact not anxious or depressed appearing.  CNS: CN  2-12 intact, power,  normal throughout.no focal deficits noted.   Assessment & Plan  Neck pain on right side Chronic pain in left neck needs X ray of C spine  Hip pain, right 6 month h/o right hip pain interfering with walking needs X ray of hip  Back pain with radiation Back pain to right buttock and thigh, ongoing , needs X ray  Chest pain Reproducible anterior chest pain, tylenol for CC pain   Abnormal finding on EKG Recent atypical chest pain witb several CV risk factors and non specific  t wave abnormalities on EKG , refer for cardiology evaluation   Hyperlipidemia LDL goal <100 Hyperlipidemia:Low fat diet discussed and encouraged.   Lipid Panel  Lab Results  Component Value Date   CHOL 244 (H) 12/10/2017   HDL 52 12/10/2017   LDLCALC 174 (H) 12/10/2017   TRIG 75 12/10/2017   CHOLHDL 4.7 12/10/2017     Updated lab needed , uncontrolled and statin intolerant   Cough Cough with chest pain, CXR ordered  ALLERGIC RHINITIS, SEASONAL Uncontrolled, needs to commit to daily medication and allergen avoidance when possinble

## 2018-03-11 NOTE — Patient Instructions (Addendum)
F/u  As before, call if you need me sooner  EKG today  You are having chest wall pain, take tylenol 1 tablet up to 3 times daily for next 5 days  You likely have arthritis in neck and low back , may have in hip  Four Xrays are ordered  Tylenol for pain  You are reeferred for cardiology re evaluation

## 2018-03-11 NOTE — Assessment & Plan Note (Addendum)
Chronic pain in left neck needs X ray of C spine

## 2018-03-11 NOTE — Assessment & Plan Note (Addendum)
Back pain to right buttock and thigh, ongoing , needs X ray

## 2018-03-11 NOTE — Assessment & Plan Note (Addendum)
6 month h/o right hip pain interfering with walking needs X ray of hip

## 2018-03-12 ENCOUNTER — Ambulatory Visit (HOSPITAL_COMMUNITY)
Admission: RE | Admit: 2018-03-12 | Discharge: 2018-03-12 | Disposition: A | Discharge status: Home / Self Care | Payer: Medicare HMO | Source: Ambulatory Visit | Attending: Family Medicine | Admitting: Family Medicine

## 2018-03-12 DIAGNOSIS — M549 Dorsalgia, unspecified: Secondary | ICD-10-CM

## 2018-03-12 DIAGNOSIS — M542 Cervicalgia: Secondary | ICD-10-CM

## 2018-03-12 DIAGNOSIS — M5136 Other intervertebral disc degeneration, lumbar region: Secondary | ICD-10-CM | POA: Insufficient documentation

## 2018-03-12 DIAGNOSIS — M1611 Unilateral primary osteoarthritis, right hip: Secondary | ICD-10-CM | POA: Insufficient documentation

## 2018-03-12 DIAGNOSIS — M50322 Other cervical disc degeneration at C5-C6 level: Secondary | ICD-10-CM | POA: Diagnosis not present

## 2018-03-12 DIAGNOSIS — M47812 Spondylosis without myelopathy or radiculopathy, cervical region: Secondary | ICD-10-CM | POA: Diagnosis not present

## 2018-03-12 DIAGNOSIS — R079 Chest pain, unspecified: Secondary | ICD-10-CM | POA: Diagnosis not present

## 2018-03-12 DIAGNOSIS — R05 Cough: Secondary | ICD-10-CM | POA: Diagnosis not present

## 2018-03-12 DIAGNOSIS — M545 Low back pain: Secondary | ICD-10-CM | POA: Diagnosis not present

## 2018-03-12 DIAGNOSIS — R059 Cough, unspecified: Secondary | ICD-10-CM

## 2018-03-12 DIAGNOSIS — M25551 Pain in right hip: Secondary | ICD-10-CM

## 2018-03-15 ENCOUNTER — Telehealth: Payer: Self-pay

## 2018-03-15 NOTE — Telephone Encounter (Signed)
Called to give patient results of cologuard (negative). No answer. Left generic message requesting call back.

## 2018-03-16 ENCOUNTER — Encounter: Payer: Self-pay | Admitting: Family Medicine

## 2018-03-16 DIAGNOSIS — Z01810 Encounter for preprocedural cardiovascular examination: Secondary | ICD-10-CM

## 2018-03-16 DIAGNOSIS — R9431 Abnormal electrocardiogram [ECG] [EKG]: Secondary | ICD-10-CM | POA: Insufficient documentation

## 2018-03-16 NOTE — Assessment & Plan Note (Signed)
Cough with chest pain, CXR ordered

## 2018-03-16 NOTE — Assessment & Plan Note (Signed)
Hyperlipidemia:Low fat diet discussed and encouraged.   Lipid Panel  Lab Results  Component Value Date   CHOL 244 (H) 12/10/2017   HDL 52 12/10/2017   LDLCALC 174 (H) 12/10/2017   TRIG 75 12/10/2017   CHOLHDL 4.7 12/10/2017     Updated lab needed , uncontrolled and statin intolerant

## 2018-03-16 NOTE — Assessment & Plan Note (Signed)
Uncontrolled, needs to commit to daily medication and allergen avoidance when possinble

## 2018-03-16 NOTE — Assessment & Plan Note (Signed)
Reproducible anterior chest pain, tylenol for CC pain

## 2018-03-16 NOTE — Assessment & Plan Note (Signed)
Recent atypical chest pain witb several CV risk factors and non specific  t wave abnormalities on EKG , refer for cardiology evaluation

## 2018-05-10 DIAGNOSIS — I10 Essential (primary) hypertension: Secondary | ICD-10-CM | POA: Diagnosis not present

## 2018-05-10 DIAGNOSIS — E785 Hyperlipidemia, unspecified: Secondary | ICD-10-CM | POA: Diagnosis not present

## 2018-05-10 DIAGNOSIS — R7301 Impaired fasting glucose: Secondary | ICD-10-CM | POA: Diagnosis not present

## 2018-05-11 ENCOUNTER — Encounter: Payer: Self-pay | Admitting: Family Medicine

## 2018-05-11 LAB — COMPLETE METABOLIC PANEL WITH GFR
AG RATIO: 1.6 (calc) (ref 1.0–2.5)
ALT: 10 U/L (ref 6–29)
AST: 17 U/L (ref 10–35)
Albumin: 4.3 g/dL (ref 3.6–5.1)
Alkaline phosphatase (APISO): 88 U/L (ref 33–130)
BILIRUBIN TOTAL: 0.5 mg/dL (ref 0.2–1.2)
BUN: 14 mg/dL (ref 7–25)
CALCIUM: 9.8 mg/dL (ref 8.6–10.4)
CHLORIDE: 105 mmol/L (ref 98–110)
CO2: 26 mmol/L (ref 20–32)
Creat: 0.84 mg/dL (ref 0.60–0.93)
GFR, Est African American: 81 mL/min/{1.73_m2} (ref >=60)
GFR, Est Non African American: 70 mL/min/{1.73_m2} (ref >=60)
Globulin: 2.7 g/dL (calc) (ref 1.9–3.7)
Glucose, Bld: 88 mg/dL (ref 65–99)
Potassium: 4.2 mmol/L (ref 3.5–5.3)
Sodium: 139 mmol/L (ref 135–146)
Total Protein: 7 g/dL (ref 6.1–8.1)

## 2018-05-11 LAB — LIPID PANEL
CHOL/HDL RATIO: 6.2 (calc) — AB (ref <5.0)
Cholesterol: 281 mg/dL — ABNORMAL HIGH (ref <200)
HDL: 45 mg/dL — ABNORMAL LOW (ref >50)
LDL Cholesterol (Calc): 212 mg/dL (calc) — ABNORMAL HIGH
NON-HDL CHOLESTEROL (CALC): 236 mg/dL — AB (ref <130)
Triglycerides: 110 mg/dL (ref <150)

## 2018-05-11 LAB — HEMOGLOBIN A1C
HEMOGLOBIN A1C: 5.4 %{Hb} (ref <5.7)
Mean Plasma Glucose: 108 (calc)
eAG (mmol/L): 6 (calc)

## 2018-05-13 ENCOUNTER — Encounter: Payer: Self-pay | Admitting: *Deleted

## 2018-05-13 ENCOUNTER — Ambulatory Visit (INDEPENDENT_AMBULATORY_CARE_PROVIDER_SITE_OTHER): Payer: Medicare HMO | Admitting: Cardiology

## 2018-05-13 ENCOUNTER — Encounter: Payer: Self-pay | Admitting: Cardiology

## 2018-05-13 ENCOUNTER — Telehealth: Payer: Self-pay | Admitting: Cardiology

## 2018-05-13 VITALS — BP 124/78 | HR 77 | Ht 61.5 in | Wt 148.0 lb

## 2018-05-13 DIAGNOSIS — E782 Mixed hyperlipidemia: Secondary | ICD-10-CM

## 2018-05-13 DIAGNOSIS — R0789 Other chest pain: Secondary | ICD-10-CM

## 2018-05-13 DIAGNOSIS — I10 Essential (primary) hypertension: Secondary | ICD-10-CM

## 2018-05-13 DIAGNOSIS — R072 Precordial pain: Secondary | ICD-10-CM

## 2018-05-13 DIAGNOSIS — Z789 Other specified health status: Secondary | ICD-10-CM | POA: Diagnosis not present

## 2018-05-13 DIAGNOSIS — R9431 Abnormal electrocardiogram [ECG] [EKG]: Secondary | ICD-10-CM

## 2018-05-13 NOTE — Progress Notes (Signed)
Cardiology Office Note  Date: 05/13/2018   ID: Colleen, [name] May 02, 1946, MRN [id number]  PCP: Kerri Perches, MD  Consulting Cardiologist: Nona Dell, MD   Chief Complaint  Patient presents with  . Chest Pain    History of Present Illness: Colleen [name] is a 73 y.o. female referred for cardiology consultation by Dr. Lodema Hong for evaluation of atypical chest pain and abnormal ECG.  We discussed her symptoms today.  She recalls a single episode of chest fullness that occurred within the last few months, she does not recall the actual details of the situation but remembers it lasting for just a few minutes.  She does not report recurrence of this chest fullness with activity.  She does all of her house chores as well as shopping, reports NYHA class II dyspnea, no chest fullness.  She states that she feels like she is "getting older" and does have to pace herself however.  I reviewed her medications which are listed below.  She reports compliance with medical therapy.  She does have a history of statin intolerance.  Her recent lipid panel showed LDL of 212.  She follows with Dr. Lodema Hong.  She has not undergone any prior objective ischemic testing.  Main cardiac risk factors include hyperlipidemia and hypertension.  Past Medical History:  Diagnosis Date  . GERD (gastroesophageal reflux disease)   . Glaucoma   . Hyperlipidemia   . Hypertension     Past Surgical History:  Procedure Laterality Date  . CHOLECYSTECTOMY    . CHONDROPLASTY Left 12/11/2014   Procedure: CHONDROPLASTY LEFT MEDIAL FEMORAL CONDYLE;  Surgeon: Vickki Hearing, MD;  Location: [facility];  Service: Orthopedics;  Laterality: Left;  . KNEE ARTHROSCOPY WITH LATERAL MENISECTOMY Left 12/11/2014   Procedure: KNEE ARTHROSCOPY WITH LATERAL AND MEDIAL MENISECTOMY;  Surgeon: Vickki Hearing, MD;  Location: [facility];  Service: Orthopedics;  Laterality: Left;    Current Outpatient Medications  Medication Sig  Dispense Refill  . acetaminophen (TYLENOL) 650 MG CR tablet Take 650 mg by mouth every 8 (eight) hours as needed for pain.    Marland Kitchen alendronate (FOSAMAX) 70 MG tablet Take 1 tablet (70 mg total) by mouth every 7 (seven) days. Take with a full glass of water on an empty stomach. 12 tablet 3  . Ascorbic Acid (VITAMIN C) 1000 MG tablet Take 1,000 mg by mouth daily.    Marland Kitchen aspirin EC 81 MG tablet Take 81 mg by mouth daily.    . Calcium Carb-Cholecalciferol (CALTRATE 600+D) 600-800 MG-UNIT TABS One twice daily 120 tablet 11  . cloNIDine (CATAPRES) 0.1 MG tablet One tabet at bedtime 90 tablet 3  . fluticasone (FLONASE) 50 MCG/ACT nasal spray Place 2 sprays into both nostrils daily. 48 g 1  . latanoprost (XALATAN) 0.005 % ophthalmic solution Place 1 drop into both eyes at bedtime.  12  . lisinopril-hydrochlorothiazide (PRINZIDE,ZESTORETIC) 20-25 MG tablet TAKE 1 TABLET DAILY 90 tablet 1  . Multiple Vitamin (MULTIVITAMIN) tablet Take 1 tablet by mouth daily.    . pantoprazole (PROTONIX) 40 MG tablet Take 1 tablet (40 mg total) by mouth daily. 90 tablet 0  . Polyethylene Glycol 400 (BLINK TEARS OP) Place 1 drop into both eyes 3 (three) times daily.     No current facility-administered medications for this visit.    Allergies:  Aspirin; Fenofibrate; and Statins   Social History: The patient  reports that she has never smoked. She has never used smokeless tobacco. She reports that  she does not drink alcohol or use drugs.   Family History: The patient's family history includes Diabetes in her father, sister, and sister; Heart disease in her sister; Heart disease (age of onset: 4582) in her mother; Hypertension in her brother, father, mother, sister, and sister; Kidney disease in her sister; Multiple sclerosis in her sister; Schizophrenia (age of onset: 8032) in her brother; Stroke in her father.   ROS:  Please see the history of present illness. Otherwise, complete review of systems is positive for arthritic pains  in her hips and shoulders.  All other systems are reviewed and negative.   Physical Exam: VS:  BP 124/78   Pulse 77   Ht 5' 1.5" (1.562 m)   Wt 148 lb (67.1 kg)   SpO2 97%   BMI 27.51 kg/m , BMI Body mass index is 27.51 kg/m.  Wt Readings from Last 3 Encounters:  05/13/18 148 lb (67.1 kg)  03/11/18 149 lb 1.9 oz (67.6 kg)  02/06/18 150 lb 1.3 oz (68.1 kg)    General: Patient appears comfortable at rest. HEENT: Conjunctiva and lids normal, oropharynx clear. Neck: Supple, no elevated JVP or carotid bruits, no thyromegaly. Lungs: Clear to auscultation, nonlabored breathing at rest. Cardiac: Regular rate and rhythm, no S3 or significant systolic murmur, no pericardial rub. Abdomen: Soft, nontender, bowel sounds present. Extremities: No pitting edema, distal pulses 2+. Skin: Warm and dry. Musculoskeletal: No kyphosis. Neuropsychiatric: Alert and oriented x3, affect grossly appropriate.  ECG: I personally reviewed the tracing from 03/11/2018 which shows sinus rhythm with nonspecific ST-T changes.  Recent Labwork: 12/10/2017: Hemoglobin 12.5; Platelets 247; TSH 1.83 05/10/2018: ALT 10; AST 17; BUN 14; Creat 0.84; Potassium 4.2; Sodium 139     Component Value Date/Time   CHOL 281 (H) 05/10/2018 0944   TRIG 110 05/10/2018 0944   HDL 45 (L) 05/10/2018 0944   CHOLHDL 6.2 (H) 05/10/2018 0944   VLDL 14 07/10/2016 0910   LDLCALC 212 (H) 05/10/2018 0944    Other Studies Reviewed Today:  Chest x-ray 03/12/2018: FINDINGS: Lungs clear. Heart size normal. No pneumothorax or pleural fluid. No acute or focal bony abnormality. Cholecystectomy clips noted.  IMPRESSION: Negative chest.  Assessment and Plan:  1.  Episode of chest fullness as outlined above in a 73 year old woman with hypertension and significant hyperlipidemia with statin intolerance.  I reviewed her ECG which shows sinus rhythm with nonspecific ST-T wave changes.  She has not undergone any previous objective ischemic  testing.  She reports limitations related to arthritic hip pain.  We will obtain a Lexiscan Myoview for further evaluation.  2.  Hyperlipidemia with history of statin intolerance, follows with Dr. Lodema HongSimpson.  Recent lab work showed LDL 212.  I am not certain about the details of her statin intolerance, but she could be considered for a trial of Zetia plus statin at lower dose, if this is not tolerated she might be a candidate for Repatha depending on insurance coverage.  Will defer to Dr. Lodema HongSimpson.  3.  Essential hypertension, on clonidine and Prinzide.  Blood pressure is well controlled today.  Current medicines were reviewed with the patient today.   Orders Placed This Encounter  Procedures  . NM Myocar Multi W/Spect W/Wall Motion / EF    Disposition: Call with test results.  Signed, Jonelle SidleSamuel G. , MD, [facility] CenterFACC 05/13/2018 10:50 AM    [facility] [address], [place], [place] [address] Phone: [phone]; Fax: [phone]

## 2018-05-13 NOTE — Patient Instructions (Signed)
Medication Instructions:  Continue all current medications.  Labwork: none  Testing/Procedures:  Your physician has requested that you have a lexiscan myoview. For further information please visit [email or web address] Please follow instruction sheet, as given.  Office will contact with results via phone or letter.    Follow-Up: Pending test result.    Any Other Special Instructions Will Be Listed Below (If Applicable).  If you need a refill on your cardiac medications before your next appointment, please call your pharmacy.

## 2018-05-13 NOTE — Telephone Encounter (Signed)
Pre-cert Verification for the following procedure    

## 2018-05-13 NOTE — Telephone Encounter (Signed)
Pre-cert Verification for the following procedure   Lexiscan scheduled for 05-21-2018 at [facility]

## 2018-05-16 ENCOUNTER — Encounter: Payer: Self-pay | Admitting: Family Medicine

## 2018-05-16 ENCOUNTER — Ambulatory Visit (INDEPENDENT_AMBULATORY_CARE_PROVIDER_SITE_OTHER): Payer: Medicare HMO | Admitting: Family Medicine

## 2018-05-16 VITALS — BP 124/82 | HR 79 | Resp 15 | Ht 61.5 in | Wt 147.0 lb

## 2018-05-16 DIAGNOSIS — M25551 Pain in right hip: Secondary | ICD-10-CM | POA: Diagnosis not present

## 2018-05-16 DIAGNOSIS — M8580 Other specified disorders of bone density and structure, unspecified site: Secondary | ICD-10-CM

## 2018-05-16 DIAGNOSIS — I10 Essential (primary) hypertension: Secondary | ICD-10-CM

## 2018-05-16 DIAGNOSIS — E785 Hyperlipidemia, unspecified: Secondary | ICD-10-CM | POA: Diagnosis not present

## 2018-05-16 MED ORDER — PREDNISONE 5 MG (21) PO TBPK: 5.0000 mg | ORAL_TABLET | ORAL | 0 refills | Status: DC

## 2018-05-16 MED ORDER — CLONIDINE HCL 0.1 MG PO TABS: 0.1000 mg | ORAL_TABLET | Freq: Two times a day (BID) | ORAL | 3 refills | Status: DC

## 2018-05-16 MED ORDER — COENZYME Q10 30 MG PO CAPS: 30.0000 mg | ORAL_CAPSULE | Freq: Every day | ORAL | 3 refills | Status: DC

## 2018-05-16 NOTE — Patient Instructions (Addendum)
Wellness with nurse due April 16 or after please schedule  Fasting lipid, cmp and EGFR first week in July  F/U with MD in 2nd week in July, call if you need me sooner  New for cholesterol is co Q 10 this is at your local pharmacy  6 day course of prednisone to be started next week Wednesday/ Thursday AFTER your stress test  Take clonidine as you have been , one two times daily   Cholesterol  Cholesterol is a fat. Your body needs a small amount of cholesterol. Cholesterol (plaque) may build up in your blood vessels (arteries). That makes you more likely to have a heart attack or stroke. You cannot feel your cholesterol level. Having a blood test is the only way to find out if your level is high. Keep your test results. Work with your doctor to keep your cholesterol at a good level. What do the results mean?  Total cholesterol is how much cholesterol is in your blood.  LDL is bad cholesterol. This is the type that can build up. Try to have low LDL.  HDL is good cholesterol. It cleans your blood vessels and carries LDL away. Try to have high HDL.  Triglycerides are fat that the body can store or burn for energy. What are good levels of cholesterol?  Total cholesterol below 200.  LDL below 100 is good for people who have health risks. LDL below 70 is good for people who have very high risks.  HDL above 40 is good. It is best to have HDL of 60 or higher.  Triglycerides below 150. How can I lower my cholesterol? Diet Follow your diet program as told by your doctor.  Choose fish, white meat chicken, or [place] that is roasted or baked. Try not to eat red meat, fried foods, sausage, or lunch meats.  Eat lots of fresh fruits and vegetables.  Choose whole grains, beans, pasta, potatoes, and cereals.  Choose olive oil, corn oil, or canola oil. Only use small amounts.  Try not to eat butter, mayonnaise, shortening, or palm kernel oils.  Try not to eat foods with trans  fats.  Choose low-fat or nonfat dairy foods. ? Drink skim or nonfat milk. ? Eat low-fat or nonfat yogurt and cheeses. ? Try not to drink whole milk or cream. ? Try not to eat ice cream, egg yolks, or full-fat cheeses.  Healthy desserts include angel food cake, ginger snaps, animal crackers, hard candy, popsicles, and low-fat or nonfat frozen yogurt. Try not to eat pastries, cakes, pies, and cookies.  Exercise Follow your exercise program as told by your doctor.  Be more active. Try gardening, walking, and taking the stairs.  Ask your doctor about ways that you can be more active. Medicine  Take over-the-counter and prescription medicines only as told by your doctor. This information is not intended to replace advice given to you by your health care provider. Make sure you discuss any questions you have with your health care provider. Document Released: 07/21/2008 Document Revised: 11/24/2015 Document Reviewed: 11/04/2015 Elsevier Interactive Patient Education  2019 [employer] and Cholesterol Restricted Eating Plan Getting too much fat and cholesterol in your diet may cause health problems. Choosing the right foods helps keep your fat and cholesterol at normal levels. This can keep you from getting certain diseases. Your doctor may recommend an eating plan that includes:  Total fat: ______% or less of total calories a day.  Saturated fat: ______% or less of  total calories a day.  Cholesterol: less than _________mg a day.  Fiber: ______g a day. What are tips for following this plan? Meal planning  At meals, divide your plate into four equal parts: ? Fill one-half of your plate with vegetables and green salads. ? Fill one-fourth of your plate with whole grains. ? Fill one-fourth of your plate with low-fat (lean) protein foods.  Eat fish that is high in omega-3 fats at least two times a week. This includes mackerel, tuna, sardines, and salmon.  Eat foods that are high  in fiber, such as whole grains, beans, apples, broccoli, carrots, peas, and barley. General tips   Work with your doctor to lose weight if you need to.  Avoid: ? Foods with added sugar. ? Fried foods. ? Foods with partially hydrogenated oils.  Limit alcohol intake to no more than 1 drink a day for nonpregnant women and 2 drinks a day for men. One drink equals 12 oz of beer, 5 oz of wine, or 1 oz of hard liquor. Reading food labels  Check food labels for: ? Trans fats. ? Partially hydrogenated oils. ? Saturated fat (g) in each serving. ? Cholesterol (mg) in each serving. ? Fiber (g) in each serving.  Choose foods with healthy fats, such as: ? Monounsaturated fats. ? Polyunsaturated fats. ? Omega-3 fats.  Choose grain products that have whole grains. Look for the word "whole" as the first word in the ingredient list. Cooking  Cook foods using low-fat methods. These include baking, boiling, grilling, and broiling.  Eat more home-cooked foods. Eat at restaurants and buffets less often.  Avoid cooking using saturated fats, such as butter, cream, palm oil, palm kernel oil, and coconut oil. Recommended foods  Fruits  All fresh, canned (in natural juice), or frozen fruits. Vegetables  Fresh or frozen vegetables (raw, steamed, roasted, or grilled). Green salads. Grains  Whole grains, such as whole wheat or whole grain breads, crackers, cereals, and pasta. Unsweetened oatmeal, bulgur, barley, quinoa, or brown rice. Corn or whole wheat flour tortillas. Meats and other protein foods  Ground beef (85% or leaner), grass-fed beef, or beef trimmed of fat. Skinless chicken or [place]. Ground chicken or [place]. Pork trimmed of fat. All fish and seafood. Egg whites. Dried beans, peas, or lentils. Unsalted nuts or seeds. Unsalted canned beans. Nut butters without added sugar or oil. Dairy  Low-fat or nonfat dairy products, such as skim or 1% milk, 2% or reduced-fat cheeses, low-fat and  fat-free ricotta or cottage cheese, or plain low-fat and nonfat yogurt. Fats and oils  Tub margarine without trans fats. Light or reduced-fat mayonnaise and salad dressings. Avocado. Olive, canola, sesame, or safflower oils. The items listed above may not be a complete list of foods and beverages you can eat. Contact a dietitian for more information. Foods to avoid Fruits  Canned fruit in heavy syrup. Fruit in cream or butter sauce. Fried fruit. Vegetables  Vegetables cooked in cheese, cream, or butter sauce. Fried vegetables. Grains  White bread. White pasta. White rice. Cornbread. Bagels, pastries, and croissants. Crackers and snack foods that contain trans fat and hydrogenated oils. Meats and other protein foods  Fatty cuts of meat. Ribs, chicken wings, bacon, sausage, bologna, salami, chitterlings, fatback, hot dogs, bratwurst, and packaged lunch meats. Liver and organ meats. Whole eggs and egg yolks. Chicken and [place] with skin. Fried meat. Dairy  Whole or 2% milk, cream, half-and-half, and cream cheese. Whole milk cheeses. Whole-fat or sweetened yogurt. Full-fat cheeses. Nondairy  creamers and whipped toppings. Processed cheese, cheese spreads, and cheese curds. Beverages  Alcohol. Sugar-sweetened drinks such as sodas, lemonade, and fruit drinks. Fats and oils  Butter, stick margarine, lard, shortening, ghee, or bacon fat. Coconut, palm kernel, and palm oils. Sweets and desserts  Corn syrup, sugars, honey, and molasses. Candy. Jam and jelly. Syrup. Sweetened cereals. Cookies, pies, cakes, donuts, muffins, and ice cream. The items listed above may not be a complete list of foods and beverages you should avoid. Contact a dietitian for more information. Summary  Choosing the right foods helps keep your fat and cholesterol at normal levels. This can keep you from getting certain diseases.  At meals, fill one-half of your plate with vegetables and green salads.  Eat high-fiber  foods, like whole grains, beans, apples, carrots, peas, and barley.  Limit added sugar, saturated fats, alcohol, and fried foods. This information is not intended to replace advice given to you by your health care provider. Make sure you discuss any questions you have with your health care provider. Document Released: 10/24/2011 Document Revised: 12/26/2017 Document Reviewed: 01/15/2017 Elsevier Interactive Patient Education  2019 [employer].

## 2018-05-21 ENCOUNTER — Ambulatory Visit (HOSPITAL_COMMUNITY)
Admission: RE | Admit: 2018-05-21 | Discharge: 2018-05-21 | Disposition: A | Discharge status: Home / Self Care | Payer: Medicare HMO | Source: Ambulatory Visit | Attending: Cardiology | Admitting: Cardiology

## 2018-05-21 ENCOUNTER — Encounter (HOSPITAL_COMMUNITY)
Admission: RE | Admit: 2018-05-21 | Discharge: 2018-05-21 | Disposition: A | Discharge status: Home / Self Care | Payer: Medicare HMO | Source: Ambulatory Visit | Attending: Cardiology | Admitting: Cardiology

## 2018-05-21 DIAGNOSIS — R9431 Abnormal electrocardiogram [ECG] [EKG]: Secondary | ICD-10-CM | POA: Diagnosis not present

## 2018-05-21 DIAGNOSIS — R072 Precordial pain: Secondary | ICD-10-CM | POA: Diagnosis not present

## 2018-05-21 LAB — NM MYOCAR MULTI W/SPECT W/WALL MOTION / EF
CHL CUP RESTING HR STRESS: 51 {beats}/min
CSEPPHR: 82 {beats}/min
LVDIAVOL: 45 mL (ref 46–106)
LVSYSVOL: 14 mL
NUC STRESS TID: 1.28
RATE: 0.23
SDS: 2
SRS: 0
SSS: 2

## 2018-05-21 MED ORDER — TECHNETIUM TC 99M TETROFOSMIN IV KIT
30.0000 | PACK | Freq: Once | INTRAVENOUS | Status: AC | PRN
  Administered 2018-05-21: 28.9 via INTRAVENOUS

## 2018-05-21 MED ORDER — REGADENOSON 0.4 MG/5ML IV SOLN
INTRAVENOUS | Status: AC
  Administered 2018-05-21: 0.4 mg via INTRAVENOUS
  Filled 2018-05-21: qty 5

## 2018-05-21 MED ORDER — SODIUM CHLORIDE 0.9% FLUSH
INTRAVENOUS | Status: AC
  Administered 2018-05-21: 10 mL via INTRAVENOUS
  Filled 2018-05-21: qty 10

## 2018-05-21 MED ORDER — TECHNETIUM TC 99M TETROFOSMIN IV KIT
10.0000 | PACK | Freq: Once | INTRAVENOUS | Status: AC | PRN
  Administered 2018-05-21: 9.29 via INTRAVENOUS

## 2018-05-22 ENCOUNTER — Telehealth: Payer: Self-pay | Admitting: *Deleted

## 2018-05-22 NOTE — Telephone Encounter (Signed)
Patient informed. 

## 2018-05-22 NOTE — Telephone Encounter (Signed)
-----   Message from Jonelle SidleSamuel G McDowell, MD sent at 05/22/2018 11:47 AM EST ----- Results reviewed.  Please let her know that the stress test was normal, overall reassuring.  She should keep follow-up with Dr. Lodema HongSimpson for further risk factor modification. A copy of this test should be forwarded to Kerri PerchesSimpson, Margaret E, MD.

## 2018-05-26 NOTE — Progress Notes (Signed)
   Colleen [name]     MRN: [id number]      DOB: 1945-08-17   HPI Colleen [name] is here for follow up and re-evaluation of chronic medical conditions, medication management and review of any available recent lab and radiology data.  Preventive health is updated, specifically  Cancer screening and Immunization.   Questions or concerns regarding consultations or procedures which the PT has had in the interim are  addressed. The PT denies any adverse reactions to current medications since the last visit.  C/o increased right hip pain,over past 1 week  ROS Denies recent fever or chills. Denies sinus pressure, nasal congestion, ear pain or sore throat. Denies chest congestion, productive cough or wheezing. Denies chest pains, palpitations and leg swelling Denies abdominal pain, nausea, vomiting,diarrhea or constipation.   Denies dysuria, frequency, hesitancy or incontinence.  Denies headaches, seizures, numbness, or tingling. Denies depression, anxiety or insomnia. Denies skin break down or rash.   PE  BP 124/82   Pulse 79   Resp 15   Ht 5' 1.5" (1.562 m)   Wt 147 lb (66.7 kg)   SpO2 95%   BMI 27.33 kg/m   Patient alert and oriented and in no cardiopulmonary distress.  HEENT: No facial asymmetry, EOMI,   oropharynx pink and moist.  Neck supple no JVD, no mass.  Chest: Clear to auscultation bilaterally.  CVS: S1, S2 no murmurs, no S3.Regular rate.  ABD: Soft non tender.   Ext: No edema  MS: Adequate ROM spine, shoulders, and knees.Reduced in right hip  Skin: Intact, no ulcerations or rash noted.  Psych: Good eye contact, normal affect. Memory intact not anxious or depressed appearing.  CNS: CN 2-12 intact, power,  normal throughout.no focal deficits noted.   Assessment & Plan  Essential hypertension Controlled, no change in medication DASH diet and commitment to daily physical activity for a minimum of 30 minutes discussed and encouraged, as a part of hypertension  management. The importance of attaining a healthy weight is also discussed.  BP/Weight 05/16/2018 05/13/2018 03/11/2018 02/06/2018 12/17/2017 08/20/2017 06/18/2017  Systolic BP 124 124 124 100 148 132 140  Diastolic BP 82 78 80 72 66 60 82  Wt. (Lbs) 147 148 149.12 150.08 150.08 151.75 147  BMI 27.33 27.51 27.72 27.9 27.45 28.21 26.89       Hyperlipidemia LDL goal <100 Uncontrolled and statin intolerant, trial of CoQ 10 Hyperlipidemia:Low fat diet discussed and encouraged.   Lipid Panel  Lab Results  Component Value Date   CHOL 281 (H) 05/10/2018   HDL 45 (L) 05/10/2018   LDLCALC 212 (H) 05/10/2018   TRIG 110 05/10/2018   CHOLHDL 6.2 (H) 05/10/2018       Hip pain, right Acute flare, prednisone dose pack prescribed for use after stress test  Osteopenia Continue fosamax and calcium with D supplement

## 2018-05-26 NOTE — Assessment & Plan Note (Signed)
Uncontrolled and statin intolerant, trial of CoQ 10 Hyperlipidemia:Low fat diet discussed and encouraged.   Lipid Panel  Lab Results  Component Value Date   CHOL 281 (H) 05/10/2018   HDL 45 (L) 05/10/2018   LDLCALC 212 (H) 05/10/2018   TRIG 110 05/10/2018   CHOLHDL 6.2 (H) 05/10/2018

## 2018-05-26 NOTE — Assessment & Plan Note (Signed)
Acute flare, prednisone dose pack prescribed for use after stress test

## 2018-05-26 NOTE — Assessment & Plan Note (Signed)
Continue fosamax and calcium with D supplement

## 2018-05-26 NOTE — Assessment & Plan Note (Signed)
Controlled, no change in medication DASH diet and commitment to daily physical activity for a minimum of 30 minutes discussed and encouraged, as a part of hypertension management. The importance of attaining a healthy weight is also discussed.  BP/Weight 05/16/2018 05/13/2018 03/11/2018 02/06/2018 12/17/2017 08/20/2017 06/18/2017  Systolic BP 124 124 124 100 148 132 140  Diastolic BP 82 78 80 72 66 60 82  Wt. (Lbs) 147 148 149.12 150.08 150.08 151.75 147  BMI 27.33 27.51 27.72 27.9 27.45 28.21 26.89

## 2018-08-22 NOTE — Progress Notes (Signed)
Subjective:   Colleen [name] is a 73 y.o. female who presents for Medicare Annual (Subsequent) preventive examination.  Location of Patient: Home Location of Provider: Telehealth Consent was obtain for visit to be over via telehealth.  Review of Systems:   Cardiac Risk Factors include: advanced age (>6855men, 20>65 women);hypertension;dyslipidemia     Objective:     Vitals: There were no vitals taken for this visit.  There is no height or weight on file to calculate BMI.  Advanced Directives 08/20/2017 11/15/2016 07/13/2016 01/21/2015 01/14/2015 01/12/2015 01/05/2015  Does Patient Have a Medical Advance Directive? No No No No No No No  Does patient want to make changes to medical advance directive? Yes (MAU/Ambulatory/Procedural Areas - Information given) - - - - - -  Would patient like information on creating a medical advance directive? - No - Patient declined Yes (MAU/Ambulatory/Procedural Areas - Information given) No - patient declined information No - patient declined information Yes - Transport plannerducational materials given Yes - Transport plannerducational materials given    Tobacco Social History   Tobacco Use  Smoking Status Never Smoker  Smokeless Tobacco Never Used     Counseling given: Not Answered   Clinical Intake:     Pain : No/denies pain     Diabetes: No  How often do you need to have someone help you when you read instructions, pamphlets, or other written materials from your doctor or pharmacy?: 1 - Never What is the last grade level you completed in school?: 12th   Interpreter Needed?: No  Information entered by :: brandi hudy   Past Medical History:  Diagnosis Date  . GERD (gastroesophageal reflux disease)   . Glaucoma   . Hyperlipidemia   . Hypertension    Past Surgical History:  Procedure Laterality Date  . CHOLECYSTECTOMY    . CHONDROPLASTY Left 12/11/2014   Procedure: CHONDROPLASTY LEFT MEDIAL FEMORAL CONDYLE;  Surgeon: Vickki HearingStanley E Harrison, MD;  Location: [facility];  Service:  Orthopedics;  Laterality: Left;  . KNEE ARTHROSCOPY WITH LATERAL MENISECTOMY Left 12/11/2014   Procedure: KNEE ARTHROSCOPY WITH LATERAL AND MEDIAL MENISECTOMY;  Surgeon: Vickki HearingStanley E Harrison, MD;  Location: [facility];  Service: Orthopedics;  Laterality: Left;   Family History  Problem Relation Age of Onset  . Hypertension Mother   . Heart disease Mother [id number]       MI  . Hypertension Father   . Diabetes Father   . Stroke Father   . Hypertension Sister   . Diabetes Sister   . Multiple sclerosis Sister   . Hypertension Sister   . Diabetes Sister   . Heart disease Sister   . Kidney disease Sister   . Hypertension Brother   . Schizophrenia Brother [id number]   Social History   Socioeconomic History  . Marital status: Married    Spouse name: Not on file  . Number of children: Not on file  . Years of education: Not on file  . Highest education level: 12th grade  Occupational History  . Not on file  Social Needs  . Financial resource strain: Not hard at all  . Food insecurity:    Worry: Never true    Inability: Never true  . Transportation needs:    Medical: No    Non-medical: No  Tobacco Use  . Smoking status: Never Smoker  . Smokeless tobacco: Never Used  Substance and Sexual Activity  . Alcohol use: No  . Drug use: No  . Sexual activity: Never  Lifestyle  .  Physical activity:    Days per week: 5 days    Minutes per session: 20 min  . Stress: Not at all  Relationships  . Social connections:    Talks on phone: More than three times a week    Gets together: Once a week    Attends religious service: More than 4 times per year    Active member of club or organization: No    Attends meetings of clubs or organizations: Never    Relationship status: Married  Other Topics Concern  . Not on file  Social History Narrative  . Not on file    Outpatient Encounter Medications as of 08/23/2018  Medication Sig  . acetaminophen (TYLENOL) 650 MG CR tablet Take 650 mg by mouth every 8  (eight) hours as needed for pain.  Marland Kitchen alendronate (FOSAMAX) 70 MG tablet Take 1 tablet (70 mg total) by mouth every 7 (seven) days. Take with a full glass of water on an empty stomach.  . Ascorbic Acid (VITAMIN C) 1000 MG tablet Take 1,000 mg by mouth daily.  Marland Kitchen aspirin EC 81 MG tablet Take 81 mg by mouth daily.  . Calcium Carb-Cholecalciferol (CALTRATE 600+D) 600-800 MG-UNIT TABS One twice daily  . cloNIDine (CATAPRES) 0.1 MG tablet Take 1 tablet (0.1 mg total) by mouth 2 (two) times daily.  Marland Kitchen co-enzyme Q-10 30 MG capsule Take 1 capsule (30 mg total) by mouth daily.  . fluticasone (FLONASE) 50 MCG/ACT nasal spray Place 2 sprays into both nostrils daily.  Marland Kitchen latanoprost (XALATAN) 0.005 % ophthalmic solution Place 1 drop into both eyes at bedtime.  Marland Kitchen lisinopril-hydrochlorothiazide (PRINZIDE,ZESTORETIC) 20-25 MG tablet TAKE 1 TABLET DAILY  . Multiple Vitamin (MULTIVITAMIN) tablet Take 1 tablet by mouth daily.  . pantoprazole (PROTONIX) 40 MG tablet Take 1 tablet (40 mg total) by mouth daily.  . Polyethylene Glycol 400 (BLINK TEARS OP) Place 1 drop into both eyes 3 (three) times daily.  . predniSONE (STERAPRED UNI-PAK 21 TAB) 5 MG (21) TBPK tablet Take 1 tablet (5 mg total) by mouth as directed. Use as directed   No facility-administered encounter medications on file as of 08/23/2018.     Activities of Daily Living In your present state of health, do you have any difficulty performing the following activities: 08/23/2018  Hearing? Y  Comment sometimes it seems  Vision? N  Difficulty concentrating or making decisions? N  Walking or climbing stairs? N  Dressing or bathing? Y  Doing errands, shopping? N  Preparing Food and eating ? N  Using the Toilet? N  In the past six months, have you accidently leaked urine? N  Do you have problems with loss of bowel control? N  Managing your Medications? N  Managing your Finances? N  Housekeeping or managing your Housekeeping? N  Some recent data might be  hidden    Patient Care Team: Kerri Perches, MD as PCP - General Diona Browner Illene Bolus, MD as PCP - Cardiology (Cardiology) Vickki Hearing, MD as Consulting Physician (Orthopedic Surgery)    Assessment:   This is a routine wellness examination for [place].  Exercise Activities and Dietary recommendations Current Exercise Habits: Home exercise routine, Type of exercise: walking, Time (Minutes): 20, Frequency (Times/Week): 5, Weekly Exercise (Minutes/Week): 100, Intensity: Mild  Goals    . Exercise 3x per week (30 min per time)     Recommend increasing your exercise to 30 minutes a day for at least 3 days a week.  Fall Risk Fall Risk  08/22/2018 05/16/2018 03/11/2018 02/06/2018 08/20/2017  Falls in the past year? 0 0 0 No Yes  Number falls in past yr: 0 - - - 1  Injury with Fall? 0 - - - Yes   Is the patient's home free of loose throw rugs in walkways, pet beds, electrical cords, etc?   yes      Grab bars in the bathroom? yes      Handrails on the stairs?   yes      Adequate lighting?   yes  Timed Get Up and Go performed: unable to assess-telemed visit  Depression Screen PHQ 2/9 Scores 08/22/2018 05/16/2018 03/11/2018 02/06/2018  PHQ - 2 Score 0 0 0 2  PHQ- 9 Score - 0 1 5     Cognitive Function     6CIT Screen 08/23/2018 08/20/2017 07/13/2016  What Year? 0 points 0 points 0 points  What month? 0 points 0 points 0 points  What time? 0 points 0 points 0 points  Count back from 20 0 points 0 points 0 points  Months in reverse 0 points 0 points 0 points  Repeat phrase 4 points 4 points 0 points  Total Score 4 4 0    Immunization History  Administered Date(s) Administered  . Influenza Split 03/08/2011  . Influenza Whole 01/22/2007, 02/04/2009, 01/12/2010  . Influenza,inj,Quad PF,6+ Mos 01/29/2013, 03/16/2014, 03/09/2015, 01/07/2016, 01/15/2017, 01/24/2018  . Pneumococcal Conjugate-13 11/12/2014  . Pneumococcal Polysaccharide-23 06/26/2012  . Td 10/22/2003  . Zoster  09/02/2007    Qualifies for Shingles Vaccine? No, previously had zoster  Screening Tests Health Maintenance  Topic Date Due  . TETANUS/TDAP  02/20/2019 (Originally 10/21/2013)  . INFLUENZA VACCINE  12/07/2018  . COLONOSCOPY  12/15/2019  . MAMMOGRAM  12/28/2019  . DEXA SCAN  Completed  . Hepatitis C Screening  Completed  . PNA vac Low Risk Adult  Completed    Cancer Screenings: Lung: Low Dose CT Chest recommended if Age 15-80 years, 30 pack-year currently smoking OR have quit w/in 15years. Patient does not qualify. Breast:  Up to date on Mammogram? Yes   Up to date of Bone Density/Dexa? Yes Colorectal: Due next year  Additional Screenings:  Hepatitis C Screening: Completed     Plan:    1. Encounter for Medicare annual wellness exam   I have personally reviewed and noted the following in the patient's chart:   . Medical and social history . Use of alcohol, tobacco or illicit drugs  . Current medications and supplements . Functional ability and status . Nutritional status . Physical activity . Advanced directives . List of other physicians . Hospitalizations, surgeries, and ER visits in previous 12 months . Vitals . Screenings to include cognitive, depression, and falls . Referrals and appointments  In addition, I have reviewed and discussed with patient certain preventive protocols, quality metrics, and best practice recommendations. A written personalized care plan for preventive services as well as general preventive health recommendations were provided to patient.    I provided 20 minutes of non-face-to-face time during this encounter.  Freddy Finner, NP  08/23/2018

## 2018-08-23 ENCOUNTER — Other Ambulatory Visit: Payer: Self-pay

## 2018-08-23 ENCOUNTER — Ambulatory Visit (INDEPENDENT_AMBULATORY_CARE_PROVIDER_SITE_OTHER): Payer: Medicare HMO | Admitting: Family Medicine

## 2018-08-23 ENCOUNTER — Encounter: Payer: Self-pay | Admitting: Family Medicine

## 2018-08-23 DIAGNOSIS — Z0000 Encounter for general adult medical examination without abnormal findings: Secondary | ICD-10-CM | POA: Diagnosis not present

## 2018-08-23 NOTE — Patient Instructions (Addendum)
Thank you for completing your annual wellness visit via telephone today.  I appreciate the opportunity to provide you with the care for your health and wellness. Today we discussed: Overall health and wellness.   I have attached some preventative information for your review.  Additionally, I would like to encourage you to take your medicines more regularly.  I appreciate your honesty and that you are not taking them as regularly or as directed.  Please let us know if there is anything that we can do to help support you with this.  WASH YOUR HANDS WELL AND FREQUENTLY. AVOID TOUCHING YOUR FACE, UNLESS YOUR HANDS ARE FRESHLY WASHED. GET FRESH AIR DAILY. STAY HYDRATED WITH WATER.   It was a pleasure to see you and I look forward to continuing to work together on your health and well-being.Please do not hesitate to call the office if you need care or have questions about your care.  Have a wonderful day and week. With Gratitude, Tereasa CoopHannah Mills, DNP, AGNP-BC   [name] , Thank you for taking time to come for your Medicare Wellness Visit. I appreciate your ongoing commitment to your health goals. Please review the following plan we discussed and let me know if I can assist you in the future.   Screening recommendations/referrals: Colonoscopy: Next year 2021-per chart Mammogram: This August Bone Density: Done Recommended yearly ophthalmology/optometry visit for glaucoma screening and checkup Recommended yearly dental visit for hygiene and checkup   Preventive Care 65 Years and Older, Female Preventive care refers to lifestyle choices and visits with your health care provider that can promote health and wellness. What does preventive care include?  A yearly physical exam. This is also called an annual well check.  Dental exams once or twice a year.  Routine eye exams. Ask your health care provider how often you should have your eyes checked.  Personal lifestyle choices, including:   Daily care of your teeth and gums.  Regular physical activity.  Eating a healthy diet.  Avoiding tobacco and drug use.  Limiting alcohol use.  Practicing safe sex.  Taking low-dose aspirin every day.  Taking vitamin and mineral supplements as recommended by your health care provider. What happens during an annual well check? The services and screenings done by your health care provider during your annual well check will depend on your age, overall health, lifestyle risk factors, and family history of disease. Counseling  Your health care provider may ask you questions about your:  Alcohol use.  Tobacco use.  Drug use.  Emotional well-being.  Home and relationship well-being.  Sexual activity.  Eating habits.  History of falls.  Memory and ability to understand (cognition).  Work and work Astronomerenvironment.  Reproductive health. Screening  You may have the following tests or measurements:  Height, weight, and BMI.  Blood pressure.  Lipid and cholesterol levels. These may be checked every 5 years, or more frequently if you are over 72 years old.  Skin check.  Lung cancer screening. You may have this screening every year starting at age 72 if you have a 30-pack-year history of smoking and currently smoke or have quit within the past 15 years.  Fecal occult blood test (FOBT) of the stool. You may have this test every year starting at age 850.  Flexible sigmoidoscopy or colonoscopy. You may have a sigmoidoscopy every 5 years or a colonoscopy every 10 years starting at age 72.  Hepatitis C blood test.  Hepatitis B blood test.  Sexually transmitted disease (STD) testing.  Diabetes screening. This is done by checking your blood sugar (glucose) after you have not eaten for a while (fasting). You may have this done every 1-3 years.  Bone density scan. This is done to screen for osteoporosis. You may have this done starting at age 65.  Mammogram. This may be done  every 1-2 years. Talk to your health care provider about how often you should have regular mammograms. Talk with your health care provider about your test results, treatment options, and if necessary, the need for more tests. Vaccines  Your health care provider may recommend certain vaccines, such as:  Influenza vaccine. This is recommended every year.  Tetanus, diphtheria, and acellular pertussis (Tdap, Td) vaccine. You may need a Td booster every 10 years.  Zoster vaccine. You may need this after age 63.  Pneumococcal 13-valent conjugate (PCV13) vaccine. One dose is recommended after age 70.  Pneumococcal polysaccharide (PPSV23) vaccine. One dose is recommended after age 37. Talk to your health care provider about which screenings and vaccines you need and how often you need them. This information is not intended to replace advice given to you by your health care provider. Make sure you discuss any questions you have with your health care provider. Document Released: 05/21/2015 Document Revised: 01/12/2016 Document Reviewed: 02/23/2015 Elsevier Interactive Patient Education  2017 [employer].  Fall Prevention in the Home Falls can cause injuries. They can happen to people of all ages. There are many things you can do to make your home safe and to help prevent falls. What can I do on the outside of my home?  Regularly fix the edges of walkways and driveways and fix any cracks.  Remove anything that might make you trip as you walk through a door, such as a raised step or threshold.  Trim any bushes or trees on the path to your home.  Use bright outdoor lighting.  Clear any walking paths of anything that might make someone trip, such as rocks or tools.  Regularly check to see if handrails are loose or broken. Make sure that both sides of any steps have handrails.  Any raised decks and porches should have guardrails on the edges.  Have any leaves, snow, or ice cleared regularly.   Use sand or salt on walking paths during winter.  Clean up any spills in your garage right away. This includes oil or grease spills. What can I do in the bathroom?  Use night lights.  Install grab bars by the toilet and in the tub and shower. Do not use towel bars as grab bars.  Use non-skid mats or decals in the tub or shower.  If you need to sit down in the shower, use a plastic, non-slip stool.  Keep the floor dry. Clean up any water that spills on the floor as soon as it happens.  Remove soap buildup in the tub or shower regularly.  Attach bath mats securely with double-sided non-slip rug tape.  Do not have throw rugs and other things on the floor that can make you trip. What can I do in the bedroom?  Use night lights.  Make sure that you have a light by your bed that is easy to reach.  Do not use any sheets or blankets that are too big for your bed. They should not hang down onto the floor.  Have a firm chair that has side arms. You can use this for support while you get  dressed.  Do not have throw rugs and other things on the floor that can make you trip. What can I do in the kitchen?  Clean up any spills right away.  Avoid walking on wet floors.  Keep items that you use a lot in easy-to-reach places.  If you need to reach something above you, use a strong step stool that has a grab bar.  Keep electrical cords out of the way.  Do not use floor polish or wax that makes floors slippery. If you must use wax, use non-skid floor wax.  Do not have throw rugs and other things on the floor that can make you trip. What can I do with my stairs?  Do not leave any items on the stairs.  Make sure that there are handrails on both sides of the stairs and use them. Fix handrails that are broken or loose. Make sure that handrails are as long as the stairways.  Check any carpeting to make sure that it is firmly attached to the stairs. Fix any carpet that is loose or worn.   Avoid having throw rugs at the top or bottom of the stairs. If you do have throw rugs, attach them to the floor with carpet tape.  Make sure that you have a light switch at the top of the stairs and the bottom of the stairs. If you do not have them, ask someone to add them for you. What else can I do to help prevent falls?  Wear shoes that:  Do not have high heels.  Have rubber bottoms.  Are comfortable and fit you well.  Are closed at the toe. Do not wear sandals.  If you use a stepladder:  Make sure that it is fully opened. Do not climb a closed stepladder.  Make sure that both sides of the stepladder are locked into place.  Ask someone to hold it for you, if possible.  Clearly mark and make sure that you can see:  Any grab bars or handrails.  First and last steps.  Where the edge of each step is.  Use tools that help you move around (mobility aids) if they are needed. These include:  Canes.  Walkers.  Scooters.  Crutches.  Turn on the lights when you go into a dark area. Replace any light bulbs as soon as they burn out.  Set up your furniture so you have a clear path. Avoid moving your furniture around.  If any of your floors are uneven, fix them.  If there are any pets around you, be aware of where they are.  Review your medicines with your doctor. Some medicines can make you feel dizzy. This can increase your chance of falling. Ask your doctor what other things that you can do to help prevent falls. This information is not intended to replace advice given to you by your health care provider. Make sure you discuss any questions you have with your health care provider. Document Released: 02/18/2009 Document Revised: 09/30/2015 Document Reviewed: 05/29/2014 Elsevier Interactive Patient Education  2017 [employer].

## 2018-08-26 ENCOUNTER — Ambulatory Visit: Payer: Medicare HMO

## 2018-10-04 ENCOUNTER — Other Ambulatory Visit: Payer: Self-pay

## 2018-10-04 MED ORDER — PANTOPRAZOLE SODIUM 40 MG PO TBEC: 40.0000 mg | DELAYED_RELEASE_TABLET | Freq: Every day | ORAL | 0 refills | Status: DC

## 2018-11-18 ENCOUNTER — Other Ambulatory Visit: Payer: Self-pay

## 2018-11-18 ENCOUNTER — Ambulatory Visit (INDEPENDENT_AMBULATORY_CARE_PROVIDER_SITE_OTHER): Payer: Medicare HMO | Admitting: Family Medicine

## 2018-11-18 VITALS — BP 134/78 | HR 78 | Temp 97.8°F | Ht 61.5 in | Wt 148.0 lb

## 2018-11-18 DIAGNOSIS — E785 Hyperlipidemia, unspecified: Secondary | ICD-10-CM

## 2018-11-18 DIAGNOSIS — M1712 Unilateral primary osteoarthritis, left knee: Secondary | ICD-10-CM

## 2018-11-18 DIAGNOSIS — E663 Overweight: Secondary | ICD-10-CM

## 2018-11-18 DIAGNOSIS — Z1231 Encounter for screening mammogram for malignant neoplasm of breast: Secondary | ICD-10-CM | POA: Diagnosis not present

## 2018-11-18 DIAGNOSIS — I10 Essential (primary) hypertension: Secondary | ICD-10-CM

## 2018-11-18 DIAGNOSIS — K219 Gastro-esophageal reflux disease without esophagitis: Secondary | ICD-10-CM | POA: Diagnosis not present

## 2018-11-18 LAB — LIPID PANEL
Cholesterol: 244 mg/dL — ABNORMAL HIGH (ref <200)
HDL: 51 mg/dL (ref >=50)
LDL Cholesterol (Calc): 172 mg/dL (calc) — ABNORMAL HIGH
Non-HDL Cholesterol (Calc): 193 mg/dL (calc) — ABNORMAL HIGH (ref <130)
Total CHOL/HDL Ratio: 4.8 (calc) (ref <5.0)
Triglycerides: 94 mg/dL (ref <150)

## 2018-11-18 LAB — COMPLETE METABOLIC PANEL WITH GFR
AG Ratio: 1.4 (calc) (ref 1.0–2.5)
ALT: 16 U/L (ref 6–29)
AST: 20 U/L (ref 10–35)
Albumin: 4.4 g/dL (ref 3.6–5.1)
Alkaline phosphatase (APISO): 93 U/L (ref 37–153)
BUN: 23 mg/dL (ref 7–25)
CO2: 30 mmol/L (ref 20–32)
Calcium: 10 mg/dL (ref 8.6–10.4)
Chloride: 103 mmol/L (ref 98–110)
Creat: 0.88 mg/dL (ref 0.60–0.93)
GFR, Est African American: 77 mL/min/{1.73_m2} (ref >=60)
GFR, Est Non African American: 66 mL/min/{1.73_m2} (ref >=60)
Globulin: 3.1 g/dL (calc) (ref 1.9–3.7)
Glucose, Bld: 101 mg/dL — ABNORMAL HIGH (ref 65–99)
Potassium: 4.3 mmol/L (ref 3.5–5.3)
Sodium: 139 mmol/L (ref 135–146)
Total Bilirubin: 0.5 mg/dL (ref 0.2–1.2)
Total Protein: 7.5 g/dL (ref 6.1–8.1)

## 2018-11-18 NOTE — Patient Instructions (Signed)
Annual physical exam 10/03 or after , call if you need me sooner  Mammogram due end August , please schedule for pt      Lipid, cmp and EGFR   today  It is important that you exercise regularly at least 30 minutes 5 times a week. If you develop chest pain, have severe difficulty breathing, or feel very tired, stop exercising immediately and seek medical attention  Think about what you will eat, plan ahead. Choose " clean, green, fresh or frozen" over canned, processed or packaged foods which are more sugary, salty and fatty. 70 to 75% of food eaten should be vegetables and fruit. Three meals at set times with snacks allowed between meals, but they must be fruit or vegetables. Aim to eat over a 12 hour period , example 7 am to 7 pm, and STOP after  your last meal of the day. Drink water,generally about 64 ounces per day, no other drink is as healthy. Fruit juice is best enjoyed in a healthy way, by EATING the fruit.

## 2018-11-19 ENCOUNTER — Encounter: Payer: Self-pay | Admitting: Family Medicine

## 2018-11-19 NOTE — Progress Notes (Signed)
Colleen [name]     MRN: [id number]      DOB: 1946/03/15   HPI [name] is here for follow up and re-evaluation of chronic medical conditions, medication management and review of any available recent lab and radiology data.  Preventive health is updated, specifically  Cancer screening and Immunization.   Questions or concerns regarding consultations or procedures which the PT has had in the interim are  addressed. The PT denies any adverse reactions to current medications since the last visit.  There are no new concerns.  There are no specific complaints   ROS Denies recent fever or chills. Denies sinus pressure, nasal congestion, ear pain or sore throat. Denies chest congestion, productive cough or wheezing. Denies chest pains, palpitations and leg swelling Denies abdominal pain, nausea, vomiting,diarrhea or constipation.   Denies dysuria, frequency, hesitancy or incontinence. Denies joint pain, swelling and limitation in mobility. Denies headaches, seizures, numbness, or tingling. Denies depression, anxiety or insomnia. Denies skin break down or rash.   PE BP 140/80   Pulse 78   Temp 97.8 F (36.6 C)   Ht 5' 1.5" (1.562 m)   Wt 148 lb (67.1 kg)   SpO2 97%   BMI 27.51 kg/m    Patient alert and oriented and in no cardiopulmonary distress.  HEENT: No facial asymmetry, EOMI,   oropharynx pink and moist.  Neck supple no JVD, no mass.  Chest: Clear to auscultation bilaterally.  CVS: S1, S2 no murmurs, no S3.Regular rate.  ABD: Soft non tender.   Ext: No edema  MS: Adequate ROM spine, shoulders, hips and knees.  Skin: Intact, no ulcerations or rash noted.  Psych: Good eye contact, normal affect. Memory intact not anxious or depressed appearing.  CNS: CN 2-12 intact, power,  normal throughout.no focal deficits noted.   Assessment & Plan  Essential hypertension Controlled, no change in medication DASH diet and commitment to daily physical activity for a minimum  of 30 minutes discussed and encouraged, as a part of hypertension management. The importance of attaining a healthy weight is also discussed.  BP/Weight 11/18/2018 05/16/2018 05/13/2018 03/11/2018 02/06/2018 12/17/2017 08/20/2017  Systolic BP 134 124 124 124 100 148 132  Diastolic BP 78 82 78 80 72 66 60  Wt. (Lbs) 148 147 148 149.12 150.08 150.08 151.75  BMI 27.51 27.33 27.51 27.72 27.9 27.45 28.21          Hyperlipidemia LDL goal <100 Hyperlipidemia:Low fat diet discussed and encouraged.   Lipid Panel  Lab Results  Component Value Date   CHOL 244 (H) 11/18/2018   HDL 51 11/18/2018   LDLCALC 172 (H) 11/18/2018   TRIG 94 11/18/2018   CHOLHDL 4.8 11/18/2018  uncontrolled, needs to commit to medication, has not been taking prescribed med     GERD Controlled, no change in medication   Overweight (BMI 25.0-29.9)  Patient re-educated about  the importance of commitment to a  minimum of 150 minutes of exercise per week as able.  The importance of healthy food choices with portion control discussed, as well as eating regularly and within a 12 hour window most days. The need to choose "clean , green" food 50 to 75% of the time is discussed, as well as to make water the primary drink and set a goal of 64 ounces water daily.    Weight /BMI 11/18/2018 05/16/2018 05/13/2018  WEIGHT 148 lb 147 lb 148 lb  HEIGHT 5' 1.5" 5' 1.5" 5' 1.5"  BMI 27.51 kg/m2 27.33  kg/m2 27.51 kg/m2

## 2018-11-20 ENCOUNTER — Other Ambulatory Visit: Payer: Medicare HMO

## 2018-11-20 ENCOUNTER — Other Ambulatory Visit: Payer: Self-pay

## 2018-11-20 ENCOUNTER — Encounter: Payer: Self-pay | Admitting: Family Medicine

## 2018-11-20 DIAGNOSIS — R6889 Other general symptoms and signs: Secondary | ICD-10-CM | POA: Diagnosis not present

## 2018-11-20 DIAGNOSIS — Z20822 Contact with and (suspected) exposure to covid-19: Secondary | ICD-10-CM

## 2018-11-20 DIAGNOSIS — E663 Overweight: Secondary | ICD-10-CM | POA: Insufficient documentation

## 2018-11-20 NOTE — Assessment & Plan Note (Signed)
Hyperlipidemia:Low fat diet discussed and encouraged.   Lipid Panel  Lab Results  Component Value Date   CHOL 244 (H) 11/18/2018   HDL 51 11/18/2018   LDLCALC 172 (H) 11/18/2018   TRIG 94 11/18/2018   CHOLHDL 4.8 11/18/2018  uncontrolled, needs to commit to medication, has not been taking prescribed med

## 2018-11-20 NOTE — Assessment & Plan Note (Addendum)
Controlled, no change in medication DASH diet and commitment to daily physical activity for a minimum of 30 minutes discussed and encouraged, as a part of hypertension management. The importance of attaining a healthy weight is also discussed.  BP/Weight 11/18/2018 05/16/2018 05/13/2018 03/11/2018 02/06/2018 12/17/2017 0/92/3300  Systolic BP 762 263 335 456 256 389 373  Diastolic BP 78 82 78 80 72 66 60  Wt. (Lbs) 148 147 148 149.12 150.08 150.08 151.75  BMI 27.51 27.33 27.51 27.72 27.9 27.45 28.21

## 2018-11-20 NOTE — Assessment & Plan Note (Signed)
  Patient re-educated about  the importance of commitment to a  minimum of 150 minutes of exercise per week as able.  The importance of healthy food choices with portion control discussed, as well as eating regularly and within a 12 hour window most days. The need to choose "clean , green" food 50 to 75% of the time is discussed, as well as to make water the primary drink and set a goal of 64 ounces water daily.    Weight /BMI 11/18/2018 05/16/2018 05/13/2018  WEIGHT 148 lb 147 lb 148 lb  HEIGHT 5' 1.5" 5' 1.5" 5' 1.5"  BMI 27.51 kg/m2 27.33 kg/m2 27.51 kg/m2

## 2018-11-20 NOTE — Assessment & Plan Note (Signed)
Controlled, no change in medication  

## 2018-11-24 LAB — NOVEL CORONAVIRUS, NAA: SARS-CoV-2, NAA: NOT DETECTED

## 2018-12-07 ENCOUNTER — Other Ambulatory Visit: Payer: Self-pay | Admitting: Family Medicine

## 2018-12-07 DIAGNOSIS — E785 Hyperlipidemia, unspecified: Secondary | ICD-10-CM

## 2018-12-07 DIAGNOSIS — I10 Essential (primary) hypertension: Secondary | ICD-10-CM

## 2018-12-30 ENCOUNTER — Other Ambulatory Visit: Payer: Self-pay

## 2018-12-30 ENCOUNTER — Ambulatory Visit (HOSPITAL_COMMUNITY)
Admission: RE | Admit: 2018-12-30 | Discharge: 2018-12-30 | Disposition: A | Discharge status: Home / Self Care | Payer: Medicare HMO | Source: Ambulatory Visit | Attending: Family Medicine | Admitting: Family Medicine

## 2018-12-30 DIAGNOSIS — Z1231 Encounter for screening mammogram for malignant neoplasm of breast: Secondary | ICD-10-CM | POA: Insufficient documentation

## 2019-01-06 ENCOUNTER — Other Ambulatory Visit: Payer: Self-pay

## 2019-01-06 ENCOUNTER — Ambulatory Visit (INDEPENDENT_AMBULATORY_CARE_PROVIDER_SITE_OTHER): Payer: Medicare HMO

## 2019-01-06 DIAGNOSIS — Z23 Encounter for immunization: Secondary | ICD-10-CM | POA: Diagnosis not present

## 2019-02-11 ENCOUNTER — Telehealth: Payer: Self-pay | Admitting: Family Medicine

## 2019-02-11 NOTE — Telephone Encounter (Signed)
Pt is calling she has a dry cough\no fever\head stopped up\clear drainage  Can you call something in

## 2019-02-12 ENCOUNTER — Other Ambulatory Visit: Payer: Self-pay

## 2019-02-12 ENCOUNTER — Ambulatory Visit (INDEPENDENT_AMBULATORY_CARE_PROVIDER_SITE_OTHER): Payer: Medicare HMO | Admitting: Family Medicine

## 2019-02-12 ENCOUNTER — Encounter: Payer: Self-pay | Admitting: Family Medicine

## 2019-02-12 DIAGNOSIS — J301 Allergic rhinitis due to pollen: Secondary | ICD-10-CM | POA: Diagnosis not present

## 2019-02-12 DIAGNOSIS — I10 Essential (primary) hypertension: Secondary | ICD-10-CM | POA: Diagnosis not present

## 2019-02-12 NOTE — Assessment & Plan Note (Signed)
Current flare, continue OTC allergy tab, add flonase and continue robitussin as before Call for worsening symptoms

## 2019-02-12 NOTE — Telephone Encounter (Signed)
Patient scheduled for phone visit today at 1

## 2019-02-12 NOTE — Progress Notes (Signed)
Virtual Visit via Telephone Note  I connected with Colleen [name] on 02/12/19 at  4:15 PM EDT by telephone and verified that I am speaking with the correct person using two identifiers.  Location: Patient: home Provider: office   I discussed the limitations, risks, security and privacy concerns of performing an evaluation and management service by telephone and the availability of in person appointments. I also discussed with the patient that there may be a patient responsible charge related to this service. The patient expressed understanding and agreed to proceed.   History of Present Illness: Stuffy  head and nose , , no ear pain, sore throat or fever or chills Has use OTC allergy tablet, which has helped stuffy head wheezing 2 nights ago, no more, no sputum production , improved in past 3 days Has not   Been using flonase but will start. Tends to have allergy flares this time of the year Denies recent fever or chills. .  Denies abdominal pain, nausea, vomiting,diarrhea or constipation.   Denies dysuria, frequency, hesitancy or incontinence.  Denies current  headaches       Observations/Objective: Good communication with no confusion and intact memory. Alert and oriented x 3 No signs of respiratory distress during speech BP 130/78   Ht 5' 1.5" (1.562 m)   Wt 148 lb (67.1 kg)   BMI 27.51 kg/m  From record   Assessment and Plan: ALLERGIC RHINITIS, SEASONAL Current flare, continue OTC allergy tab, add flonase and continue robitussin as before Call for worsening symptoms  Essential hypertension Controlled, no change in medication     Follow Up Instructions:    I discussed the assessment and treatment plan with the patient. The patient was provided an opportunity to ask questions and all were answered. The patient agreed with the plan and demonstrated an understanding of the instructions.   The patient was advised to call back or seek an in-person evaluation if  the symptoms worsen or if the condition fails to improve as anticipated.  I provided 8 minutes of non-face-to-face time during this encounter.   Tula Nakayama, MD

## 2019-02-12 NOTE — Assessment & Plan Note (Signed)
Controlled, no change in medication  

## 2019-02-12 NOTE — Patient Instructions (Signed)
You are experiencing uncontrolled allergy symptoms, you have no symptoms to suggest infection  Please continue to take the  robitussin and allergy pill you are already taking and start your flonase daily, as prescribed  Call if symptoms worsen please

## 2019-03-11 ENCOUNTER — Other Ambulatory Visit: Payer: Self-pay

## 2019-03-11 ENCOUNTER — Encounter: Payer: Self-pay | Admitting: Family Medicine

## 2019-03-11 ENCOUNTER — Ambulatory Visit (INDEPENDENT_AMBULATORY_CARE_PROVIDER_SITE_OTHER): Payer: Medicare HMO | Admitting: Family Medicine

## 2019-03-11 VITALS — BP 130/78 | HR 65 | Temp 98.7°F | Resp 15 | Ht 61.5 in | Wt 151.0 lb

## 2019-03-11 DIAGNOSIS — I10 Essential (primary) hypertension: Secondary | ICD-10-CM | POA: Diagnosis not present

## 2019-03-11 DIAGNOSIS — E559 Vitamin D deficiency, unspecified: Secondary | ICD-10-CM

## 2019-03-11 DIAGNOSIS — R7301 Impaired fasting glucose: Secondary | ICD-10-CM

## 2019-03-11 DIAGNOSIS — E785 Hyperlipidemia, unspecified: Secondary | ICD-10-CM

## 2019-03-11 DIAGNOSIS — Z0000 Encounter for general adult medical examination without abnormal findings: Secondary | ICD-10-CM

## 2019-03-11 NOTE — Patient Instructions (Addendum)
F/U in 5 months, in office with MD, cal`l if you ned me before  Labs today, lipid, chem7 and EGFR, cBC, TSH and vit D  Need  Shingrix vaccines ( 2) for shingles, these are 2 vaccines pls get at your pharmacy as soon as possible  Increase exercise commitment to 30 mins 5 days per week as our goal   Think about what you will eat, plan ahead. Choose " clean, green, fresh or frozen" over canned, processed or packaged foods which are more sugary, salty and fatty. 70 to 75% of food eaten should be vegetables and fruit. Three meals at set times with snacks allowed between meals, but they must be fruit or vegetables. Aim to eat over a 12 hour period , example 7 am to 7 pm, and STOP after  your last meal of the day. Drink water,generally about 64 ounces per day, no other drink is as healthy. Fruit juice is best enjoyed in a healthy way, by EATING the fruit.  Thanks for choosing [facility], we consider it a privelige to serve you.

## 2019-03-12 ENCOUNTER — Encounter: Payer: Self-pay | Admitting: Family Medicine

## 2019-03-12 ENCOUNTER — Telehealth: Payer: Self-pay

## 2019-03-12 DIAGNOSIS — I10 Essential (primary) hypertension: Secondary | ICD-10-CM

## 2019-03-12 DIAGNOSIS — E785 Hyperlipidemia, unspecified: Secondary | ICD-10-CM

## 2019-03-12 LAB — BASIC METABOLIC PANEL WITH GFR
BUN: 12 mg/dL (ref 7–25)
CO2: 29 mmol/L (ref 20–32)
Calcium: 10 mg/dL (ref 8.6–10.4)
Chloride: 103 mmol/L (ref 98–110)
Creat: 0.85 mg/dL (ref 0.60–0.93)
GFR, Est African American: 79 mL/min/{1.73_m2} (ref >=60)
GFR, Est Non African American: 68 mL/min/{1.73_m2} (ref >=60)
Glucose, Bld: 101 mg/dL — ABNORMAL HIGH (ref 65–99)
Potassium: 3.9 mmol/L (ref 3.5–5.3)
Sodium: 139 mmol/L (ref 135–146)

## 2019-03-12 LAB — CBC
HCT: 36.6 % (ref 35.0–45.0)
Hemoglobin: 12.6 g/dL (ref 11.7–15.5)
MCH: 30.1 pg (ref 27.0–33.0)
MCHC: 34.4 g/dL (ref 32.0–36.0)
MCV: 87.4 fL (ref 80.0–100.0)
MPV: 9.8 fL (ref 7.5–12.5)
Platelets: 232 10*3/uL (ref 140–400)
RBC: 4.19 10*6/uL (ref 3.80–5.10)
RDW: 13 % (ref 11.0–15.0)
WBC: 4.6 10*3/uL (ref 3.8–10.8)

## 2019-03-12 LAB — LIPID PANEL
Cholesterol: 253 mg/dL — ABNORMAL HIGH (ref <200)
HDL: 46 mg/dL — ABNORMAL LOW (ref >=50)
LDL Cholesterol (Calc): 183 mg/dL (calc) — ABNORMAL HIGH
Non-HDL Cholesterol (Calc): 207 mg/dL (calc) — ABNORMAL HIGH (ref <130)
Total CHOL/HDL Ratio: 5.5 (calc) — ABNORMAL HIGH (ref <5.0)
Triglycerides: 114 mg/dL (ref <150)

## 2019-03-12 LAB — VITAMIN D 25 HYDROXY (VIT D DEFICIENCY, FRACTURES): Vit D, 25-Hydroxy: 24 ng/mL — ABNORMAL LOW (ref 30–100)

## 2019-03-12 LAB — TSH: TSH: 1.71 mIU/L (ref 0.40–4.50)

## 2019-03-12 MED ORDER — ROSUVASTATIN CALCIUM 5 MG PO TABS: ORAL_TABLET | ORAL | 1 refills | Status: DC

## 2019-03-12 NOTE — Assessment & Plan Note (Signed)

## 2019-03-12 NOTE — Telephone Encounter (Signed)
-----  Message from Fayrene Helper, MD sent at 03/12/2019  8:36 AM EST ----- pls advise Kidney and thyroid function are normal Vit D is slightly low, needs to start oTC vitamin D3 , 2000 iU once daily Cholesterol is even higher and now her isk of heat disease is above normal range. I recommend a trial of crestor low dose, 5 mg , 4 days per week, Mond, Wed, Friday and Sunday, please send in   #36 per 12 weeks, refill 1) if she agrees.  She Does report muscle cramps with statin, but we discussed this at visit. Needs rept fasting lipid, cmp and eGFR, 1 week before her next visit also please order and mail, thanks

## 2019-03-12 NOTE — Progress Notes (Signed)
    Colleen [name]     MRN: [id number]      DOB: 25-Feb-1946  HPI: Patient is in for annual physical exam. C/o generalized arthritis, requests handicap sticker , but does not qualify . Immunization is reviewed , and  shingrix recommended  PE: BP 130/78   Pulse 65   Temp 98.7 F (37.1 C) (Temporal)   Resp 15   Ht 5' 1.5" (1.562 m)   Wt 151 lb (68.5 kg)   SpO2 98%   BMI 28.07 kg/m   Pleasant  female, alert and oriented x 3, in no cardio-pulmonary distress. Afebrile. HEENT No facial trauma or asymetry. Sinuses non tender.  Extra occullar muscles intact.. External ears normal, . Neck: supple, no adenopathy,JVD or thyromegaly.No bruits.  Chest: Clear to ascultation bilaterally.No crackles or wheezes. Non tender to palpation  Breast: No asymetry,no masses or lumps. No tenderness. No nipple discharge or inversion. No axillary or supraclavicular adenopathy  Cardiovascular system; Heart sounds normal,  S1 and  S2 ,no S3.  No murmur, or thrill. Apical beat not displaced Peripheral pulses normal.  Abdomen: Soft, non tender, no organomegaly or masses. No bruits. Bowel sounds normal. No guarding, tenderness or rebound.   .   Musculoskeletal exam: Full ROM of spine, hips , shoulders and knees. No deformity ,swelling or crepitus noted. No muscle wasting or atrophy.   Neurologic: Cranial nerves 2 to 12 intact. Power, tone ,sensation and reflexes normal throughout. No disturbance in gait. No tremor.  Skin: Intact, no ulceration, erythema , scaling or rash noted. Pigmentation normal throughout  Psych; Normal mood and affect. Judgement and concentration normal   Assessment & Plan:  Annual physical exam Annual exam as documented. Counseling done  re healthy lifestyle involving commitment to 150 minutes exercise per week, heart healthy diet, and attaining healthy weight.The importance of adequate sleep also discussed. Regular seat belt use and home safety, is also  discussed. Changes in health habits are decided on by the patient with goals and time frames  set for achieving them. Immunization and cancer screening needs are specifically addressed at this visit.

## 2019-04-01 ENCOUNTER — Telehealth: Payer: Self-pay

## 2019-04-01 ENCOUNTER — Other Ambulatory Visit: Payer: Self-pay

## 2019-04-01 MED ORDER — ROSUVASTATIN CALCIUM 5 MG PO TABS: ORAL_TABLET | ORAL | 1 refills | Status: DC

## 2019-04-01 NOTE — Telephone Encounter (Signed)
please send cholesterol medication-- Send in to Mail in Medication [facility])

## 2019-04-01 NOTE — Telephone Encounter (Signed)
Refill sent in

## 2019-04-14 ENCOUNTER — Telehealth: Payer: Self-pay

## 2019-04-14 ENCOUNTER — Telehealth: Payer: Self-pay | Admitting: *Deleted

## 2019-04-14 ENCOUNTER — Other Ambulatory Visit: Payer: Self-pay

## 2019-04-14 MED ORDER — LISINOPRIL-HYDROCHLOROTHIAZIDE 20-25 MG PO TABS: 1.0000 | ORAL_TABLET | Freq: Every day | ORAL | 1 refills | Status: DC

## 2019-04-14 NOTE — Telephone Encounter (Signed)
Medication refilled and sent to pharmacy.

## 2019-04-14 NOTE — Telephone Encounter (Signed)
Pt needs her lisinopril sent to mail order pharmacy she is going to be out

## 2019-04-14 NOTE — Telephone Encounter (Signed)
This has been refilled earlier today

## 2019-04-14 NOTE — Telephone Encounter (Signed)
Pt LVM that she needs her BP medication sent in

## 2019-04-15 ENCOUNTER — Other Ambulatory Visit: Payer: Self-pay

## 2019-04-15 MED ORDER — LISINOPRIL-HYDROCHLOROTHIAZIDE 20-25 MG PO TABS: 1.0000 | ORAL_TABLET | Freq: Every day | ORAL | 1 refills | Status: DC

## 2019-04-15 NOTE — Telephone Encounter (Signed)
Pt called back saying this was sent to [facility] and she needs it to go to the mail order pharmacy [facility] is too high

## 2019-04-15 NOTE — Telephone Encounter (Signed)
Had multiple pharmacies listed. Deleted [facility] as requested

## 2019-05-31 ENCOUNTER — Other Ambulatory Visit: Payer: Self-pay | Admitting: Family Medicine

## 2019-05-31 DIAGNOSIS — E785 Hyperlipidemia, unspecified: Secondary | ICD-10-CM

## 2019-05-31 DIAGNOSIS — I10 Essential (primary) hypertension: Secondary | ICD-10-CM

## 2019-06-04 ENCOUNTER — Telehealth: Payer: Self-pay | Admitting: *Deleted

## 2019-06-04 NOTE — Telephone Encounter (Signed)
noted 

## 2019-06-04 NOTE — Telephone Encounter (Signed)
Pt called wanting to let dr simpson know that her and her spouse Sherilyn Cooter both had their covid vaccine today and are scheduled for their second on 2-24

## 2019-07-01 ENCOUNTER — Telehealth: Payer: Self-pay

## 2019-07-01 ENCOUNTER — Other Ambulatory Visit: Payer: Self-pay | Admitting: *Deleted

## 2019-07-01 MED ORDER — PANTOPRAZOLE SODIUM 40 MG PO TBEC: 40.0000 mg | DELAYED_RELEASE_TABLET | Freq: Every day | ORAL | 0 refills | Status: DC

## 2019-07-01 NOTE — Telephone Encounter (Signed)
Refill has been sent to [facility] pharmacy LVM to let pt know

## 2019-07-01 NOTE — Telephone Encounter (Signed)
Please call in Protonix to [facility] thanks --IF you have any questions please call the Pt, if not I advised her to check with the pharmacy

## 2019-07-07 DIAGNOSIS — H401131 Primary open-angle glaucoma, bilateral, mild stage: Secondary | ICD-10-CM | POA: Diagnosis not present

## 2019-07-14 ENCOUNTER — Other Ambulatory Visit: Payer: Self-pay | Admitting: Family Medicine

## 2019-08-04 DIAGNOSIS — E785 Hyperlipidemia, unspecified: Secondary | ICD-10-CM | POA: Diagnosis not present

## 2019-08-04 DIAGNOSIS — I10 Essential (primary) hypertension: Secondary | ICD-10-CM | POA: Diagnosis not present

## 2019-08-04 LAB — COMPLETE METABOLIC PANEL WITH GFR
AG Ratio: 1.5 (calc) (ref 1.0–2.5)
ALT: 13 U/L (ref 6–29)
AST: 20 U/L (ref 10–35)
Albumin: 4.4 g/dL (ref 3.6–5.1)
Alkaline phosphatase (APISO): 95 U/L (ref 37–153)
BUN/Creatinine Ratio: 19 (calc) (ref 6–22)
BUN: 19 mg/dL (ref 7–25)
CO2: 29 mmol/L (ref 20–32)
Calcium: 9.8 mg/dL (ref 8.6–10.4)
Chloride: 106 mmol/L (ref 98–110)
Creat: 1.01 mg/dL — ABNORMAL HIGH (ref 0.60–0.93)
GFR, Est African American: 64 mL/min/{1.73_m2} (ref >=60)
GFR, Est Non African American: 56 mL/min/{1.73_m2} — ABNORMAL LOW (ref >=60)
Globulin: 3 g/dL (calc) (ref 1.9–3.7)
Glucose, Bld: 104 mg/dL — ABNORMAL HIGH (ref 65–99)
Potassium: 3.8 mmol/L (ref 3.5–5.3)
Sodium: 144 mmol/L (ref 135–146)
Total Bilirubin: 0.5 mg/dL (ref 0.2–1.2)
Total Protein: 7.4 g/dL (ref 6.1–8.1)

## 2019-08-04 LAB — LIPID PANEL
Cholesterol: 161 mg/dL (ref <200)
HDL: 48 mg/dL — ABNORMAL LOW (ref >=50)
LDL Cholesterol (Calc): 94 mg/dL (calc)
Non-HDL Cholesterol (Calc): 113 mg/dL (calc) (ref <130)
Total CHOL/HDL Ratio: 3.4 (calc) (ref <5.0)
Triglycerides: 96 mg/dL (ref <150)

## 2019-08-12 ENCOUNTER — Ambulatory Visit: Payer: Medicare HMO | Admitting: Family Medicine

## 2019-10-13 ENCOUNTER — Encounter: Payer: Self-pay | Admitting: Gastroenterology

## 2019-10-15 ENCOUNTER — Other Ambulatory Visit: Payer: Self-pay | Admitting: Family Medicine

## 2019-10-15 DIAGNOSIS — I10 Essential (primary) hypertension: Secondary | ICD-10-CM

## 2019-10-15 DIAGNOSIS — E785 Hyperlipidemia, unspecified: Secondary | ICD-10-CM

## 2019-10-22 ENCOUNTER — Telehealth: Payer: Self-pay

## 2019-10-22 NOTE — Telephone Encounter (Signed)
Spoke with pharmacist. OK to fill statin.

## 2019-10-22 NOTE — Telephone Encounter (Signed)
Please call the pharmacy they are saying the pt has a statin allergy--and needs  To talk to someone

## 2019-10-24 ENCOUNTER — Telehealth: Payer: Self-pay

## 2019-10-24 NOTE — Telephone Encounter (Signed)
Pt needs rosuvastatin (CRESTOR) 5 MG tablet called in

## 2019-10-24 NOTE — Telephone Encounter (Signed)
Medication was refilled and sent in to mail order pharmacy.

## 2019-10-24 NOTE — Telephone Encounter (Signed)
Patient aware that med was refilled.

## 2019-10-29 ENCOUNTER — Encounter: Payer: Self-pay | Admitting: Internal Medicine

## 2019-11-17 ENCOUNTER — Other Ambulatory Visit: Payer: Self-pay

## 2019-11-17 ENCOUNTER — Telehealth: Payer: Self-pay | Admitting: Family Medicine

## 2019-11-17 DIAGNOSIS — E559 Vitamin D deficiency, unspecified: Secondary | ICD-10-CM

## 2019-11-17 DIAGNOSIS — E785 Hyperlipidemia, unspecified: Secondary | ICD-10-CM

## 2019-11-17 DIAGNOSIS — E663 Overweight: Secondary | ICD-10-CM

## 2019-11-17 DIAGNOSIS — Z1231 Encounter for screening mammogram for malignant neoplasm of breast: Secondary | ICD-10-CM

## 2019-11-17 DIAGNOSIS — I10 Essential (primary) hypertension: Secondary | ICD-10-CM

## 2019-11-17 NOTE — Telephone Encounter (Signed)
Please advise regarding labs. Mammogram has been ordered and scheduled and patient is aware of appointment.

## 2019-11-17 NOTE — Telephone Encounter (Signed)
Pt needs mammogram order and is wanting to know if any labs need to be completed before her physical Please advise thank you

## 2019-11-17 NOTE — Telephone Encounter (Signed)
Fasting CBC lipid CMP and EGFR TSH and vitamin D.needed prior to physical

## 2019-11-17 NOTE — Telephone Encounter (Signed)
Patient aware of labs that need to be drawn

## 2019-12-05 ENCOUNTER — Other Ambulatory Visit: Payer: Self-pay | Admitting: Family Medicine

## 2020-01-01 ENCOUNTER — Ambulatory Visit (HOSPITAL_COMMUNITY)
Admission: RE | Admit: 2020-01-01 | Discharge: 2020-01-01 | Disposition: A | Discharge status: Home / Self Care | Payer: Medicare HMO | Source: Ambulatory Visit | Attending: Family Medicine | Admitting: Family Medicine

## 2020-01-01 ENCOUNTER — Other Ambulatory Visit: Payer: Self-pay

## 2020-01-01 DIAGNOSIS — Z1231 Encounter for screening mammogram for malignant neoplasm of breast: Secondary | ICD-10-CM | POA: Diagnosis not present

## 2020-01-05 ENCOUNTER — Ambulatory Visit (HOSPITAL_COMMUNITY): Payer: Medicare HMO

## 2020-01-06 ENCOUNTER — Ambulatory Visit: Payer: Medicare HMO

## 2020-01-07 DIAGNOSIS — Z0100 Encounter for examination of eyes and vision without abnormal findings: Secondary | ICD-10-CM | POA: Diagnosis not present

## 2020-01-07 DIAGNOSIS — H401131 Primary open-angle glaucoma, bilateral, mild stage: Secondary | ICD-10-CM | POA: Diagnosis not present

## 2020-01-16 ENCOUNTER — Other Ambulatory Visit: Payer: Self-pay

## 2020-01-16 ENCOUNTER — Ambulatory Visit (INDEPENDENT_AMBULATORY_CARE_PROVIDER_SITE_OTHER): Payer: Medicare HMO

## 2020-01-16 DIAGNOSIS — Z23 Encounter for immunization: Secondary | ICD-10-CM

## 2020-01-16 DIAGNOSIS — Z008 Encounter for other general examination: Secondary | ICD-10-CM | POA: Diagnosis not present

## 2020-01-16 DIAGNOSIS — K219 Gastro-esophageal reflux disease without esophagitis: Secondary | ICD-10-CM | POA: Diagnosis not present

## 2020-01-16 DIAGNOSIS — M1990 Unspecified osteoarthritis, unspecified site: Secondary | ICD-10-CM | POA: Diagnosis not present

## 2020-01-16 DIAGNOSIS — Z7722 Contact with and (suspected) exposure to environmental tobacco smoke (acute) (chronic): Secondary | ICD-10-CM | POA: Diagnosis not present

## 2020-01-16 DIAGNOSIS — I10 Essential (primary) hypertension: Secondary | ICD-10-CM | POA: Diagnosis not present

## 2020-01-16 DIAGNOSIS — K08109 Complete loss of teeth, unspecified cause, unspecified class: Secondary | ICD-10-CM | POA: Diagnosis not present

## 2020-01-16 DIAGNOSIS — R32 Unspecified urinary incontinence: Secondary | ICD-10-CM | POA: Diagnosis not present

## 2020-01-16 DIAGNOSIS — E785 Hyperlipidemia, unspecified: Secondary | ICD-10-CM | POA: Diagnosis not present

## 2020-01-16 DIAGNOSIS — H409 Unspecified glaucoma: Secondary | ICD-10-CM | POA: Diagnosis not present

## 2020-01-16 DIAGNOSIS — K5900 Constipation, unspecified: Secondary | ICD-10-CM | POA: Diagnosis not present

## 2020-01-16 DIAGNOSIS — Z803 Family history of malignant neoplasm of breast: Secondary | ICD-10-CM | POA: Diagnosis not present

## 2020-02-07 ENCOUNTER — Other Ambulatory Visit: Payer: Self-pay | Admitting: Family Medicine

## 2020-02-18 ENCOUNTER — Other Ambulatory Visit: Payer: Self-pay

## 2020-02-18 ENCOUNTER — Telehealth: Payer: Self-pay | Admitting: Family Medicine

## 2020-02-18 ENCOUNTER — Ambulatory Visit (INDEPENDENT_AMBULATORY_CARE_PROVIDER_SITE_OTHER): Payer: Self-pay | Admitting: *Deleted

## 2020-02-18 VITALS — Ht 61.5 in | Wt 140.6 lb

## 2020-02-18 DIAGNOSIS — Z1211 Encounter for screening for malignant neoplasm of colon: Secondary | ICD-10-CM

## 2020-02-18 NOTE — Telephone Encounter (Signed)
Pt advised she did not have to be seen before colonoscopy.

## 2020-02-18 NOTE — Progress Notes (Signed)
Ok to schedule.  ASA II 

## 2020-02-18 NOTE — Progress Notes (Signed)
Gastroenterology Pre-Procedure Review  Request Date: 02/18/2020 Requesting Physician: 10 year recall, Last TCS done 12/10/2009 by Dr. Darrick Penna, no polyps  PATIENT REVIEW QUESTIONS: The patient responded to the following health history questions as indicated:    1. Diabetes Melitis: no 2. Joint replacements in the past 12 months: no 3. Major health problems in the past 3 months: no 4. Has an artificial valve or MVP: no 5. Has a defibrillator: no 6. Has been advised in past to take antibiotics in advance of a procedure like teeth cleaning: no 7. Family history of colon cancer: no  8. Alcohol Use: no  9. Illicit drug Use: no 10. History of sleep apnea: no 11. History of coronary artery or other vascular stents placed within the last 12 months: no 12. History of any prior anesthesia complications: no 13. There is no height or weight on file to calculate BMI.    MEDICATIONS & ALLERGIES:    Patient reports the following regarding taking any blood thinners:   Plavix? no Aspirin? no Coumadin? no Brilinta? no Xarelto? no Eliquis? no Pradaxa? no Savaysa? no Effient? no  Patient confirms/reports the following medications:  Current Outpatient Medications  Medication Sig Dispense Refill  . acetaminophen (TYLENOL) 650 MG CR tablet Take 650 mg by mouth as needed for pain.     Marland Kitchen alendronate (FOSAMAX) 70 MG tablet TAKE 1 TABLET EVERY 7 DAYS TAKE WITH A FULL GLASS OF  WATER ON AN EMPTY STOMACH 12 tablet 1  . Artificial Tear Solution (SOOTHE XP) SOLN Apply to eye 3 (three) times daily.    . Ascorbic Acid (VITAMIN C) 1000 MG tablet Take 1,000 mg by mouth as needed.     . Calcium Carb-Cholecalciferol (CALTRATE 600+D) 600-800 MG-UNIT TABS daily.  120 tablet 11  . Cholecalciferol (VITAMIN D3) 125 MCG (5000 UT) CAPS Take by mouth daily.    Marland Kitchen co-enzyme Q-10 30 MG capsule Take 1 capsule (30 mg total) by mouth daily. 90 capsule 3  . diphenhydrAMINE (BENADRYL) 25 MG tablet Take 25 mg by mouth as needed.     . latanoprost (XALATAN) 0.005 % ophthalmic solution Place 1 drop into both eyes at bedtime.  12  . lisinopril-hydrochlorothiazide (ZESTORETIC) 20-25 MG tablet Take 1 tablet by mouth daily. 90 tablet 1  . Multiple Vitamin (MULTIVITAMIN) tablet Take 1 tablet by mouth daily.    . pantoprazole (PROTONIX) 40 MG tablet Take 1 tablet by mouth once daily 90 tablet 0  . rosuvastatin (CRESTOR) 5 MG tablet TAKE 1 TABLET EVERY MONDAY,WEDNESDAY, FRIDAY AND      SUNDAY 38 tablet 1   No current facility-administered medications for this visit.    Patient confirms/reports the following allergies:  Allergies  Allergen Reactions  . Aspirin     REACTION: nausea  . Fenofibrate     Muscle aches  . Statins Other (See Comments)    Muscle cramps    No orders of the defined types were placed in this encounter.   AUTHORIZATION INFORMATION Primary Insurance: Orpah Clinton,  ID #: [id number],  Group #: [id number] Pre-Cert / Auth required: No, not required  SCHEDULE INFORMATION: Procedure has been scheduled as follows:  Date: 02/27/2020, Time: 10:30 Location: [facility] with Dr. Marletta Lor  This Gastroenterology Pre-Precedure Review Form is being routed to the following provider(s): Wynne Dust, NP

## 2020-02-18 NOTE — Patient Instructions (Signed)
[facility]   Please notify us immediately if you are diabetic, take iron supplements, or if you are on coumadin or any blood thinners.   Patient Name: Colleen [name] Date of procedure: 02/27/2020 Time to register at [place] Stay: 9:00 am Provider: Dr. Abbey Chatters   Purchase: MIRALAX 238 gram bottle, 1 FLEET ENEMA, 1 box of DULCOLAX (All over the counter medications)    02/25/2020- 2 Days prior to procedure: START CLEAR LIQUID DIET AFTER YOUR LUNCH MEAL--NO SOLID FOODS!   02/26/2020- 1 Day prior to procedure:   CLEAR LIQUIDS ALL DAY--NO SOLID FOODS!   Diabetic medication adjustments for today:    At 10:00 AM, take 2 DULCOLAX $RemoveBe'5mg'soJdGgpiH$  tablets   At 12:00 PM, Mix 5 teaspoons of Miralax in any 4-6 ounces of CLEAR LIQUIDS (Gatorade) every hour for 5 hours until passing clear, watery stools. Be sure to drink 4 ounces of clear liquid 30 minutes after each dose of Miralax.   At 3:00 PM, take 2 Dulcolax $RemoveBe'5mg'aPSMjcmuK$  tablets   If stools are not clear & watery by 6:00 PM, take 5 teaspoons of Miralax every 30 minutes until stools are clear (no color)   You must have a complete prep to ensure the most effective cleaning.   CONTINUE CLEAR LIQUIDS ONLY UNTIL MIDNIGHT. Make a conscious effort to drink as much as you can before, during & after the preparation.    NOTHING TO EAT OR DRINK AFTER MIDNIGHT except for your heart, blood pressure & breathing medications. You may take them with a sip of clear liquids.    02/27/2020- Day of Procedure  Give yourself one Fleet enema about 1 hour prior to leaving for the hospital.   Diabetic medication adjustments for today:   You may take TYLENOL products. Please continue your regular medications unless we have instructed you otherwise.    Please note, on the day of your procedure you MUST be accompanied by an adult who is willing to assume responsibility for you at time of discharge. If you do not have such person with you, your  procedure will have to be rescheduled.                                                             Please leave ALL jewelry at home prior to coming to the hospital for your procedure.   *It is your responsibility to check with your insurance company for the benefits of coverage you have for this procedure. Unfortunately, not all insurance companies have benefits to cover all or part of these types of procedures. It is your responsibility to check your benefits, however we will be glad to assist you with any codes your insurance company may need.   Please note that most insurance companies will not cover a screening colonoscopy for people under the age of 64. For example, with some insurance companies you may have benefits for a screening colonoscopy, but if polyps are found the diagnosis will change and then you may have a deductible that will need to be met. Please make sure you check your benefits for screening colonoscopy as well as a diagnostic colonoscopy.    CLEAR LIQUIDS: (NO RED)  Jello Apple Juice White Grape Juice Water  Banana popsicles Kool-Aid Coffee(No cream or milk)  Tea (No cream or milk) Soft drinks Broth (fat free beef/chicken/vegetable)   Clear liquids allow you to see your fingers on the other side of the glass. Be sure they are NOT RED in color, cloudy, but CLEAR.   Do Not Eat:  Dairy products of any kind Cranberry juice  Tomato or V8 Juice Orange Juice  Grapefruit Juice Red Grape Juice  Solid foods like cereal, oatmeal, yogurt, fruits, vegetables, creamed soups, eggs, bread, etc   HELPFUL HINTS TO MAKE DRINKING EASIER:  -Make sure prep is extremely COLD. Refrigerate the night before. You may also put in freezer.  -You may try mixing Crystal Light or Country Time Lemonade if you prefer. MIx in small amounts. Add more if necessary.  -Trying drinking through a straw.  -Rinse mouth with water or mouthwash  between glasses to remove aftertaste.  -Try sipping on a cold beverage/ice popsicles between glasses of prep.  -Place a piece of sugar-free hard candy in mouth between glasses.  -If you become nauseated, try consuming smaller amounts or stretch out the time between glasses. Stop for 30 minutes to an hour & slowly start back drinking.   Call our office with any questions or concerns at [phone].   Thank You

## 2020-02-18 NOTE — Telephone Encounter (Signed)
Patient scheduled for a colonoscopy Friday 02/27/20 and wanted to know if she needs to see Dr.Simpson before she has that done p# [phone]

## 2020-02-25 ENCOUNTER — Other Ambulatory Visit: Payer: Self-pay

## 2020-02-25 ENCOUNTER — Other Ambulatory Visit (HOSPITAL_COMMUNITY)
Admission: RE | Admit: 2020-02-25 | Discharge: 2020-02-25 | Disposition: A | Discharge status: Home / Self Care | Payer: Medicare HMO | Source: Ambulatory Visit | Attending: Internal Medicine | Admitting: Internal Medicine

## 2020-02-25 DIAGNOSIS — Z20822 Contact with and (suspected) exposure to covid-19: Secondary | ICD-10-CM | POA: Diagnosis not present

## 2020-02-25 DIAGNOSIS — Z01812 Encounter for preprocedural laboratory examination: Secondary | ICD-10-CM | POA: Diagnosis not present

## 2020-02-26 LAB — SARS CORONAVIRUS 2 (TAT 6-24 HRS): SARS Coronavirus 2: NEGATIVE

## 2020-02-27 ENCOUNTER — Encounter (HOSPITAL_COMMUNITY)
Admission: RE | Disposition: A | Discharge status: Home / Self Care | Payer: Self-pay | Source: Home / Self Care | Attending: Internal Medicine

## 2020-02-27 ENCOUNTER — Other Ambulatory Visit: Payer: Self-pay

## 2020-02-27 ENCOUNTER — Ambulatory Visit (HOSPITAL_COMMUNITY)
Admission: RE | Admit: 2020-02-27 | Discharge: 2020-02-27 | Disposition: A | Discharge status: Home / Self Care | Payer: Medicare HMO | Attending: Internal Medicine | Admitting: Internal Medicine

## 2020-02-27 ENCOUNTER — Ambulatory Visit (HOSPITAL_COMMUNITY): Payer: Medicare HMO | Admitting: Anesthesiology

## 2020-02-27 ENCOUNTER — Encounter (HOSPITAL_COMMUNITY): Payer: Self-pay

## 2020-02-27 DIAGNOSIS — K219 Gastro-esophageal reflux disease without esophagitis: Secondary | ICD-10-CM | POA: Diagnosis not present

## 2020-02-27 DIAGNOSIS — H409 Unspecified glaucoma: Secondary | ICD-10-CM | POA: Insufficient documentation

## 2020-02-27 DIAGNOSIS — I10 Essential (primary) hypertension: Secondary | ICD-10-CM | POA: Insufficient documentation

## 2020-02-27 DIAGNOSIS — Z87891 Personal history of nicotine dependence: Secondary | ICD-10-CM | POA: Insufficient documentation

## 2020-02-27 DIAGNOSIS — E785 Hyperlipidemia, unspecified: Secondary | ICD-10-CM | POA: Diagnosis not present

## 2020-02-27 DIAGNOSIS — Z1211 Encounter for screening for malignant neoplasm of colon: Secondary | ICD-10-CM | POA: Diagnosis not present

## 2020-02-27 DIAGNOSIS — Z79899 Other long term (current) drug therapy: Secondary | ICD-10-CM | POA: Insufficient documentation

## 2020-02-27 DIAGNOSIS — Z7983 Long term (current) use of bisphosphonates: Secondary | ICD-10-CM | POA: Insufficient documentation

## 2020-02-27 DIAGNOSIS — K5730 Diverticulosis of large intestine without perforation or abscess without bleeding: Secondary | ICD-10-CM | POA: Diagnosis not present

## 2020-02-27 DIAGNOSIS — K648 Other hemorrhoids: Secondary | ICD-10-CM | POA: Insufficient documentation

## 2020-02-27 HISTORY — PX: COLONOSCOPY WITH PROPOFOL: SHX5780

## 2020-02-27 SURGERY — COLONOSCOPY WITH PROPOFOL
Anesthesia: General

## 2020-02-27 MED ORDER — LACTATED RINGERS IV SOLN
INTRAVENOUS | Status: DC
  Administered 2020-02-27: 1000 mL via INTRAVENOUS

## 2020-02-27 MED ORDER — CHLORHEXIDINE GLUCONATE CLOTH 2 % EX PADS: 6.0000 | MEDICATED_PAD | Freq: Once | CUTANEOUS | Status: DC

## 2020-02-27 MED ORDER — STERILE WATER FOR IRRIGATION IR SOLN
Status: DC | PRN
  Administered 2020-02-27: 1.5 mL

## 2020-02-27 MED ORDER — PROPOFOL 10 MG/ML IV BOLUS
INTRAVENOUS | Status: DC | PRN
  Administered 2020-02-27: 125 ug/kg/min via INTRAVENOUS
  Administered 2020-02-27: 80 mg via INTRAVENOUS

## 2020-02-27 NOTE — Anesthesia Postprocedure Evaluation (Signed)
Anesthesia Post Note  Patient: Colleen [name]  Procedure(s) Performed: COLONOSCOPY WITH PROPOFOL (N/A )  Patient location during evaluation: Endoscopy Anesthesia Type: General Level of consciousness: awake, oriented and awake and alert Pain management: pain level controlled Vital Signs Assessment: post-procedure vital signs reviewed and stable Respiratory status: spontaneous breathing, respiratory function stable and nonlabored ventilation Cardiovascular status: blood pressure returned to baseline and stable Postop Assessment: no headache and no backache Anesthetic complications: no   No complications documented.   Last Vitals:  Vitals:   02/27/20 0925  BP: (!) 138/59  Pulse: 74  Resp: 16  Temp: 36.7 C  SpO2: 98%    Last Pain:  Vitals:   02/27/20 0925  TempSrc: Oral  PainSc: 0-No pain                 Brynda Peon

## 2020-02-27 NOTE — Transfer of Care (Signed)
Immediate Anesthesia Transfer of Care Note  Patient: Colleen [name]  Procedure(s) Performed: COLONOSCOPY WITH PROPOFOL (N/A )  Patient Location: Endoscopy Unit  Anesthesia Type:General  Level of Consciousness: awake, alert , oriented and patient cooperative  Airway & Oxygen Therapy: Patient Spontanous Breathing  Post-op Assessment: Report given to RN, Post -op Vital signs reviewed and stable and Patient moving all extremities  Post vital signs: Reviewed and stable  Last Vitals:  Vitals Value Taken Time  BP    Temp    Pulse    Resp    SpO2      Last Pain:  Vitals:   02/27/20 0925  TempSrc: Oral  PainSc: 0-No pain      Patients Stated Pain Goal: 8 (02/27/20 0925)  Complications: No complications documented.

## 2020-02-27 NOTE — H&P (Signed)
Primary Care Physician:  Kerri Perches, MD Primary Gastroenterologist:  Dr. Marletta Lor  Pre-Procedure History & Physical: HPI:  Colleen [name] is a 74 y.o. female is here for a screening colonoscopy.   Past Medical History:  Diagnosis Date  . GERD (gastroesophageal reflux disease)   . Glaucoma   . Hyperlipidemia   . Hypertension     Past Surgical History:  Procedure Laterality Date  . CHOLECYSTECTOMY    . CHONDROPLASTY Left 12/11/2014   Procedure: CHONDROPLASTY LEFT MEDIAL FEMORAL CONDYLE;  Surgeon: Vickki Hearing, MD;  Location: [facility];  Service: Orthopedics;  Laterality: Left;  . KNEE ARTHROSCOPY WITH LATERAL MENISECTOMY Left 12/11/2014   Procedure: KNEE ARTHROSCOPY WITH LATERAL AND MEDIAL MENISECTOMY;  Surgeon: Vickki Hearing, MD;  Location: [facility];  Service: Orthopedics;  Laterality: Left;    Prior to Admission medications   Medication Sig Start Date End Date Taking? Authorizing Provider  acetaminophen (TYLENOL) 650 MG CR tablet Take 650 mg by mouth every 8 (eight) hours as needed for pain.    Yes [provider]  alendronate (FOSAMAX) 70 MG tablet TAKE 1 TABLET EVERY 7 DAYS TAKE WITH A FULL GLASS OF  WATER ON AN EMPTY STOMACH Patient taking differently: Take 70 mg by mouth every Saturday. With a full glass of water & on an empty stomach 10/15/19  Yes Kerri Perches, MD  Artificial Tear Solution (SOOTHE XP) SOLN Place 1 application into both eyes 3 (three) times daily.    Yes [provider]  Ascorbic Acid (VITAMIN C) 1000 MG tablet Take 1,000 mg by mouth 2 (two) times a week.    Yes [provider]  Calcium Carb-Cholecalciferol (CALTRATE 600+D) 600-800 MG-UNIT TABS Take 1 tablet by mouth daily.  01/15/17  Yes Kerri Perches, MD  Cholecalciferol (VITAMIN D3) 125 MCG (5000 UT) CAPS Take 5,000 Units by mouth daily.    Yes [provider]  Coenzyme Q10 (COQ10) 200 MG CAPS Take 200 mg by mouth daily.   Yes [provider]   latanoprost (XALATAN) 0.005 % ophthalmic solution Place 1 drop into both eyes at bedtime. 01/15/18  Yes [provider]  lisinopril-hydrochlorothiazide (ZESTORETIC) 20-25 MG tablet Take 1 tablet by mouth daily. 04/15/19  Yes Kerri Perches, MD  Multiple Vitamin (MULTIVITAMIN WITH MINERALS) TABS tablet Take 1 tablet by mouth daily.   Yes [provider]  pantoprazole (PROTONIX) 40 MG tablet Take 1 tablet by mouth once daily 12/05/19  Yes Kerri Perches, MD  rosuvastatin (CRESTOR) 5 MG tablet TAKE 1 TABLET EVERY MONDAY,WEDNESDAY, FRIDAY AND      SUNDAY Patient taking differently: Take 5 mg by mouth See admin instructions. Take 1 tablet (5 mg) by mouth on Mondays, Wednesdays, Fridays, & Sundays in the morning. 02/09/20  Yes Kerri Perches, MD  diphenhydrAMINE (BENADRYL) 25 MG tablet Take 25 mg by mouth daily as needed for allergies.     [provider]    Allergies as of 02/18/2020 - Review Complete 02/18/2020  Allergen Reaction Noted  . Aspirin  07/29/2008  . Fenofibrate  01/07/2016  . Statins Other (See Comments) 10/13/2013    Family History  Problem Relation Age of Onset  . Hypertension Mother   . Heart disease Mother 29       MI  . Hypertension Father   . Diabetes Father   . Stroke Father   . Hypertension Sister   . Diabetes Sister   . Multiple sclerosis Sister   .  Hypertension Sister   . Diabetes Sister   . Heart disease Sister   . Kidney disease Sister   . Hypertension Brother   . Schizophrenia Brother 55    Social History   Socioeconomic History  . Marital status: Married    Spouse name: Not on file  . Number of children: Not on file  . Years of education: Not on file  . Highest education level: 12th grade  Occupational History  . Not on file  Tobacco Use  . Smoking status: Former Games developer  . Smokeless tobacco: Never Used  Vaping Use  . Vaping Use: Never used  Substance and Sexual Activity  . Alcohol use: No  . Drug use: No   . Sexual activity: Never  Other Topics Concern  . Not on file  Social History Narrative  . Not on file   Social Determinants of Health   Financial Resource Strain:   . Difficulty of Paying Living Expenses: Not on file  Food Insecurity:   . Worried About Programme researcher, broadcasting/film/video in the Last Year: Not on file  . Ran Out of Food in the Last Year: Not on file  Transportation Needs:   . Lack of Transportation (Medical): Not on file  . Lack of Transportation (Non-Medical): Not on file  Physical Activity:   . Days of Exercise per Week: Not on file  . Minutes of Exercise per Session: Not on file  Stress:   . Feeling of Stress : Not on file  Social Connections:   . Frequency of Communication with Friends and Family: Not on file  . Frequency of Social Gatherings with Friends and Family: Not on file  . Attends Religious Services: Not on file  . Active Member of Clubs or Organizations: Not on file  . Attends Banker Meetings: Not on file  . Marital Status: Not on file  Intimate Partner Violence:   . Fear of Current or Ex-Partner: Not on file  . Emotionally Abused: Not on file  . Physically Abused: Not on file  . Sexually Abused: Not on file    Review of Systems: See HPI, otherwise negative ROS  Impression/Plan: Colleen [name] is here for a colonoscopy to be performed for screening  Risks, benefits, limitations, imponderables and alternatives regarding colonoscopy have been reviewed with the patient. Questions have been answered. All parties agreeable.

## 2020-02-27 NOTE — Op Note (Signed)
[facility] Patient Name: Colleen [name] Procedure Date: 02/27/2020 10:12 AM MRN: [id number] Date of Birth: 04-16-1946 Attending MD: Elon Alas. Abbey Chatters DO CSN: [id number] Age: 74 Admit Type: Outpatient Procedure:                Colonoscopy Indications:              Screening for colorectal malignant neoplasm Providers:                Elon Alas. Abbey Chatters, DO, Caprice Kluver, Casimer Bilis, Technician Referring MD:              Medicines:                See the Anesthesia note for documentation of the                            administered medications Complications:            No immediate complications. Estimated Blood Loss:     Estimated blood loss: none. Procedure:                Pre-Anesthesia Assessment:                           - The anesthesia plan was to use monitored                            anesthesia care (MAC).                           After obtaining informed consent, the colonoscope                            was passed under direct vision. Throughout the                            procedure, the patient's blood pressure, pulse, and                            oxygen saturations were monitored continuously. The                            [id number]-HQ190L ([id number]) scope was introduced through                            the anus and advanced to the the cecum, identified                            by appendiceal orifice and ileocecal valve. The                            colonoscopy was performed without difficulty. The                            patient tolerated the procedure well. The quality  of the bowel preparation was evaluated using the                            BBPS [facility] Bowel Preparation Scale) with scores                            of: Right Colon = 2 (minor amount of residual                            staining, small fragments of stool and/or opaque                            liquid, but mucosa seen  well), Transverse Colon = 2                            (minor amount of residual staining, small fragments                            of stool and/or opaque liquid, but mucosa seen                            well) and Left Colon = 2 (minor amount of residual                            staining, small fragments of stool and/or opaque                            liquid, but mucosa seen well). The total BBPS score                            equals 6. The quality of the bowel preparation was                            fair. Scope In: 10:27:06 AM Scope Out: 10:38:26 AM Scope Withdrawal Time: 0 hours 7 minutes 1 second  Total Procedure Duration: 0 hours 11 minutes 20 seconds  Findings:      The perianal and digital rectal examinations were normal.      Non-bleeding internal hemorrhoids were found during endoscopy.      A few small-mouthed diverticula were found in the sigmoid colon.      The exam was otherwise without abnormality. Impression:               - Preparation of the colon was fair.                           - Non-bleeding internal hemorrhoids.                           - Diverticulosis in the sigmoid colon.                           - The examination was otherwise normal.                           -  No specimens collected. Moderate Sedation:      Per Anesthesia Care Recommendation:           - Patient has a contact number available for                            emergencies. The signs and symptoms of potential                            delayed complications were discussed with the                            patient. Return to normal activities tomorrow.                            Written discharge instructions were provided to the                            patient.                           - Resume previous diet.                           - Continue present medications.                           - Repeat colonoscopy in 10 years for screening                             purposes.                           - Return to GI clinic PRN. Procedure Code(s):        --- Professional ---                           C5885, Colorectal cancer screening; colonoscopy on                            individual not meeting criteria for high risk Diagnosis Code(s):        --- Professional ---                           Z12.11, Encounter for screening for malignant                            neoplasm of colon                           K64.8, Other hemorrhoids                           K57.30, Diverticulosis of large intestine without                            perforation or abscess without bleeding CPT copyright 2019 American Medical Association.  All rights reserved. The codes documented in this report are preliminary and upon coder review may  be revised to meet current compliance requirements. Elon Alas. Abbey Chatters, DO [place] Abbey Chatters, DO 02/27/2020 10:42:06 AM This report has been signed electronically. Number of Addenda: 0

## 2020-02-27 NOTE — Discharge Instructions (Addendum)
Colonoscopy Discharge Instructions  Read the instructions outlined below and refer to this sheet in the next few weeks. These discharge instructions provide you with general information on caring for yourself after you leave the hospital. Your doctor may also give you specific instructions. While your treatment has been planned according to the most current medical practices available, unavoidable complications occasionally occur.   ACTIVITY  You may resume your regular activity, but move at a slower pace for the next 24 hours.   Take frequent rest periods for the next 24 hours.   Walking will help get rid of the air and reduce the bloated feeling in your belly (abdomen).   No driving for 24 hours (because of the medicine (anesthesia) used during the test).    Do not sign any important legal documents or operate any machinery for 24 hours (because of the anesthesia used during the test).  NUTRITION  Drink plenty of fluids.   You may resume your normal diet as instructed by your doctor.   Begin with a light meal and progress to your normal diet. Heavy or fried foods are harder to digest and may make you feel sick to your stomach (nauseated).   Avoid alcoholic beverages for 24 hours or as instructed.  MEDICATIONS  You may resume your normal medications unless your doctor tells you otherwise.  WHAT YOU CAN EXPECT TODAY  Some feelings of bloating in the abdomen.   Passage of more gas than usual.   Spotting of blood in your stool or on the toilet paper.  IF YOU HAD POLYPS REMOVED DURING THE COLONOSCOPY:  No aspirin products for 7 days or as instructed.   No alcohol for 7 days or as instructed.   Eat a soft diet for the next 24 hours.  FINDING OUT THE RESULTS OF YOUR TEST Not all test results are available during your visit. If your test results are not back during the visit, make an appointment with your caregiver to find out the results. Do not assume everything is normal if  you have not heard from your caregiver or the medical facility. It is important for you to follow up on all of your test results.  SEEK IMMEDIATE MEDICAL ATTENTION IF:  You have more than a spotting of blood in your stool.   Your belly is swollen (abdominal distention).   You are nauseated or vomiting.   You have a temperature over 101.   You have abdominal pain or discomfort that is severe or gets worse throughout the day.   Your colonoscopy was relatively unremarkable.  I did not find any polyps. You do have diverticulosis and internal hemorrhoids. I would recommend increasing fiber in your diet or adding OTC Benefiber/Metamucil. Be sure to drink at least 4 to 6 glasses of water daily. Follow-up with GI as needed.   I hope you have a great rest of your week!  Hennie Duosharles K. Marletta Lorarver, D.O. Gastroenterology and Hepatology [facility]   Diverticulosis  Diverticulosis is a condition that develops when small pouches (diverticula) form in the wall of the large intestine (colon). The colon is where water is absorbed and stool (feces) is formed. The pouches form when the inside layer of the colon pushes through weak spots in the outer layers of the colon. You may have a few pouches or many of them. The pouches usually do not cause problems unless they become inflamed or infected. When this happens, the condition is called diverticulitis. What are the causes?  The cause of this condition is not known. What increases the risk? The following factors may make you more likely to develop this condition:  Being older than age 45. Your risk for this condition increases with age. Diverticulosis is rare among people younger than age 44. By age 19, many people have it.  Eating a low-fiber diet.  Having frequent constipation.  Being overweight.  Not getting enough exercise.  Smoking.  Taking over-the-counter pain medicines, like aspirin and ibuprofen.  Having a family  history of diverticulosis. What are the signs or symptoms? In most people, there are no symptoms of this condition. If you do have symptoms, they may include:  Bloating.  Cramps in the abdomen.  Constipation or diarrhea.  Pain in the lower left side of the abdomen. How is this diagnosed? Because diverticulosis usually has no symptoms, it is most often diagnosed during an exam for other colon problems. The condition may be diagnosed by:  Using a flexible scope to examine the colon (colonoscopy).  Taking an X-ray of the colon after dye has been put into the colon (barium enema).  Having a CT scan. How is this treated? You may not need treatment for this condition. Your health care provider may recommend treatment to prevent problems. You may need treatment if you have symptoms or if you previously had diverticulitis. Treatment may include:  Eating a high-fiber diet.  Taking a fiber supplement.  Taking a live bacteria supplement (probiotic).  Taking medicine to relax your colon. Follow these instructions at home: Medicines  Take over-the-counter and prescription medicines only as told by your health care provider.  If told by your health care provider, take a fiber supplement or probiotic. Constipation prevention Your condition may cause constipation. To prevent or treat constipation, you may need to:  Drink enough fluid to keep your urine pale yellow.  Take over-the-counter or prescription medicines.  Eat foods that are high in fiber, such as beans, whole grains, and fresh fruits and vegetables.  Limit foods that are high in fat and processed sugars, such as fried or sweet foods.  General instructions  Try not to strain when you have a bowel movement.  Keep all follow-up visits as told by your health care provider. This is important. Contact a health care provider if you:  Have pain in your abdomen.  Have bloating.  Have cramps.  Have not had a bowel movement  in 3 days. Get help right away if:  Your pain gets worse.  Your bloating becomes very bad.  You have a fever or chills, and your symptoms suddenly get worse.  You vomit.  You have bowel movements that are bloody or black.  You have bleeding from your rectum. Summary  Diverticulosis is a condition that develops when small pouches (diverticula) form in the wall of the large intestine (colon).  You may have a few pouches or many of them.  This condition is most often diagnosed during an exam for other colon problems.  Treatment may include increasing the fiber in your diet, taking supplements, or taking medicines. This information is not intended to replace advice given to you by your health care provider. Make sure you discuss any questions you have with your health care provider. Document Revised: 11/21/2018 Document Reviewed: 11/21/2018 Elsevier Patient Education  2020 [employer].    Hemorrhoids Hemorrhoids are swollen veins in and around the rectum or anus. There are two types of hemorrhoids:  Internal hemorrhoids. These occur in the veins that  are just inside the rectum. They may poke through to the outside and become irritated and painful.  External hemorrhoids. These occur in the veins that are outside the anus and can be felt as a painful swelling or hard lump near the anus. Most hemorrhoids do not cause serious problems, and they can be managed with home treatments such as diet and lifestyle changes. If home treatments do not help the symptoms, procedures can be done to shrink or remove the hemorrhoids. What are the causes? This condition is caused by increased pressure in the anal area. This pressure may result from various things, including:  Constipation.  Straining to have a bowel movement.  Diarrhea.  Pregnancy.  Obesity.  Sitting for long periods of time.  Heavy lifting or other activity that causes you to strain.  Anal sex.  Riding a bike for a  long period of time. What are the signs or symptoms? Symptoms of this condition include:  Pain.  Anal itching or irritation.  Rectal bleeding.  Leakage of stool (feces).  Anal swelling.  One or more lumps around the anus. How is this diagnosed? This condition can often be diagnosed through a visual exam. Other exams or tests may also be done, such as:  An exam that involves feeling the rectal area with a gloved hand (digital rectal exam).  An exam of the anal canal that is done using a small tube (anoscope).  A blood test, if you have lost a significant amount of blood.  A test to look inside the colon using a flexible tube with a camera on the end (sigmoidoscopy or colonoscopy). How is this treated? This condition can usually be treated at home. However, various procedures may be done if dietary changes, lifestyle changes, and other home treatments do not help your symptoms. These procedures can help make the hemorrhoids smaller or remove them completely. Some of these procedures involve surgery, and others do not. Common procedures include:  Rubber band ligation. Rubber bands are placed at the base of the hemorrhoids to cut off their blood supply.  Sclerotherapy. Medicine is injected into the hemorrhoids to shrink them.  Infrared coagulation. A type of light energy is used to get rid of the hemorrhoids.  Hemorrhoidectomy surgery. The hemorrhoids are surgically removed, and the veins that supply them are tied off.  Stapled hemorrhoidopexy surgery. The surgeon staples the base of the hemorrhoid to the rectal wall. Follow these instructions at home: Eating and drinking   Eat foods that have a lot of fiber in them, such as whole grains, beans, nuts, fruits, and vegetables.  Ask your health care provider about taking products that have added fiber (fiber supplements).  Reduce the amount of fat in your diet. You can do this by eating low-fat dairy products, eating less red  meat, and avoiding processed foods.  Drink enough fluid to keep your urine pale yellow. Managing pain and swelling   Take warm sitz baths for 20 minutes, 3-4 times a day to ease pain and discomfort. You may do this in a bathtub or using a portable sitz bath that fits over the toilet.  If directed, apply ice to the affected area. Using ice packs between sitz baths may be helpful. ? Put ice in a plastic bag. ? Place a towel between your skin and the bag. ? Leave the ice on for 20 minutes, 2-3 times a day. General instructions  Take over-the-counter and prescription medicines only as told by your health care provider.  Use medicated creams or suppositories as told.  Get regular exercise. Ask your health care provider how much and what kind of exercise is best for you. In general, you should do moderate exercise for at least 30 minutes on most days of the week (150 minutes each week). This can include activities such as walking, biking, or yoga.  Go to the bathroom when you have the urge to have a bowel movement. Do not wait.  Avoid straining to have bowel movements.  Keep the anal area dry and clean. Use wet toilet paper or moist towelettes after a bowel movement.  Do not sit on the toilet for long periods of time. This increases blood pooling and pain.  Keep all follow-up visits as told by your health care provider. This is important. Contact a health care provider if you have:  Increasing pain and swelling that are not controlled by treatment or medicine.  Difficulty having a bowel movement, or you are unable to have a bowel movement.  Pain or inflammation outside the area of the hemorrhoids. Get help right away if you have:  Uncontrolled bleeding from your rectum. Summary  Hemorrhoids are swollen veins in and around the rectum or anus.  Most hemorrhoids can be managed with home treatments such as diet and lifestyle changes.  Taking warm sitz baths can help ease pain and  discomfort.  In severe cases, procedures or surgery can be done to shrink or remove the hemorrhoids. This information is not intended to replace advice given to you by your health care provider. Make sure you discuss any questions you have with your health care provider. Document Revised: 09/20/2018 Document Reviewed: 09/13/2017 Elsevier Patient Education  2020 [employer].

## 2020-02-27 NOTE — Anesthesia Preprocedure Evaluation (Signed)
Anesthesia Evaluation  Patient identified by MRN, date of birth, ID band Patient awake    Reviewed: Allergy & Precautions, H&P , NPO status , Patient's Chart, lab work & pertinent test results, reviewed documented beta blocker date and time   Airway Mallampati: II  TM Distance: >3 FB Neck ROM: full    Dental no notable dental hx. (+) Teeth Intact   Pulmonary neg pulmonary ROS, former smoker,    Pulmonary exam normal breath sounds clear to auscultation       Cardiovascular Exercise Tolerance: Good hypertension, negative cardio ROS   Rhythm:regular Rate:Normal     Neuro/Psych negative neurological ROS  negative psych ROS   GI/Hepatic Neg liver ROS, GERD  Medicated,  Endo/Other  negative endocrine ROS  Renal/GU negative Renal ROS  negative genitourinary   Musculoskeletal   Abdominal   Peds  Hematology negative hematology ROS (+)   Anesthesia Other Findings   Reproductive/Obstetrics negative OB ROS                             Anesthesia Physical Anesthesia Plan  ASA: III  Anesthesia Plan: General   Post-op Pain Management:    Induction:   PONV Risk Score and Plan: Propofol infusion  Airway Management Planned:   Additional Equipment:   Intra-op Plan:   Post-operative Plan:   Informed Consent: I have reviewed the patients History and Physical, chart, labs and discussed the procedure including the risks, benefits and alternatives for the proposed anesthesia with the patient or authorized representative who has indicated his/her understanding and acceptance.     Dental Advisory Given  Plan Discussed with: CRNA  Anesthesia Plan Comments:         Anesthesia Quick Evaluation

## 2020-03-03 ENCOUNTER — Encounter (HOSPITAL_COMMUNITY): Payer: Self-pay | Admitting: Internal Medicine

## 2020-03-10 ENCOUNTER — Telehealth: Payer: Self-pay | Admitting: Family Medicine

## 2020-03-10 DIAGNOSIS — E559 Vitamin D deficiency, unspecified: Secondary | ICD-10-CM | POA: Diagnosis not present

## 2020-03-10 DIAGNOSIS — I10 Essential (primary) hypertension: Secondary | ICD-10-CM | POA: Diagnosis not present

## 2020-03-10 DIAGNOSIS — E663 Overweight: Secondary | ICD-10-CM | POA: Diagnosis not present

## 2020-03-10 DIAGNOSIS — E785 Hyperlipidemia, unspecified: Secondary | ICD-10-CM | POA: Diagnosis not present

## 2020-03-10 LAB — CBC
Platelets: 253 10*3/uL (ref 140–400)
WBC: 4.5 10*3/uL (ref 3.8–10.8)

## 2020-03-10 NOTE — Telephone Encounter (Signed)
Called patient to let her know that her labs are still active and ordered for [facility]. She can go to the lab and have them drawn.

## 2020-03-10 NOTE — Telephone Encounter (Signed)
Patient needing a lab order sent so she can get labs before her appointment 11/09

## 2020-03-11 ENCOUNTER — Encounter: Payer: Medicare HMO | Admitting: Family Medicine

## 2020-03-11 LAB — LIPID PANEL
Cholesterol: 190 mg/dL (ref <200)
HDL: 51 mg/dL (ref >=50)
LDL Cholesterol (Calc): 122 mg/dL (calc) — ABNORMAL HIGH
Non-HDL Cholesterol (Calc): 139 mg/dL (calc) — ABNORMAL HIGH (ref <130)
Total CHOL/HDL Ratio: 3.7 (calc) (ref <5.0)
Triglycerides: 75 mg/dL (ref <150)

## 2020-03-11 LAB — COMPLETE METABOLIC PANEL WITH GFR
AG Ratio: 1.7 (calc) (ref 1.0–2.5)
ALT: 12 U/L (ref 6–29)
AST: 17 U/L (ref 10–35)
Albumin: 4.7 g/dL (ref 3.6–5.1)
Alkaline phosphatase (APISO): 83 U/L (ref 37–153)
BUN/Creatinine Ratio: 26 (calc) — ABNORMAL HIGH (ref 6–22)
BUN: 24 mg/dL (ref 7–25)
CO2: 30 mmol/L (ref 20–32)
Calcium: 10.2 mg/dL (ref 8.6–10.4)
Chloride: 106 mmol/L (ref 98–110)
Creat: 0.94 mg/dL — ABNORMAL HIGH (ref 0.60–0.93)
GFR, Est African American: 70 mL/min/{1.73_m2} (ref >=60)
GFR, Est Non African American: 60 mL/min/{1.73_m2} (ref >=60)
Globulin: 2.7 g/dL (calc) (ref 1.9–3.7)
Glucose, Bld: 109 mg/dL — ABNORMAL HIGH (ref 65–99)
Potassium: 4.6 mmol/L (ref 3.5–5.3)
Sodium: 142 mmol/L (ref 135–146)
Total Bilirubin: 0.4 mg/dL (ref 0.2–1.2)
Total Protein: 7.4 g/dL (ref 6.1–8.1)

## 2020-03-11 LAB — CBC
HCT: 38.9 % (ref 35.0–45.0)
Hemoglobin: 13.1 g/dL (ref 11.7–15.5)
MCH: 30 pg (ref 27.0–33.0)
MCHC: 33.7 g/dL (ref 32.0–36.0)
MCV: 89.2 fL (ref 80.0–100.0)
MPV: 9.7 fL (ref 7.5–12.5)
RBC: 4.36 10*6/uL (ref 3.80–5.10)
RDW: 11.9 % (ref 11.0–15.0)

## 2020-03-11 LAB — TSH: TSH: 1.41 mIU/L (ref 0.40–4.50)

## 2020-03-11 LAB — VITAMIN D 25 HYDROXY (VIT D DEFICIENCY, FRACTURES): Vit D, 25-Hydroxy: 52 ng/mL (ref 30–100)

## 2020-03-11 NOTE — Telephone Encounter (Signed)
Called patient to let her know that she can go to the lab and have labs drawn. No answer, unable to leave vm, it is full.

## 2020-03-16 ENCOUNTER — Encounter: Payer: Self-pay | Admitting: Family Medicine

## 2020-03-16 ENCOUNTER — Ambulatory Visit (INDEPENDENT_AMBULATORY_CARE_PROVIDER_SITE_OTHER): Payer: Medicare HMO | Admitting: Family Medicine

## 2020-03-16 ENCOUNTER — Encounter: Payer: Self-pay | Admitting: Internal Medicine

## 2020-03-16 ENCOUNTER — Other Ambulatory Visit: Payer: Self-pay

## 2020-03-16 VITALS — BP 150/70 | HR 68 | Resp 16 | Ht 62.0 in | Wt 141.0 lb

## 2020-03-16 DIAGNOSIS — Z0001 Encounter for general adult medical examination with abnormal findings: Secondary | ICD-10-CM

## 2020-03-16 DIAGNOSIS — M8580 Other specified disorders of bone density and structure, unspecified site: Secondary | ICD-10-CM

## 2020-03-16 DIAGNOSIS — I10 Essential (primary) hypertension: Secondary | ICD-10-CM

## 2020-03-16 DIAGNOSIS — K219 Gastro-esophageal reflux disease without esophagitis: Secondary | ICD-10-CM

## 2020-03-16 NOTE — Progress Notes (Signed)
    Colleen [name]     MRN: [id number]      DOB: 05/02/1946  HPI: Patient is in for annual physical exam. Substernal tintingling on average 5 mins every day, does exercises which help her chest C/o heartburn/ reflux symptoms increased in past several months, denies dysphaia  Recent labs,  are reviewed. Immunization is reviewed , and is up to date   PE: BP (!) 150/70   Pulse 68   Resp 16   Ht 5\' 2"  (1.575 m)   Wt 141 lb (64 kg)   SpO2 98%   BMI 25.79 kg/m   Pleasant  female, alert and oriented x 3, in no cardio-pulmonary distress. Afebrile. HEENT No facial trauma or asymetry. Sinuses non tender.  Extra occullar muscles intact.. External ears normal, . Neck: supple, no adenopathy,JVD or thyromegaly.No bruits.  Chest: Clear to ascultation bilaterally.No crackles or wheezes. Non tender to palpation  Breast: No physical exam. Asymptomatic Normal mammogram 12/2019  Cardiovascular system; Heart sounds normal,  S1 and  S2 ,no S3.  No murmur, or thrill. Apical beat not displaced Peripheral pulses normal.  Abdomen: Soft, non tender, no organomegaly or masses. No bruits. Bowel sounds normal. No guarding, tenderness or rebound.   .   Musculoskeletal exam: Full ROM of spine, hips , shoulders and mildly reduced in  knees. Mild  deformity ,swelling and  crepitus noted.in knees No muscle wasting or atrophy.   Neurologic: Cranial nerves 2 to 12 intact. Power, tone ,sensation  normal throughout. No disturbance in gait. No tremor.  Skin: Intact, no ulceration, erythema , scaling or rash noted. Pigmentation normal throughout  Psych; Normal mood and affect. Judgement and concentration normal   Assessment & Plan:  Encounter for annual general medical examination with abnormal findings in adult Annual exam as documented. Counseling done  re healthy lifestyle involving commitment to 150 minutes exercise per week, heart healthy diet, and attaining healthy weight.The  importance of adequate sleep also discussed. Regular seat belt use and home safety, is also discussed. Changes in health habits are decided on by the patient with goals and time frames  set for achieving them. Immunization and cancer screening needs are specifically addressed at this visit.   GERD 4 month h/o uncontrolled gERD, refer to GI  Essential hypertension Elevated at visit, review in 3 to 4 months DASH diet discussed and printed material provided

## 2020-03-16 NOTE — Assessment & Plan Note (Signed)
4 month h/o uncontrolled gERD, refer to GI

## 2020-03-16 NOTE — Assessment & Plan Note (Signed)

## 2020-03-16 NOTE — Patient Instructions (Addendum)
F/U with Dr Lodema Hong 2nd week in January, re evaluate blood pressure and EKG at that visit also  Please schedule bone density test at  checkout  Please reduce salt intake and increase  fresh fruits and vegetables  You are referred to GI re chest discomfort  It is important that you exercise regularly at least 30 minutes 5 times a week. If you develop chest pain, have severe difficulty breathing, or feel very tired, stop exercising immediately and seek medical attention  .     DASH Eating Plan DASH stands for "Dietary Approaches to Stop Hypertension." The DASH eating plan is a healthy eating plan that has been shown to reduce high blood pressure (hypertension). It may also reduce your risk for type 2 diabetes, heart disease, and stroke. The DASH eating plan may also help with weight loss. What are tips for following this plan?  General guidelines  Avoid eating more than 2,300 mg (milligrams) of salt (sodium) a day. If you have hypertension, you may need to reduce your sodium intake to 1,500 mg a day.  Limit alcohol intake to no more than 1 drink a day for nonpregnant women and 2 drinks a day for men. One drink equals 12 oz of beer, 5 oz of wine, or 1 oz of hard liquor.  Work with your health care provider to maintain a healthy body weight or to lose weight. Ask what an ideal weight is for you.  Get at least 30 minutes of exercise that causes your heart to beat faster (aerobic exercise) most days of the week. Activities may include walking, swimming, or biking.  Work with your health care provider or diet and nutrition specialist (dietitian) to adjust your eating plan to your individual calorie needs. Reading food labels   Check food labels for the amount of sodium per serving. Choose foods with less than 5 percent of the Daily Value of sodium. Generally, foods with less than 300 mg of sodium per serving fit into this eating plan.  To find whole grains, look for the word "whole" as  the first word in the ingredient list. Shopping  Buy products labeled as "low-sodium" or "no salt added."  Buy fresh foods. Avoid canned foods and premade or frozen meals. Cooking  Avoid adding salt when cooking. Use salt-free seasonings or herbs instead of table salt or sea salt. Check with your health care provider or pharmacist before using salt substitutes.  Do not fry foods. Cook foods using healthy methods such as baking, boiling, grilling, and broiling instead.  Cook with heart-healthy oils, such as olive, canola, soybean, or sunflower oil. Meal planning  Eat a balanced diet that includes: ? 5 or more servings of fruits and vegetables each day. At each meal, try to fill half of your plate with fruits and vegetables. ? Up to 6-8 servings of whole grains each day. ? Less than 6 oz of lean meat, poultry, or fish each day. A 3-oz serving of meat is about the same size as a deck of cards. One egg equals 1 oz. ? 2 servings of low-fat dairy each day. ? A serving of nuts, seeds, or beans 5 times each week. ? Heart-healthy fats. Healthy fats called Omega-3 fatty acids are found in foods such as flaxseeds and coldwater fish, like sardines, salmon, and mackerel.  Limit how much you eat of the following: ? Canned or prepackaged foods. ? Food that is high in trans fat, such as fried foods. ? Food that is high  in saturated fat, such as fatty meat. ? Sweets, desserts, sugary drinks, and other foods with added sugar. ? Full-fat dairy products.  Do not salt foods before eating.  Try to eat at least 2 vegetarian meals each week.  Eat more home-cooked food and less restaurant, buffet, and fast food.  When eating at a restaurant, ask that your food be prepared with less salt or no salt, if possible. What foods are recommended? The items listed may not be a complete list. Talk with your dietitian about what dietary choices are best for you. Grains Whole-grain or whole-wheat bread.  Whole-grain or whole-wheat pasta. Brown rice. [place]row. Bulgur. Whole-grain and low-sodium cereals. Pita bread. Low-fat, low-sodium crackers. Whole-wheat flour tortillas. Vegetables Fresh or frozen vegetables (raw, steamed, roasted, or grilled). Low-sodium or reduced-sodium tomato and vegetable juice. Low-sodium or reduced-sodium tomato sauce and tomato paste. Low-sodium or reduced-sodium canned vegetables. Fruits All fresh, dried, or frozen fruit. Canned fruit in natural juice (without added sugar). Meat and other protein foods Skinless chicken or [place]. Ground chicken or [place]. Pork with fat trimmed off. Fish and seafood. Egg whites. Dried beans, peas, or lentils. Unsalted nuts, nut butters, and seeds. Unsalted canned beans. Lean cuts of beef with fat trimmed off. Low-sodium, lean deli meat. Dairy Low-fat (1%) or fat-free (skim) milk. Fat-free, low-fat, or reduced-fat cheeses. Nonfat, low-sodium ricotta or cottage cheese. Low-fat or nonfat yogurt. Low-fat, low-sodium cheese. Fats and oils Soft margarine without trans fats. Vegetable oil. Low-fat, reduced-fat, or light mayonnaise and salad dressings (reduced-sodium). Canola, safflower, olive, soybean, and sunflower oils. Avocado. Seasoning and other foods Herbs. Spices. Seasoning mixes without salt. Unsalted popcorn and pretzels. Fat-free sweets. What foods are not recommended? The items listed may not be a complete list. Talk with your dietitian about what dietary choices are best for you. Grains Baked goods made with fat, such as croissants, muffins, or some breads. Dry pasta or rice meal packs. Vegetables Creamed or fried vegetables. Vegetables in a cheese sauce. Regular canned vegetables (not low-sodium or reduced-sodium). Regular canned tomato sauce and paste (not low-sodium or reduced-sodium). Regular tomato and vegetable juice (not low-sodium or reduced-sodium). Angie Fava. Olives. Fruits Canned fruit in a light or heavy syrup.  Fried fruit. Fruit in cream or butter sauce. Meat and other protein foods Fatty cuts of meat. Ribs. Fried meat. Berniece Salines. Sausage. Bologna and other processed lunch meats. Salami. Fatback. Hotdogs. Bratwurst. Salted nuts and seeds. Canned beans with added salt. Canned or smoked fish. Whole eggs or egg yolks. Chicken or [place] with skin. Dairy Whole or 2% milk, cream, and half-and-half. Whole or full-fat cream cheese. Whole-fat or sweetened yogurt. Full-fat cheese. Nondairy creamers. Whipped toppings. Processed cheese and cheese spreads. Fats and oils Butter. Stick margarine. Lard. Shortening. Ghee. Bacon fat. Tropical oils, such as coconut, palm kernel, or palm oil. Seasoning and other foods Salted popcorn and pretzels. Onion salt, garlic salt, seasoned salt, table salt, and sea salt. Worcestershire sauce. Tartar sauce. Barbecue sauce. Teriyaki sauce. Soy sauce, including reduced-sodium. Steak sauce. Canned and packaged gravies. Fish sauce. Oyster sauce. Cocktail sauce. Horseradish that you find on the shelf. Ketchup. Mustard. Meat flavorings and tenderizers. Bouillon cubes. Hot sauce and Tabasco sauce. Premade or packaged marinades. Premade or packaged taco seasonings. Relishes. Regular salad dressings. Where to find more information:  National Heart, Lung, and [place]: https://wilson-eaton.com/  American Heart Association: www.heart.org Summary  The DASH eating plan is a healthy eating plan that has been shown to reduce high blood pressure (hypertension). It may  also reduce your risk for type 2 diabetes, heart disease, and stroke.  With the DASH eating plan, you should limit salt (sodium) intake to 2,300 mg a day. If you have hypertension, you may need to reduce your sodium intake to 1,500 mg a day.  When on the DASH eating plan, aim to eat more fresh fruits and vegetables, whole grains, lean proteins, low-fat dairy, and heart-healthy fats.  Work with your health care provider or diet and  nutrition specialist (dietitian) to adjust your eating plan to your individual calorie needs. This information is not intended to replace advice given to you by your health care provider. Make sure you discuss any questions you have with your health care provider. Document Revised: 04/06/2017 Document Reviewed: 04/17/2016 Elsevier Patient Education  2020 [employer].

## 2020-03-17 NOTE — Assessment & Plan Note (Signed)
Elevated at visit, review in 3 to 4 months DASH diet discussed and printed material provided

## 2020-03-23 ENCOUNTER — Telehealth: Payer: Self-pay

## 2020-03-23 NOTE — Telephone Encounter (Signed)
Please call pt to r/s her New apt office visit.

## 2020-03-23 NOTE — Telephone Encounter (Signed)
Called and rescheduled patient.

## 2020-04-09 ENCOUNTER — Encounter: Payer: Self-pay | Admitting: Nurse Practitioner

## 2020-04-09 ENCOUNTER — Ambulatory Visit (INDEPENDENT_AMBULATORY_CARE_PROVIDER_SITE_OTHER): Payer: Medicare HMO | Admitting: Nurse Practitioner

## 2020-04-09 DIAGNOSIS — Z0000 Encounter for general adult medical examination without abnormal findings: Secondary | ICD-10-CM

## 2020-04-09 NOTE — Progress Notes (Signed)
Subjective:   Colleen [name] is a 74 y.o. female who presents for Medicare Annual (Subsequent) preventive examination.        Objective:    There were no vitals filed for this visit. There is no height or weight on file to calculate BMI.  Advanced Directives 02/27/2020 08/20/2017 11/15/2016 07/13/2016 01/21/2015 01/14/2015 01/12/2015  Does Patient Have a Medical Advance Directive? No No No No No No No  Does patient want to make changes to medical advance directive? - Yes (MAU/Ambulatory/Procedural Areas - Information given) - - - - -  Would patient like information on creating a medical advance directive? Yes (MAU/Ambulatory/Procedural Areas - Information given) - No - Patient declined Yes (MAU/Ambulatory/Procedural Areas - Information given) No - patient declined information No - patient declined information Yes - Educational materials given    Current Medications (verified) Outpatient Encounter Medications as of 04/09/2020  Medication Sig  . acetaminophen (TYLENOL) 650 MG CR tablet Take 650 mg by mouth every 8 (eight) hours as needed for pain.   Marland Kitchen alendronate (FOSAMAX) 70 MG tablet TAKE 1 TABLET EVERY 7 DAYS TAKE WITH A FULL GLASS OF  WATER ON AN EMPTY STOMACH (Patient taking differently: Take 70 mg by mouth every Saturday. With a full glass of water & on an empty stomach)  . Artificial Tear Solution (SOOTHE XP) SOLN Place 1 application into both eyes 3 (three) times daily.   . Ascorbic Acid (VITAMIN C) 1000 MG tablet Take 1,000 mg by mouth 2 (two) times a week.   . Calcium Carb-Cholecalciferol (CALTRATE 600+D) 600-800 MG-UNIT TABS Take 1 tablet by mouth daily.   . Cholecalciferol (VITAMIN D3) 125 MCG (5000 UT) CAPS Take 5,000 Units by mouth daily.   . Coenzyme Q10 (COQ10) 200 MG CAPS Take 200 mg by mouth daily.  . diphenhydrAMINE (BENADRYL) 25 MG tablet Take 25 mg by mouth daily as needed for allergies.   Marland Kitchen latanoprost (XALATAN) 0.005 % ophthalmic solution Place 1 drop into both eyes at  bedtime.  Marland Kitchen lisinopril-hydrochlorothiazide (ZESTORETIC) 20-25 MG tablet Take 1 tablet by mouth daily.  . Multiple Vitamin (MULTIVITAMIN WITH MINERALS) TABS tablet Take 1 tablet by mouth daily.  . pantoprazole (PROTONIX) 40 MG tablet Take 1 tablet by mouth once daily  . rosuvastatin (CRESTOR) 5 MG tablet TAKE 1 TABLET EVERY MONDAY,WEDNESDAY, FRIDAY AND      SUNDAY (Patient taking differently: Take 5 mg by mouth See admin instructions. Take 1 tablet (5 mg) by mouth on Mondays, Wednesdays, Fridays, & Sundays in the morning.)   No facility-administered encounter medications on file as of 04/09/2020.    Allergies (verified) Aspirin, Fenofibrate, Statins, and Flonase [fluticasone]   History: Past Medical History:  Diagnosis Date  . GERD (gastroesophageal reflux disease)   . Glaucoma   . Hyperlipidemia   . Hypertension    Past Surgical History:  Procedure Laterality Date  . CHOLECYSTECTOMY    . CHONDROPLASTY Left 12/11/2014   Procedure: CHONDROPLASTY LEFT MEDIAL FEMORAL CONDYLE;  Surgeon: Vickki Hearing, MD;  Location: [facility];  Service: Orthopedics;  Laterality: Left;  . COLONOSCOPY WITH PROPOFOL N/A 02/27/2020   Procedure: COLONOSCOPY WITH PROPOFOL;  Surgeon: Lanelle Bal, DO;  Location: [facility];  Service: Endoscopy;  Laterality: N/A;  10:30  . KNEE ARTHROSCOPY WITH LATERAL MENISECTOMY Left 12/11/2014   Procedure: KNEE ARTHROSCOPY WITH LATERAL AND MEDIAL MENISECTOMY;  Surgeon: Vickki Hearing, MD;  Location: [facility];  Service: Orthopedics;  Laterality: Left;   Family History  Problem Relation Age of Onset  . Hypertension Mother   . Heart disease Mother [id number]       MI  . Hypertension Father   . Diabetes Father   . Stroke Father   . Hypertension Sister   . Diabetes Sister   . Multiple sclerosis Sister   . Hypertension Sister   . Diabetes Sister   . Heart disease Sister   . Kidney disease Sister   . Hypertension Brother   . Schizophrenia Brother [id number]   Social History    Socioeconomic History  . Marital status: Married    Spouse name: Not on file  . Number of children: Not on file  . Years of education: Not on file  . Highest education level: 12th grade  Occupational History  . Not on file  Tobacco Use  . Smoking status: Former Games developermoker  . Smokeless tobacco: Never Used  Vaping Use  . Vaping Use: Never used  Substance and Sexual Activity  . Alcohol use: No  . Drug use: No  . Sexual activity: Never  Other Topics Concern  . Not on file  Social History Narrative  . Not on file   Social Determinants of Health   Financial Resource Strain:   . Difficulty of Paying Living Expenses: Not on file  Food Insecurity:   . Worried About Programme researcher, broadcasting/film/videounning Out of Food in the Last Year: Not on file  . Ran Out of Food in the Last Year: Not on file  Transportation Needs:   . Lack of Transportation (Medical): Not on file  . Lack of Transportation (Non-Medical): Not on file  Physical Activity:   . Days of Exercise per Week: Not on file  . Minutes of Exercise per Session: Not on file  Stress:   . Feeling of Stress : Not on file  Social Connections:   . Frequency of Communication with Friends and Family: Not on file  . Frequency of Social Gatherings with Friends and Family: Not on file  . Attends Religious Services: Not on file  . Active Member of Clubs or Organizations: Not on file  . Attends BankerClub or Organization Meetings: Not on file  . Marital Status: Not on file    Tobacco Counseling Counseling given: Not Answered   Clinical Intake:                 Diabetic? No          Activities of Daily Living No flowsheet data found.  Patient Care Team: Kerri PerchesSimpson, Margaret E, MD as PCP - General Diona BrownerMcDowell, Illene BolusSamuel G, MD as PCP - Cardiology (Cardiology) Vickki HearingHarrison, Stanley E, MD as Consulting Physician (Orthopedic Surgery) Lanelle Balarver, Charles K, DO as Consulting Physician (Internal Medicine)  Indicate any recent Medical Services you may have received from  other than [facility] providers in the past year (date may be approximate).     Assessment:   This is a routine wellness examination for ConverseAnnie.  Hearing/Vision screen No exam data present  Dietary issues and exercise activities discussed:    Goals    . Exercise 3x per week (30 min per time)     Recommend increasing your exercise to 30 minutes a day for at least 3 days a week.       Depression Screen PHQ 2/9 Scores 03/16/2020 11/18/2018 08/22/2018 05/16/2018 03/11/2018 02/06/2018 08/20/2017  PHQ - 2 Score 0 0 0 0 0 2 0  PHQ- 9 Score - - - 0 1 5 -    Fall Risk  Fall Risk  03/16/2020 03/11/2019 11/18/2018 11/18/2018 11/18/2018  Falls in the past year? 0 0 0 0 0  Number falls in past yr: 0 0 0 0 0  Injury with Fall? 0 0 0 0 0    Any stairs in or around the home? Yes  If so, are there any without handrails? Yes  Home free of loose throw rugs in walkways, pet beds, electrical cords, etc? Yes  Adequate lighting in your home to reduce risk of falls? Yes   ASSISTIVE DEVICES UTILIZED TO PREVENT FALLS:  Life alert? No  Use of a cane, walker or w/c? Yes  Grab bars in the bathroom? No  Shower chair or bench in shower? No  Elevated toilet seat or a handicapped toilet? No   TIMED UP AND GO:  Was the test performed? No .    Cognitive Function:     6CIT Screen 08/23/2018 08/20/2017 07/13/2016  What Year? 0 points 0 points 0 points  What month? 0 points 0 points 0 points  What time? 0 points 0 points 0 points  Count back from 20 0 points 0 points 0 points  Months in reverse 0 points 0 points 0 points  Repeat phrase 4 points 4 points 0 points  Total Score 4 4 0    Immunizations Immunization History  Administered Date(s) Administered  . Fluad Quad(high Dose 65+) 01/06/2019, 01/16/2020  . Influenza Split 03/08/2011  . Influenza Whole 01/22/2007, 02/04/2009, 01/12/2010  . Influenza,inj,Quad PF,6+ Mos 01/29/2013, 03/16/2014, 03/09/2015, 01/07/2016, 01/15/2017, 01/24/2018  . Moderna SARS-COVID-2  Vaccination 06/04/2019, 07/02/2019  . Pneumococcal Conjugate-13 11/12/2014  . Pneumococcal Polysaccharide-23 06/26/2012  . Td 10/22/2003  . Zoster 09/02/2007    TDAP status: Up to date Flu Vaccine status: Up to date Pneumococcal vaccine status: Up to date Covid-19 vaccine status: Completed vaccines  Qualifies for Shingles Vaccine? No   Zostavax completed Yes   Shingrix Completed?: Yes  Screening Tests Health Maintenance  Topic Date Due  . TETANUS/TDAP  03/22/2022 (Originally 10/21/2013)  . MAMMOGRAM  12/31/2021  . COLONOSCOPY  02/26/2030  . INFLUENZA VACCINE  Completed  . DEXA SCAN  Completed  . COVID-19 Vaccine  Completed  . Hepatitis C Screening  Completed  . PNA vac Low Risk Adult  Completed    Health Maintenance  There are no preventive care reminders to display for this patient.  Colorectal cancer screening: Completed normal. Repeat every 10 years Mammogram status: Completed normal. Repeat every year Bone Density Screening: Completed.   Lung Cancer Screening: (Low Dose CT Chest recommended if Age 56-80 years, 30 pack-year currently smoking OR have quit w/in 15years.) Does not qualify.     Additional Screening:  Hepatitis C Screening: does qualify; Completed.   Vision Screening: Recommended annual ophthalmology exams for early detection of glaucoma and other disorders of the eye. Is the patient up to date with their annual eye exam?  Yes    Who is the provider or what is the name of the office in which the patient attends annual eye exams? Dr. Charise Killian   If pt is not established with a provider, would they like to be referred to a provider to establish care? No .   Dental Screening: Recommended annual dental exams for proper oral hygiene  Community Resource Referral / Chronic Care Management: CRR required this visit?  No   CCM required this visit?  No      Plan:     I have personally reviewed and noted the following in  the patient's chart:   . Medical  and social history . Use of alcohol, tobacco or illicit drugs  . Current medications and supplements . Functional ability and status . Nutritional status . Physical activity . Advanced directives . List of other physicians . Hospitalizations, surgeries, and ER visits in previous 12 months . Vitals . Screenings to include cognitive, depression, and falls . Referrals and appointments  In addition, I have reviewed and discussed with patient certain preventive protocols, quality metrics, and best practice recommendations. A written personalized care plan for preventive services as well as general preventive health recommendations were provided to patient.     Dellia Cloud, LPN   16/05/958   Nurse Notes: AWV conducted by phone with pt consent to televisit via audio. Pt was present in the home at the time of call and provider in office. This call took approx 20 min.

## 2020-04-09 NOTE — Patient Instructions (Addendum)
Colleen [name] , Thank you for taking time to come for your Medicare Wellness Visit. I appreciate your ongoing commitment to your health goals. Please review the following plan we discussed and let me know if I can assist you in the future.   Screening recommendations/referrals: Colonoscopy: Completed; DUE 02/26/2030  Mammogram: Completed; DUE 12/31/2021  Bone Density: Completed; No longer required.   Recommended yearly ophthalmology/optometry visit for glaucoma screening and checkup Recommended yearly dental visit for hygiene and checkup  Vaccinations: Influenza vaccine: Completed  Pneumococcal vaccine: Completed  Tdap vaccine: Completed; DUE 03/22/2022  Shingles vaccine: Completed     Advanced directives: N/A   Conditions/risks identified: Pt very compliant with health maintenance goals, and continues to try to stay active.   Next appointment: 05/19/2020 @ 8:40am with Dr. Lodema Hong    Preventive Care 65 Years and Older, Female Preventive care refers to lifestyle choices and visits with your health care provider that can promote health and wellness. What does preventive care include?  A yearly physical exam. This is also called an annual well check.  Dental exams once or twice a year.  Routine eye exams. Ask your health care provider how often you should have your eyes checked.  Personal lifestyle choices, including:  Daily care of your teeth and gums.  Regular physical activity.  Eating a healthy diet.  Avoiding tobacco and drug use.  Limiting alcohol use.  Practicing safe sex.  Taking low-dose aspirin every day.  Taking vitamin and mineral supplements as recommended by your health care provider. What happens during an annual well check? The services and screenings done by your health care provider during your annual well check will depend on your age, overall health, lifestyle risk factors, and family history of disease. Counseling  Your health care provider may ask  you questions about your:  Alcohol use.  Tobacco use.  Drug use.  Emotional well-being.  Home and relationship well-being.  Sexual activity.  Eating habits.  History of falls.  Memory and ability to understand (cognition).  Work and work Astronomer.  Reproductive health. Screening  You may have the following tests or measurements:  Height, weight, and BMI.  Blood pressure.  Lipid and cholesterol levels. These may be checked every 5 years, or more frequently if you are over 21 years old.  Skin check.  Lung cancer screening. You may have this screening every year starting at age 33 if you have a 30-pack-year history of smoking and currently smoke or have quit within the past 15 years.  Fecal occult blood test (FOBT) of the stool. You may have this test every year starting at age 47.  Flexible sigmoidoscopy or colonoscopy. You may have a sigmoidoscopy every 5 years or a colonoscopy every 10 years starting at age 67.  Hepatitis C blood test.  Hepatitis B blood test.  Sexually transmitted disease (STD) testing.  Diabetes screening. This is done by checking your blood sugar (glucose) after you have not eaten for a while (fasting). You may have this done every 1-3 years.  Bone density scan. This is done to screen for osteoporosis. You may have this done starting at age 36.  Mammogram. This may be done every 1-2 years. Talk to your health care provider about how often you should have regular mammograms. Talk with your health care provider about your test results, treatment options, and if necessary, the need for more tests. Vaccines  Your health care provider may recommend certain vaccines, such as:  Influenza vaccine. This is  recommended every year.  Tetanus, diphtheria, and acellular pertussis (Tdap, Td) vaccine. You may need a Td booster every 10 years.  Zoster vaccine. You may need this after age 28.  Pneumococcal 13-valent conjugate (PCV13) vaccine. One dose  is recommended after age 78.  Pneumococcal polysaccharide (PPSV23) vaccine. One dose is recommended after age 44. Talk to your health care provider about which screenings and vaccines you need and how often you need them. This information is not intended to replace advice given to you by your health care provider. Make sure you discuss any questions you have with your health care provider. Document Released: 05/21/2015 Document Revised: 01/12/2016 Document Reviewed: 02/23/2015 Elsevier Interactive Patient Education  2017 [place] Prevention in the Home Falls can cause injuries. They can happen to people of all ages. There are many things you can do to make your home safe and to help prevent falls. What can I do on the outside of my home?  Regularly fix the edges of walkways and driveways and fix any cracks.  Remove anything that might make you trip as you walk through a door, such as a raised step or threshold.  Trim any bushes or trees on the path to your home.  Use bright outdoor lighting.  Clear any walking paths of anything that might make someone trip, such as rocks or tools.  Regularly check to see if handrails are loose or broken. Make sure that both sides of any steps have handrails.  Any raised decks and porches should have guardrails on the edges.  Have any leaves, snow, or ice cleared regularly.  Use sand or salt on walking paths during winter.  Clean up any spills in your garage right away. This includes oil or grease spills. What can I do in the bathroom?  Use night lights.  Install grab bars by the toilet and in the tub and shower. Do not use towel bars as grab bars.  Use non-skid mats or decals in the tub or shower.  If you need to sit down in the shower, use a plastic, non-slip stool.  Keep the floor dry. Clean up any water that spills on the floor as soon as it happens.  Remove soap buildup in the tub or shower regularly.  Attach bath mats  securely with double-sided non-slip rug tape.  Do not have throw rugs and other things on the floor that can make you trip. What can I do in the bedroom?  Use night lights.  Make sure that you have a light by your bed that is easy to reach.  Do not use any sheets or blankets that are too big for your bed. They should not hang down onto the floor.  Have a firm chair that has side arms. You can use this for support while you get dressed.  Do not have throw rugs and other things on the floor that can make you trip. What can I do in the kitchen?  Clean up any spills right away.  Avoid walking on wet floors.  Keep items that you use a lot in easy-to-reach places.  If you need to reach something above you, use a strong step stool that has a grab bar.  Keep electrical cords out of the way.  Do not use floor polish or wax that makes floors slippery. If you must use wax, use non-skid floor wax.  Do not have throw rugs and other things on the floor that can make you trip.  What can I do with my stairs?  Do not leave any items on the stairs.  Make sure that there are handrails on both sides of the stairs and use them. Fix handrails that are broken or loose. Make sure that handrails are as long as the stairways.  Check any carpeting to make sure that it is firmly attached to the stairs. Fix any carpet that is loose or worn.  Avoid having throw rugs at the top or bottom of the stairs. If you do have throw rugs, attach them to the floor with carpet tape.  Make sure that you have a light switch at the top of the stairs and the bottom of the stairs. If you do not have them, ask someone to add them for you. What else can I do to help prevent falls?  Wear shoes that:  Do not have high heels.  Have rubber bottoms.  Are comfortable and fit you well.  Are closed at the toe. Do not wear sandals.  If you use a stepladder:  Make sure that it is fully opened. Do not climb a closed  stepladder.  Make sure that both sides of the stepladder are locked into place.  Ask someone to hold it for you, if possible.  Clearly mark and make sure that you can see:  Any grab bars or handrails.  First and last steps.  Where the edge of each step is.  Use tools that help you move around (mobility aids) if they are needed. These include:  Canes.  Walkers.  Scooters.  Crutches.  Turn on the lights when you go into a dark area. Replace any light bulbs as soon as they burn out.  Set up your furniture so you have a clear path. Avoid moving your furniture around.  If any of your floors are uneven, fix them.  If there are any pets around you, be aware of where they are.  Review your medicines with your doctor. Some medicines can make you feel dizzy. This can increase your chance of falling. Ask your doctor what other things that you can do to help prevent falls. This information is not intended to replace advice given to you by your health care provider. Make sure you discuss any questions you have with your health care provider. Document Released: 02/18/2009 Document Revised: 09/30/2015 Document Reviewed: 05/29/2014 Elsevier Interactive Patient Education  2017 [employer].

## 2020-04-22 ENCOUNTER — Ambulatory Visit: Payer: Medicare HMO | Admitting: Internal Medicine

## 2020-05-03 ENCOUNTER — Other Ambulatory Visit: Payer: Self-pay | Admitting: Family Medicine

## 2020-05-03 DIAGNOSIS — I10 Essential (primary) hypertension: Secondary | ICD-10-CM

## 2020-05-03 DIAGNOSIS — E785 Hyperlipidemia, unspecified: Secondary | ICD-10-CM

## 2020-05-04 ENCOUNTER — Other Ambulatory Visit: Payer: Self-pay | Admitting: Family Medicine

## 2020-05-05 DIAGNOSIS — H2513 Age-related nuclear cataract, bilateral: Secondary | ICD-10-CM | POA: Diagnosis not present

## 2020-05-05 DIAGNOSIS — H40051 Ocular hypertension, right eye: Secondary | ICD-10-CM | POA: Diagnosis not present

## 2020-05-05 DIAGNOSIS — H40053 Ocular hypertension, bilateral: Secondary | ICD-10-CM | POA: Diagnosis not present

## 2020-05-19 ENCOUNTER — Other Ambulatory Visit: Payer: Self-pay

## 2020-05-19 ENCOUNTER — Ambulatory Visit (INDEPENDENT_AMBULATORY_CARE_PROVIDER_SITE_OTHER): Payer: Medicare HMO | Admitting: Nurse Practitioner

## 2020-05-19 ENCOUNTER — Encounter: Payer: Self-pay | Admitting: Nurse Practitioner

## 2020-05-19 VITALS — BP 109/67 | HR 64 | Temp 98.9°F | Resp 20 | Ht 62.0 in | Wt 140.0 lb

## 2020-05-19 DIAGNOSIS — E785 Hyperlipidemia, unspecified: Secondary | ICD-10-CM

## 2020-05-19 DIAGNOSIS — I10 Essential (primary) hypertension: Secondary | ICD-10-CM

## 2020-05-19 DIAGNOSIS — M810 Age-related osteoporosis without current pathological fracture: Secondary | ICD-10-CM | POA: Diagnosis not present

## 2020-05-19 NOTE — Progress Notes (Signed)
Acute Office Visit  Subjective:    Patient ID: Colleen [name], female    DOB: [birth year], 75 y.o.   MRN: [id number]  Chief Complaint  Patient presents with  . Follow-up  . Hypertension    HPI Patient is in today for BP check. At her last appt., her BP was elevated with SBP in the 150s. Dr. Rosezena Sensor ordered an EKG to be performed today as well as a BP check.  Past Medical History:  Diagnosis Date  . GERD (gastroesophageal reflux disease)   . Glaucoma   . Hyperlipidemia   . Hypertension     Past Surgical History:  Procedure Laterality Date  . CHOLECYSTECTOMY    . CHONDROPLASTY Left 12/11/2014   Procedure: CHONDROPLASTY LEFT MEDIAL FEMORAL CONDYLE;  Surgeon: Carole Civil, MD;  Location: [facility];  Service: Orthopedics;  Laterality: Left;  . COLONOSCOPY WITH PROPOFOL N/A 02/27/2020   Procedure: COLONOSCOPY WITH PROPOFOL;  Surgeon: Eloise Harman, DO;  Location: [facility];  Service: Endoscopy;  Laterality: N/A;  10:30  . KNEE ARTHROSCOPY WITH LATERAL MENISECTOMY Left 12/11/2014   Procedure: KNEE ARTHROSCOPY WITH LATERAL AND MEDIAL MENISECTOMY;  Surgeon: Carole Civil, MD;  Location: [facility];  Service: Orthopedics;  Laterality: Left;    Family History  Problem Relation Age of Onset  . Hypertension Mother   . Heart disease Mother 11       MI  . Hypertension Father   . Diabetes Father   . Stroke Father   . Hypertension Sister   . Diabetes Sister   . Multiple sclerosis Sister   . Hypertension Sister   . Diabetes Sister   . Heart disease Sister   . Kidney disease Sister   . Hypertension Brother   . Schizophrenia Brother 7    Social History   Socioeconomic History  . Marital status: Married    Spouse name: Not on file  . Number of children: Not on file  . Years of education: Not on file  . Highest education level: 12th grade  Occupational History  . Not on file  Tobacco Use  . Smoking status: Former Research scientist (life sciences)  . Smokeless tobacco: Never Used  Vaping Use   . Vaping Use: Never used  Substance and Sexual Activity  . Alcohol use: No  . Drug use: No  . Sexual activity: Never  Other Topics Concern  . Not on file  Social History Narrative  . Not on file   Social Determinants of Health   Financial Resource Strain: Low Risk   . Difficulty of Paying Living Expenses: Not hard at all  Food Insecurity: No Food Insecurity  . Worried About Charity fundraiser in the Last Year: Never true  . Ran Out of Food in the Last Year: Never true  Transportation Needs: No Transportation Needs  . Lack of Transportation (Medical): No  . Lack of Transportation (Non-Medical): No  Physical Activity: Sufficiently Active  . Days of Exercise per Week: 7 days  . Minutes of Exercise per Session: 30 min  Stress: No Stress Concern Present  . Feeling of Stress : Not at all  Social Connections: Moderately Isolated  . Frequency of Communication with Friends and Family: More than three times a week  . Frequency of Social Gatherings with Friends and Family: Twice a week  . Attends Religious Services: Never  . Active Member of Clubs or Organizations: No  . Attends Archivist Meetings: Never  . Marital Status: Married  Intimate Partner Violence: Not At Risk  . Fear of Current or Ex-Partner: No  . Emotionally Abused: No  . Physically Abused: No  . Sexually Abused: No    Outpatient Medications Prior to Visit  Medication Sig Dispense Refill  . acetaminophen (TYLENOL) 650 MG CR tablet Take 650 mg by mouth every 8 (eight) hours as needed for pain.     Marland Kitchen alendronate (FOSAMAX) 70 MG tablet TAKE 1 TABLET EVERY 7 DAYS TAKE WITH A FULL GLASS OF  WATER ON AN EMPTY STOMACH 12 tablet 1  . Artificial Tear Solution (SOOTHE XP) SOLN Place 1 application into both eyes 3 (three) times daily.     . Ascorbic Acid (VITAMIN C) 1000 MG tablet Take 1,000 mg by mouth 2 (two) times a week.     . Calcium Carb-Cholecalciferol 600-800 MG-UNIT TABS Take 1 tablet by mouth daily.  120  tablet 11  . Cholecalciferol (VITAMIN D3) 125 MCG (5000 UT) CAPS Take 5,000 Units by mouth daily.     . Coenzyme Q10 (COQ10) 200 MG CAPS Take 200 mg by mouth daily.    . diphenhydrAMINE (BENADRYL) 25 MG tablet Take 25 mg by mouth daily as needed for allergies.     Marland Kitchen latanoprost (XALATAN) 0.005 % ophthalmic solution Place 1 drop into both eyes at bedtime.  12  . lisinopril-hydrochlorothiazide (ZESTORETIC) 20-25 MG tablet Take 1 tablet by mouth daily. 90 tablet 1  . Multiple Vitamin (MULTIVITAMIN WITH MINERALS) TABS tablet Take 1 tablet by mouth daily.    . pantoprazole (PROTONIX) 40 MG tablet Take 1 tablet by mouth once daily 90 tablet 0  . rosuvastatin (CRESTOR) 5 MG tablet TAKE 1 TABLET EVERY MONDAY,WEDNESDAY, FRIDAY AND      SUNDAY (Patient taking differently: Take 5 mg by mouth See admin instructions. Take 1 tablet (5 mg) by mouth on Mondays, Wednesdays, Fridays, & Sundays in the morning.) 38 tablet 1   No facility-administered medications prior to visit.    Allergies  Allergen Reactions  . Aspirin Nausea Only    Upset stomach (higher doses)  . Fenofibrate Other (See Comments)    Muscle aches  . Statins Other (See Comments)    Muscle cramps  . Flonase [Fluticasone] Other (See Comments)    Caused increased pressure in the eye    Review of Systems  Constitutional: Negative.   Respiratory: Negative.   Cardiovascular: Negative.        Objective:    Physical Exam Constitutional:      Appearance: Normal appearance.  Cardiovascular:     Rate and Rhythm: Normal rate and regular rhythm.     Pulses: Normal pulses.     Heart sounds: Normal heart sounds.  Pulmonary:     Effort: Pulmonary effort is normal.     Breath sounds: Normal breath sounds.  Neurological:     Mental Status: She is alert.     BP 109/67   Pulse 64   Temp 98.9 F (37.2 C)   Resp 20   Ht 5' 2"  (1.575 m)   Wt 140 lb (63.5 kg)   SpO2 96%   BMI 25.61 kg/m  Wt Readings from Last 3 Encounters:  05/19/20  140 lb (63.5 kg)  03/16/20 141 lb (64 kg)  02/27/20 149 lb (67.6 kg)    Health Maintenance Due  Topic Date Due  . COVID-19 Vaccine (3 - Booster for Moderna series) 12/30/2019    There are no preventive care reminders to display for this patient.  Lab Results  Component Value Date   TSH 1.41 03/10/2020   Lab Results  Component Value Date   WBC 4.5 03/10/2020   HGB 13.1 03/10/2020   HCT 38.9 03/10/2020   MCV 89.2 03/10/2020   PLT 253 03/10/2020   Lab Results  Component Value Date   NA 142 03/10/2020   K 4.6 03/10/2020   CO2 30 03/10/2020   GLUCOSE 109 (H) 03/10/2020   BUN 24 03/10/2020   CREATININE 0.94 (H) 03/10/2020   BILITOT 0.4 03/10/2020   ALKPHOS 84 01/04/2016   AST 17 03/10/2020   ALT 12 03/10/2020   PROT 7.4 03/10/2020   ALBUMIN 4.3 01/04/2016   CALCIUM 10.2 03/10/2020   ANIONGAP 8 12/08/2014   Lab Results  Component Value Date   CHOL 190 03/10/2020   Lab Results  Component Value Date   HDL 51 03/10/2020   Lab Results  Component Value Date   LDLCALC 122 (H) 03/10/2020   Lab Results  Component Value Date   TRIG 75 03/10/2020   Lab Results  Component Value Date   CHOLHDL 3.7 03/10/2020   Lab Results  Component Value Date   HGBA1C 5.4 05/10/2018       Assessment & Plan:   Problem List Items Addressed This Visit      Cardiovascular and Mediastinum   Essential hypertension (Chronic)    -well controlled today -continue lisinopril-HCTZ -EKG today showed NSR      Relevant Orders   CBC with Differential/Platelet   CMP14+EGFR    Other Visit Diagnoses    Hyperlipidemia, unspecified hyperlipidemia type    -  Primary   Relevant Orders   Lipid Panel With LDL/HDL Ratio   Age-related osteoporosis without current pathological fracture       Relevant Orders   Vitamin D (25 hydroxy)       No orders of the defined types were placed in this encounter.    Noreene Larsson, NP

## 2020-05-19 NOTE — Assessment & Plan Note (Addendum)
-  well controlled today -continue lisinopril-HCTZ -EKG today showed NSR

## 2020-05-19 NOTE — Patient Instructions (Signed)
You were seen today for a BP check. Your BP was elevated at your last appointment. We completed a EKG today and that looked great. We will meet back up in 6 months for labs/physical. Please get fasting labs 2-3 days prior to that appointment.

## 2020-05-20 ENCOUNTER — Ambulatory Visit (INDEPENDENT_AMBULATORY_CARE_PROVIDER_SITE_OTHER): Payer: Medicare HMO | Admitting: Internal Medicine

## 2020-05-20 ENCOUNTER — Ambulatory Visit: Payer: Medicare HMO | Admitting: Family Medicine

## 2020-05-20 ENCOUNTER — Encounter: Payer: Self-pay | Admitting: Internal Medicine

## 2020-05-20 VITALS — BP 140/71 | HR 74 | Temp 97.3°F | Ht 62.0 in | Wt 139.8 lb

## 2020-05-20 DIAGNOSIS — R1013 Epigastric pain: Secondary | ICD-10-CM | POA: Diagnosis not present

## 2020-05-20 DIAGNOSIS — K219 Gastro-esophageal reflux disease without esophagitis: Secondary | ICD-10-CM | POA: Diagnosis not present

## 2020-05-20 MED ORDER — OMEPRAZOLE 20 MG PO CPDR: 20.0000 mg | DELAYED_RELEASE_CAPSULE | Freq: Two times a day (BID) | ORAL | 1 refills | Status: DC

## 2020-05-20 NOTE — Progress Notes (Signed)
Referring Provider: Kerri Perches, MD Primary Care Physician:  Kerri Perches, MD Primary GI:  Dr. Marletta Lor  Chief Complaint  Patient presents with  . Gastroesophageal Reflux    Taking protonix daily. Has occas Upper abd pain when Colleen [name] eats fried foods/spicey.    HPI:   Colleen [name] is a 75 y.o. female who presents to the clinic today for follow-up visit.  Her main complaint for me today is epigastric and right upper quadrant abdominal pain.  States this primarily occurs after eating.  Does note some foods make this worse including fried foods or spicy foods.  Takes Protonix daily and has been for years.  Colleen [name] states it does not seem to help her symptoms.  Denies any dysphagia or odynophagia.  Does note heartburn and reflux.  Recently underwent screening colonoscopy October 2021 which was WNL besides diverticulosis and internal hemorrhoids.  Colon recall 10 years for screening purposes.   Past Medical History:  Diagnosis Date  . GERD (gastroesophageal reflux disease)   . Glaucoma   . Hyperlipidemia   . Hypertension     Past Surgical History:  Procedure Laterality Date  . CHOLECYSTECTOMY    . CHONDROPLASTY Left 12/11/2014   Procedure: CHONDROPLASTY LEFT MEDIAL FEMORAL CONDYLE;  Surgeon: Vickki Hearing, MD;  Location: [facility];  Service: Orthopedics;  Laterality: Left;  . COLONOSCOPY WITH PROPOFOL N/A 02/27/2020   Procedure: COLONOSCOPY WITH PROPOFOL;  Surgeon: Lanelle Bal, DO;  Location: [facility];  Service: Endoscopy;  Laterality: N/A;  10:30  . KNEE ARTHROSCOPY WITH LATERAL MENISECTOMY Left 12/11/2014   Procedure: KNEE ARTHROSCOPY WITH LATERAL AND MEDIAL MENISECTOMY;  Surgeon: Vickki Hearing, MD;  Location: [facility];  Service: Orthopedics;  Laterality: Left;    Current Outpatient Medications  Medication Sig Dispense Refill  . acetaminophen (TYLENOL) 650 MG CR tablet Take 650 mg by mouth every 8 (eight) hours as needed for pain.     . Artificial Tear  Solution (SOOTHE XP) SOLN Place 1 application into both eyes 3 (three) times daily.     . Ascorbic Acid (VITAMIN C) 1000 MG tablet Take 1,000 mg by mouth 2 (two) times a week.     . Calcium Carb-Cholecalciferol 600-800 MG-UNIT TABS Take 1 tablet by mouth daily.  120 tablet 11  . Cholecalciferol (VITAMIN D3) 125 MCG (5000 UT) CAPS Take 5,000 Units by mouth daily.     . Coenzyme Q10 (COQ10) 200 MG CAPS Take 200 mg by mouth daily.    . diphenhydrAMINE (BENADRYL) 25 MG tablet Take 25 mg by mouth daily as needed for allergies.     Marland Kitchen latanoprost (XALATAN) 0.005 % ophthalmic solution Place 1 drop into both eyes at bedtime.  12  . lisinopril-hydrochlorothiazide (ZESTORETIC) 20-25 MG tablet Take 1 tablet by mouth daily. 90 tablet 1  . Multiple Vitamin (MULTIVITAMIN WITH MINERALS) TABS tablet Take 1 tablet by mouth daily.    . pantoprazole (PROTONIX) 40 MG tablet Take 1 tablet by mouth once daily 90 tablet 0  . rosuvastatin (CRESTOR) 5 MG tablet TAKE 1 TABLET EVERY MONDAY,WEDNESDAY, FRIDAY AND      SUNDAY (Patient taking differently: Take 5 mg by mouth See admin instructions. Take 1 tablet (5 mg) by mouth on Mondays, Wednesdays, Fridays, & Sundays in the morning.) 38 tablet 1  . alendronate (FOSAMAX) 70 MG tablet TAKE 1 TABLET EVERY 7 DAYS TAKE WITH A FULL GLASS OF  WATER ON AN EMPTY STOMACH (Patient not taking: Reported on 05/20/2020)  12 tablet 1   No current facility-administered medications for this visit.    Allergies as of 05/20/2020 - Review Complete 05/20/2020  Allergen Reaction Noted  . Aspirin Nausea Only 07/29/2008  . Fenofibrate Other (See Comments) 01/07/2016  . Statins Other (See Comments) 10/13/2013  . Flonase [fluticasone] Other (See Comments) 02/27/2020    Family History  Problem Relation Age of Onset  . Hypertension Mother   . Heart disease Mother 83       MI  . Hypertension Father   . Diabetes Father   . Stroke Father   . Hypertension Sister   . Diabetes Sister   . Multiple  sclerosis Sister   . Hypertension Sister   . Diabetes Sister   . Heart disease Sister   . Kidney disease Sister   . Hypertension Brother   . Schizophrenia Brother 44    Social History   Socioeconomic History  . Marital status: Married    Spouse name: Not on file  . Number of children: Not on file  . Years of education: Not on file  . Highest education level: 12th grade  Occupational History  . Not on file  Tobacco Use  . Smoking status: Former Games developer  . Smokeless tobacco: Never Used  Vaping Use  . Vaping Use: Never used  Substance and Sexual Activity  . Alcohol use: No  . Drug use: No  . Sexual activity: Never  Other Topics Concern  . Not on file  Social History Narrative  . Not on file   Social Determinants of Health   Financial Resource Strain: Low Risk   . Difficulty of Paying Living Expenses: Not hard at all  Food Insecurity: No Food Insecurity  . Worried About Programme researcher, broadcasting/film/video in the Last Year: Never true  . Ran Out of Food in the Last Year: Never true  Transportation Needs: No Transportation Needs  . Lack of Transportation (Medical): No  . Lack of Transportation (Non-Medical): No  Physical Activity: Sufficiently Active  . Days of Exercise per Week: 7 days  . Minutes of Exercise per Session: 30 min  Stress: No Stress Concern Present  . Feeling of Stress : Not at all  Social Connections: Moderately Isolated  . Frequency of Communication with Friends and Family: More than three times a week  . Frequency of Social Gatherings with Friends and Family: Twice a week  . Attends Religious Services: Never  . Active Member of Clubs or Organizations: No  . Attends Banker Meetings: Never  . Marital Status: Married    Subjective: Review of Systems  Constitutional: Negative for chills and fever.  HENT: Negative for congestion and hearing loss.   Eyes: Negative for blurred vision and double vision.  Respiratory: Negative for cough and shortness of  breath.   Cardiovascular: Negative for chest pain and palpitations.  Gastrointestinal: Positive for abdominal pain and heartburn. Negative for blood in stool, constipation, diarrhea, melena and vomiting.  Genitourinary: Negative for dysuria and urgency.  Musculoskeletal: Negative for joint pain and myalgias.  Skin: Negative for itching and rash.  Neurological: Negative for dizziness and headaches.  Psychiatric/Behavioral: Negative for depression. The patient is not nervous/anxious.      Objective: BP 140/71   Pulse 74   Temp (!) 97.3 F (36.3 C)   Ht 5\' 2"  (1.575 m)   Wt 139 lb 12.8 oz (63.4 kg)   BMI 25.57 kg/m  Physical Exam Constitutional:      Appearance: Normal appearance.  HENT:     Head: Normocephalic and atraumatic.  Eyes:     Extraocular Movements: Extraocular movements intact.     Conjunctiva/sclera: Conjunctivae normal.  Cardiovascular:     Rate and Rhythm: Normal rate and regular rhythm.  Pulmonary:     Effort: Pulmonary effort is normal.     Breath sounds: Normal breath sounds.  Abdominal:     General: Bowel sounds are normal.     Palpations: Abdomen is soft.  Musculoskeletal:        General: No swelling. Normal range of motion.     Cervical back: Normal range of motion and neck supple.  Skin:    General: Skin is warm and dry.     Coloration: Skin is not jaundiced.  Neurological:     General: No focal deficit present.     Mental Status: Colleen [name] is alert and oriented to person, place, and time.  Psychiatric:        Mood and Affect: Mood normal.        Behavior: Behavior normal.      Assessment: *Epigastric/RUQ pain *Chronic reflux  Plan: Discussed reflux in depth with patient today.  I will switch her PPI to omeprazole 20 mg twice daily and monitor for improvement.  Counseled on avoiding NSAIDs.  Colleen [name] will follow-up with me in 8 weeks time.  If Colleen [name] does not improve we may consider further evaluation with upper endoscopy.  Further recommendations to  follow  05/20/2020 9:23 AM   Disclaimer: This note was dictated with voice recognition software. Similar sounding words can inadvertently be transcribed and may not be corrected upon review.

## 2020-05-20 NOTE — Patient Instructions (Signed)
I am going to change your pantoprazole to omeprazole 20 mg twice daily.  I want you to take this 30 minutes before breakfast and 30 minutes before dinner.  Follow-up with me in 8 weeks.  If not improved, we may consider further evaluation with upper endoscopy.  At [facility] we value your feedback. You may receive a survey about your visit today. Please share your experience as we strive to create trusting relationships with our patients to provide genuine, compassionate, quality care.  We appreciate your understanding and patience as we review any laboratory studies, imaging, and other diagnostic tests that are ordered as we care for you. Our office policy is 5 business days for review of these results, and any emergent or urgent results are addressed in a timely manner for your best interest. If you do not hear from our office in 1 week, please contact us.   We also encourage the use of MyChart, which contains your medical information for your review as well. If you are not enrolled in this feature, an access code is on this after visit summary for your convenience. Thank you for allowing [place] to be involved in your care.  It was great to see you today!  I hope you have a great rest of your winter!!    Hennie Duos. Marletta Lor, D.O. Gastroenterology and Hepatology [facility]

## 2020-05-26 ENCOUNTER — Telehealth: Payer: Self-pay | Admitting: Internal Medicine

## 2020-05-26 NOTE — Telephone Encounter (Addendum)
I don't see the name of the medication the patient is referring to. I assume she is talking about the omeprazole that Dr. Marletta Lor sent to [facility]?  If so, please call pharmacy and find out what the issue is. ?coverage issue.

## 2020-05-26 NOTE — Telephone Encounter (Signed)
Noted. It was not clear based on documentation provided.

## 2020-05-26 NOTE — Telephone Encounter (Signed)
Before I sent this note to you I had phoned the [facility] and tried all 3 lines to get someone on the line and the phone kept referring me to call the number on the back of the pt's card. I also asked the pt what was going on and she couldn't tell me anything not even the medication they were talking about. The Rx they are speaking of is Omperazole. I will try again after I get through some of these other messages.

## 2020-05-26 NOTE — Telephone Encounter (Signed)
Pt saw Dr Marletta Lor on 05/20/2020 and she said that her pharmacy [facility] was having problems filling her prescription. She doesn't know the name of medication. Please call [phone]

## 2020-05-26 NOTE — Telephone Encounter (Signed)
Sent to refill pool

## 2020-05-27 ENCOUNTER — Telehealth: Payer: Self-pay | Admitting: Internal Medicine

## 2020-05-27 MED ORDER — OMEPRAZOLE 20 MG PO CPDR: 20.0000 mg | DELAYED_RELEASE_CAPSULE | Freq: Two times a day (BID) | ORAL | 1 refills | Status: DC

## 2020-05-27 NOTE — Telephone Encounter (Signed)
Please call patient regarding her prescription. [phone]

## 2020-05-27 NOTE — Telephone Encounter (Signed)
See other phone note

## 2020-05-27 NOTE — Telephone Encounter (Signed)
noted 

## 2020-05-27 NOTE — Telephone Encounter (Signed)
I returned the pts call from this morning to find out what was going on with her Rx and I told her I could not get through to [facility] to speak with someone and she stated she was on the phone with her insurance company and would call us back after she spoke with them regarding this. She states she couldn't understand what the pharmacists was explaining to her yesterday. I will try and call the pharmacy later today to see if I can speed this along.

## 2020-05-27 NOTE — Addendum Note (Signed)
Addended by: Tiffany Kocher on: 05/27/2020 10:23 AM   Modules accepted: Orders

## 2020-05-27 NOTE — Telephone Encounter (Signed)
Pt returned call and she stated her insurance company states the quantity was too many, but if the Dr verifies the 90 day supply the can fill it for her. (if not just reduce the number being prescribed to her so she can get her medication).

## 2020-05-27 NOTE — Telephone Encounter (Signed)
I'm not sure if me sending over new RX is going to be helpful but I wrote on new rx that bid necessary due to failing qd dosing. Verify 90 days with 1 refill.  I would anticipate pharmacy to send [place] a P.A.?

## 2020-05-31 NOTE — Telephone Encounter (Signed)
Copy of pt's script sent to [facility] with changes within the note.

## 2020-06-02 ENCOUNTER — Telehealth: Payer: Self-pay

## 2020-06-02 ENCOUNTER — Telehealth: Payer: Self-pay | Admitting: Internal Medicine

## 2020-06-02 NOTE — Telephone Encounter (Signed)
[phone] PATIENT CALLED AND SAID THAT HER [facility] ORDER [facility] IS NOT FILLING HER OMEPRAZOLE.   [facility]  NEED AUTHORIZATION

## 2020-06-02 NOTE — Telephone Encounter (Signed)
1.Phoned to [facility] spoke with Waunita Schooner and pt has been approved to receive Omeprazole for 05/08/2020 through 05/07/2021. The case number for this authorization is [id number]. They stated they will go ahead and fill for the patient.  2. Phoned the patient back and her phone just rang and went to vm but no room on it to leave the pt a message that it was approved. Will also put authorization note in notes.

## 2020-06-02 NOTE — Telephone Encounter (Signed)
Phoned the pt and her mailbox was full could not leave a message

## 2020-06-02 NOTE — Telephone Encounter (Signed)
Pt returned call I spoke with her and advised her once again that we are still catching up on authorizations from last week and once its approved they will release her medication. I will check on it today.

## 2020-06-02 NOTE — Telephone Encounter (Signed)
Pt has been approved to receive Omeprazole for the dates 05/08/2020 through 05/07/2021. The case number is [id number]. Tried to reach the patient but no room on her vm to leave message. [facility] will go ahead and send out medication to the patient.

## 2020-06-03 ENCOUNTER — Telehealth: Payer: Self-pay | Admitting: Internal Medicine

## 2020-06-03 DIAGNOSIS — H40053 Ocular hypertension, bilateral: Secondary | ICD-10-CM | POA: Diagnosis not present

## 2020-06-03 NOTE — Telephone Encounter (Signed)
Returned the pt's call and her vm is full. Could not leave a message.

## 2020-06-03 NOTE — Telephone Encounter (Signed)
Routing to Dr. Carver's nurse. 

## 2020-06-03 NOTE — Telephone Encounter (Signed)
[facility] not [facility] omep[facility]ole

## 2020-06-07 ENCOUNTER — Other Ambulatory Visit: Payer: Self-pay

## 2020-06-07 MED ORDER — ROSUVASTATIN CALCIUM 5 MG PO TABS
5.0000 mg | ORAL_TABLET | ORAL | 2 refills | Status: DC
Start: 2020-06-07 — End: 2020-11-16

## 2020-06-07 NOTE — Telephone Encounter (Signed)
Phoned the pt to inquire if she had received her medication. States she just verified with them over the weekend her information and haven't been to the mailbox yet. (states they were going to send it)

## 2020-06-08 ENCOUNTER — Telehealth: Payer: Self-pay

## 2020-06-08 NOTE — Telephone Encounter (Signed)
Pt has been authorized to have Omperazole 20 MG caps DR  (QUANITY LIMITS) FROM 05/08/2020 THROUGH 05/07/2021

## 2020-06-28 DIAGNOSIS — H40053 Ocular hypertension, bilateral: Secondary | ICD-10-CM | POA: Diagnosis not present

## 2020-06-28 DIAGNOSIS — H2513 Age-related nuclear cataract, bilateral: Secondary | ICD-10-CM | POA: Diagnosis not present

## 2020-07-13 ENCOUNTER — Telehealth: Payer: Self-pay | Admitting: *Deleted

## 2020-07-13 MED ORDER — OMEPRAZOLE 20 MG PO CPDR: 20.0000 mg | DELAYED_RELEASE_CAPSULE | Freq: Two times a day (BID) | ORAL | 1 refills | Status: DC

## 2020-07-13 NOTE — Addendum Note (Signed)
Addended by: Gelene Mink on: 07/13/2020 01:26 PM   Modules accepted: Orders

## 2020-07-13 NOTE — Telephone Encounter (Signed)
Done

## 2020-07-13 NOTE — Telephone Encounter (Signed)
Received refill request from [facility] for Omeprazole 20mg  capsule

## 2020-07-14 ENCOUNTER — Other Ambulatory Visit: Payer: Self-pay

## 2020-07-14 MED ORDER — LISINOPRIL-HYDROCHLOROTHIAZIDE 20-25 MG PO TABS: 1.0000 | ORAL_TABLET | Freq: Every day | ORAL | 1 refills | Status: DC

## 2020-07-19 ENCOUNTER — Ambulatory Visit: Payer: Medicare HMO | Admitting: Gastroenterology

## 2020-07-21 ENCOUNTER — Telehealth: Payer: Self-pay

## 2020-07-21 NOTE — Telephone Encounter (Signed)
Pt said someone called, but she is not sure who --she stated a bone Scan--but I dont see an order .

## 2020-07-22 ENCOUNTER — Other Ambulatory Visit: Payer: Self-pay

## 2020-07-22 DIAGNOSIS — Z780 Asymptomatic menopausal state: Secondary | ICD-10-CM

## 2020-07-22 NOTE — Telephone Encounter (Signed)
Its been 4 years since her last- she is due.. Order entered

## 2020-08-02 NOTE — Telephone Encounter (Signed)
Appt 08-12-20 @ 10:00, Pt notified

## 2020-08-12 ENCOUNTER — Other Ambulatory Visit: Payer: Self-pay

## 2020-08-12 ENCOUNTER — Ambulatory Visit (HOSPITAL_COMMUNITY)
Admission: RE | Admit: 2020-08-12 | Discharge: 2020-08-12 | Disposition: A | Discharge status: Home / Self Care | Payer: Medicare HMO | Source: Ambulatory Visit | Attending: Family Medicine | Admitting: Family Medicine

## 2020-08-12 DIAGNOSIS — M85851 Other specified disorders of bone density and structure, right thigh: Secondary | ICD-10-CM | POA: Diagnosis not present

## 2020-08-12 DIAGNOSIS — Z780 Asymptomatic menopausal state: Secondary | ICD-10-CM | POA: Diagnosis not present

## 2020-08-23 ENCOUNTER — Other Ambulatory Visit: Payer: Self-pay

## 2020-08-23 ENCOUNTER — Ambulatory Visit: Payer: Medicare HMO | Admitting: Gastroenterology

## 2020-08-23 ENCOUNTER — Encounter: Payer: Self-pay | Admitting: Gastroenterology

## 2020-08-23 VITALS — BP 149/74 | HR 71 | Temp 96.9°F | Ht 62.0 in | Wt 140.0 lb

## 2020-08-23 DIAGNOSIS — R1013 Epigastric pain: Secondary | ICD-10-CM

## 2020-08-23 DIAGNOSIS — K219 Gastro-esophageal reflux disease without esophagitis: Secondary | ICD-10-CM | POA: Diagnosis not present

## 2020-08-23 DIAGNOSIS — K5900 Constipation, unspecified: Secondary | ICD-10-CM

## 2020-08-23 MED ORDER — POLYETHYLENE GLYCOL 3350 17 GM/SCOOP PO POWD: ORAL | 5 refills | Status: AC

## 2020-08-23 NOTE — Patient Instructions (Signed)
1. Start Miralax one capful twice daily until you have a good soft bowel movement.  After that, monitor each day for effective bowel movement.  If no effective bowel movement, then take 1 capful of MiraLAX.  I sent a new prescription to [facility] see if your insurance will cover, if not you will have to buy over-the-counter. 2. Continue omeprazole 20 mg twice daily. 3. Return to the office in 4 months.  Please call sooner if you continue to have issues with constipation.

## 2020-08-23 NOTE — Progress Notes (Signed)
Primary Care Physician: Colleen Perches, MD  Primary Gastroenterologist:  Colleen Duos. Marletta Lor, DO   Chief Complaint  Patient presents with  . Constipation    Sometimes goes 2 days without bm    HPI: Colleen [name] is a 75 y.o. female here for follow-up.  She was seen back in January 2022 with complaints of epigastric/right upper quadrant pain primarily after eating, reflux symptoms.  Her pantoprazole was switched to omeprazole 20 mg twice daily.  She presents back for an 8-week follow-up.  Patient states her abdominal pain has resolved.  Denies any reflux symptoms.  She is concerned about constipation today.  States she always has had issues with chronic constipation.  Seems to get worse over the years.  She wonders if any of her medications has contributed.  Currently having BM twice per week.  Stools can be hard. Having to strain at times. No melena, brbpr.  Nausea or vomiting.  Appetite is good.   Current Outpatient Medications  Medication Sig Dispense Refill  . acetaminophen (TYLENOL) 650 MG CR tablet Take 650 mg by mouth every 8 (eight) hours as needed for pain.     . Artificial Tear Solution (SOOTHE XP) SOLN Place 1 application into both eyes 3 (three) times daily.     . Ascorbic Acid (VITAMIN C) 1000 MG tablet Take 1,000 mg by mouth 2 (two) times a week.     . Calcium Carb-Cholecalciferol 600-800 MG-UNIT TABS Take 1 tablet by mouth daily.  120 tablet 11  . Cholecalciferol (VITAMIN D3) 125 MCG (5000 UT) CAPS Take 5,000 Units by mouth daily.     . Coenzyme Q10 (COQ10) 200 MG CAPS Take 200 mg by mouth daily.    . diphenhydrAMINE (BENADRYL) 25 MG tablet Take 25 mg by mouth daily as needed for allergies.     Marland Kitchen latanoprost (XALATAN) 0.005 % ophthalmic solution Place 1 drop into both eyes at bedtime.  12  . lisinopril-hydrochlorothiazide (ZESTORETIC) 20-25 MG tablet Take 1 tablet by mouth daily. 90 tablet 1  . Multiple Vitamin (MULTIVITAMIN WITH MINERALS) TABS tablet Take 1  tablet by mouth daily.    Marland Kitchen omeprazole (PRILOSEC) 20 MG capsule Take 1 capsule (20 mg total) by mouth 2 (two) times daily before a meal. 180 capsule 1  . rosuvastatin (CRESTOR) 5 MG tablet Take 1 tablet (5 mg total) by mouth See admin instructions. Take 1 tablet (5 mg) by mouth on Mondays, Wednesdays, Fridays, & Sundays in the morning. 38 tablet 2   No current facility-administered medications for this visit.    Allergies as of 08/23/2020 - Review Complete 08/23/2020  Allergen Reaction Noted  . Aspirin Nausea Only 07/29/2008  . Fenofibrate Other (See Comments) 01/07/2016  . Statins Other (See Comments) 10/13/2013  . Flonase [fluticasone] Other (See Comments) 02/27/2020    ROS:  General: Negative for anorexia, weight loss, fever, chills, fatigue, weakness. ENT: Negative for hoarseness, difficulty swallowing , nasal congestion. CV: Negative for chest pain, angina, palpitations, dyspnea on exertion, peripheral edema.  Respiratory: Negative for dyspnea at rest, dyspnea on exertion, cough, sputum, wheezing.  GI: See history of present illness. GU:  Negative for dysuria, hematuria, urinary incontinence, urinary frequency, nocturnal urination.  Endo: Negative for unusual weight change.    Physical Examination:   BP (!) 149/74   Pulse 71   Temp (!) 96.9 F (36.1 C) (Temporal)   Ht 5\' 2"  (1.575 m)   Wt 140 lb (63.5 kg)   BMI 25.61  kg/m   General: Well-nourished, well-developed in no acute distress.  Eyes: No icterus. Mouth: masked  Abdomen: Bowel sounds are normal, nontender, nondistended, no hepatosplenomegaly or masses, no abdominal bruits or hernia , no rebound or guarding.   Extremities: No lower extremity edema. No clubbing or deformities. Neuro: Alert and oriented x 4   Skin: Warm and dry, no jaundice.   Psych: Alert and cooperative, normal mood and affect.  Labs:  Lab Results  Component Value Date   CREATININE 0.94 (H) 03/10/2020   BUN 24 03/10/2020   NA 142 03/10/2020    K 4.6 03/10/2020   CL 106 03/10/2020   CO2 30 03/10/2020   Lab Results  Component Value Date   ALT 12 03/10/2020   AST 17 03/10/2020   ALKPHOS 84 01/04/2016   BILITOT 0.4 03/10/2020   Lab Results  Component Value Date   WBC 4.5 03/10/2020   HGB 13.1 03/10/2020   HCT 38.9 03/10/2020   MCV 89.2 03/10/2020   PLT 253 03/10/2020    Imaging Studies: DG Bone Density  Result Date: 08/12/2020 EXAM: DUAL X-RAY ABSORPTIOMETRY (DXA) FOR BONE MINERAL DENSITY IMPRESSION: Your patient Colleen [name] completed a BMD test on 08/12/2020 using the [employer] DXA System (software version: 14.10) manufactured by [employer]. The following summarizes the results of our evaluation. Technologist: [facility] PATIENT BIOGRAPHICAL: Name: Colleen, [name] Patient ID: [id number] Birth Date: 1945-08-08 Height: 60.5 in. Gender: Female Exam Date: 08/12/2020 Weight: 139.8 lbs. Indications: Follow up Osteopenia, Post Menopausal, Height Loss, Parent Hip Fracture, History of Fracture (Adult), Family Hist. (Parent hip fracture) Fractures: Wrist Treatments: Multivitamin, Vitamin D DENSITOMETRY RESULTS: Site          Region     Measured Date Measured Age WHO Classification Young Adult T-score BMD         %Change vs. Previous Significant Change (*) DualFemur Neck Right 08/12/2020 73.6 Osteopenia -1.4 0.845 g/cm2 1.7% - DualFemur Neck Right 11/23/2016 69.9 Osteopenia -1.5 0.831 g/cm2 -1.2% - DualFemur Neck Right 06/16/2013 66.4 Osteopenia -1.4 0.841 g/cm2 -5.5% Yes DualFemur Neck Right 12/13/2009 62.9 Osteopenia -1.1 0.890 g/cm2 0.8% - DualFemur Neck Right 09/05/2007 60.6 Osteopenia -1.1 0.883 g/cm2 - - DualFemur Total Mean 08/12/2020 73.6 Normal -0.5 0.942 g/cm2 -0.3% - DualFemur Total Mean 11/23/2016 69.9 - - 0.945 g/cm2 -3.4% Yes DualFemur Total Mean 06/16/2013 66.4 Normal -0.2 0.978 g/cm2 -0.3% - DualFemur Total Mean 12/13/2009 62.9 Normal -0.2 0.981 g/cm2 1.0% - DualFemur Total Mean 09/05/2007 60.6 Normal -0.3 0.971 g/cm2 -  - Right Forearm Radius 33% 08/12/2020 73.6 Normal 0.5 0.750 g/cm2 2.5% - Right Forearm Radius 33% 11/23/2016 69.9 Normal 0.2 0.731 g/cm2 -1.8% - Right Forearm Radius 33% 09/05/2007 60.6 Normal 0.4 0.745 g/cm2 - - ASSESSMENT: The BMD measured at Femur Neck Right is 0.845 g/cm2 with a T-score of -1.4. This patient is considered OSTEOPENIC according to World Health Organization [facility]) criteria. The scan quality is good. Compared with the prior study on 11/23/2016, the BMD of the total mean shows no statistically significant change. Lumbar spine was excluded due to advanced degenerative changes. World Science writer [facility]) criteria for post-menopausal, Caucasian Women: Normal:       T-score at or above -1 SD Osteopenia:   T-score between -1 and -2.5 SD Osteoporosis: T-score at or below -2.5 SD RECOMMENDATIONS: 1. All patients should optimize calcium and vitamin D intake. 2. Consider FDA-approved medical therapies in postmenopausal women and med aged 22 years and older, based on the following: a. A hip  or vertebral (clinical or morphometric) fracture b. T-score < -2.5 at the femoral neck or spine after appropriate evaluation to exclude secondary causes c. Low bone mass (T-score between -1.0 and -2.5 at the femoral neck or spine) and a 10-year probability of a hip fracture > 3% or a 10-year probability of a major osteoporosis-related fracture > 20% based on the [place]-adapted WHO algorithm d. Clinician judgment and/or patient preferences may indicate treatment for people with 10-year fracture probabilities above or below these levels FOLLOW-UP: People with diagnosed cases of osteoporosis or at high risk for fracture should have regular bone mineral density tests. For patients eligible for Medicare, routine testing is allowed once every 2 years. The testing frequency can be increased to one year for patients who have rapidly progressing disease, those who are receiving or discontinuing medical therapy to restore bone mass,  or have additional risk factors. I have reviewed this report, and agree with the above findings. Mark A. Tyron Russell, M.D. [facility] Your patient Colleen [name] completed a FRAX assessment on 08/12/2020 using the [employer] DXA System (analysis version: 14.10) manufactured by [employer]. The following summarizes the results of our evaluation. PATIENT BIOGRAPHICAL: Name: Colleen, [name] Patient ID: [id number] Birth Date: 05-06-1946 Height:    60.5 in. Gender:     Female    Age:        74.6       Weight:    139.8 lbs. Ethnicity:  Black                            Exam Date: 08/12/2020 FRAX* RESULTS:  (version: 3.5) 10-year Probability of Fracture1 Major Osteoporotic Fracture2 Hip Fracture 11.6% 4.1% Population: [place] (Black) Risk Factors: History of Fracture (Adult), Family Hist. (Parent hip fracture) Based on DualFemur (Right) Neck BMD 1 -The 10-year probability of fracture may be lower than reported if the patient has received treatment. 2 -Major Osteoporotic Fracture: Clinical Spine, Forearm, Hip or Shoulder *FRAX is a Armed forces logistics/support/administrative officer of the [employer] of [employer] for Metabolic Bone Disease, a World Science writer (WHO) [employer]. ASSESSMENT: The probability of a major osteoporotic fracture is 11.6% within the next ten years. The probability of a hip fracture is 4.1% within the next ten years. I have reviewed this report and agree with the above findings. Mark A. Tyron Russell, M.D. [facility] Electronically Signed   By: Ulyses Southward M.D.   On: 08/12/2020 11:56   Assessment:  75 year old female with history of GERD, postprandial epigastric/right upper quadrant pain presenting for follow-up.  Her abdominal pain has resolved.  Switching to omeprazole 20 mg twice daily seem to help. Patient declines upper endoscopy since she is feeling better.  Chronic constipation, progressive.  Colonoscopy up-to-date.    Plan:  1. Continue omeprazole 20 mg twice daily for  now.  At next office visit, continue decreasing to once daily dosing if tolerated. 2. Add MiraLAX 17 g twice daily until soft stool.  Then continue once daily as needed to maintain regular BMs. 3. Return to the office in 4 months or call sooner if needed.

## 2020-08-23 NOTE — Progress Notes (Signed)
CC'ED TO PCP 

## 2020-09-06 DIAGNOSIS — H01001 Unspecified blepharitis right upper eyelid: Secondary | ICD-10-CM | POA: Diagnosis not present

## 2020-09-06 DIAGNOSIS — H01004 Unspecified blepharitis left upper eyelid: Secondary | ICD-10-CM | POA: Diagnosis not present

## 2020-09-06 DIAGNOSIS — H01002 Unspecified blepharitis right lower eyelid: Secondary | ICD-10-CM | POA: Diagnosis not present

## 2020-09-06 DIAGNOSIS — H01005 Unspecified blepharitis left lower eyelid: Secondary | ICD-10-CM | POA: Diagnosis not present

## 2020-09-06 DIAGNOSIS — H40053 Ocular hypertension, bilateral: Secondary | ICD-10-CM | POA: Diagnosis not present

## 2020-09-06 DIAGNOSIS — H2513 Age-related nuclear cataract, bilateral: Secondary | ICD-10-CM | POA: Diagnosis not present

## 2020-11-09 DIAGNOSIS — E785 Hyperlipidemia, unspecified: Secondary | ICD-10-CM | POA: Diagnosis not present

## 2020-11-09 DIAGNOSIS — I10 Essential (primary) hypertension: Secondary | ICD-10-CM | POA: Diagnosis not present

## 2020-11-09 DIAGNOSIS — M810 Age-related osteoporosis without current pathological fracture: Secondary | ICD-10-CM | POA: Diagnosis not present

## 2020-11-10 LAB — CBC WITH DIFFERENTIAL/PLATELET
Basophils Absolute: 0 10*3/uL (ref 0.0–0.2)
Basos: 1 %
EOS (ABSOLUTE): 0.1 10*3/uL (ref 0.0–0.4)
Eos: 3 %
Hematocrit: 35.8 % (ref 34.0–46.6)
Hemoglobin: 12.1 g/dL (ref 11.1–15.9)
Immature Grans (Abs): 0 10*3/uL (ref 0.0–0.1)
Immature Granulocytes: 0 %
Lymphocytes Absolute: 2.2 10*3/uL (ref 0.7–3.1)
Lymphs: 47 %
MCH: 30.6 pg (ref 26.6–33.0)
MCHC: 33.8 g/dL (ref 31.5–35.7)
MCV: 91 fL (ref 79–97)
Monocytes Absolute: 0.3 10*3/uL (ref 0.1–0.9)
Monocytes: 7 %
Neutrophils Absolute: 1.9 10*3/uL (ref 1.4–7.0)
Neutrophils: 42 %
Platelets: 210 10*3/uL (ref 150–450)
RBC: 3.95 x10E6/uL (ref 3.77–5.28)
RDW: 12.8 % (ref 11.7–15.4)
WBC: 4.6 10*3/uL (ref 3.4–10.8)

## 2020-11-10 LAB — CMP14+EGFR
ALT: 18 IU/L (ref 0–32)
AST: 22 IU/L (ref 0–40)
Albumin/Globulin Ratio: 1.7 (ref 1.2–2.2)
Albumin: 4.3 g/dL (ref 3.7–4.7)
Alkaline Phosphatase: 92 IU/L (ref 44–121)
BUN/Creatinine Ratio: 22 (ref 12–28)
BUN: 21 mg/dL (ref 8–27)
Bilirubin Total: 0.3 mg/dL (ref 0.0–1.2)
CO2: 23 mmol/L (ref 20–29)
Calcium: 9.4 mg/dL (ref 8.7–10.3)
Chloride: 104 mmol/L (ref 96–106)
Creatinine, Ser: 0.94 mg/dL (ref 0.57–1.00)
Globulin, Total: 2.6 g/dL (ref 1.5–4.5)
Glucose: 100 mg/dL — ABNORMAL HIGH (ref 65–99)
Potassium: 4.1 mmol/L (ref 3.5–5.2)
Sodium: 140 mmol/L (ref 134–144)
Total Protein: 6.9 g/dL (ref 6.0–8.5)
eGFR: 64 mL/min/{1.73_m2} (ref >59)

## 2020-11-10 LAB — LIPID PANEL WITH LDL/HDL RATIO
Cholesterol, Total: 158 mg/dL (ref 100–199)
HDL: 57 mg/dL (ref >39)
LDL Chol Calc (NIH): 90 mg/dL (ref 0–99)
LDL/HDL Ratio: 1.6 ratio (ref 0.0–3.2)
Triglycerides: 50 mg/dL (ref 0–149)
VLDL Cholesterol Cal: 11 mg/dL (ref 5–40)

## 2020-11-10 LAB — VITAMIN D 25 HYDROXY (VIT D DEFICIENCY, FRACTURES): Vit D, 25-Hydroxy: 53.1 ng/mL (ref 30.0–100.0)

## 2020-11-10 NOTE — Progress Notes (Signed)
Labs look great.

## 2020-11-16 ENCOUNTER — Encounter: Payer: Self-pay | Admitting: Family Medicine

## 2020-11-16 ENCOUNTER — Other Ambulatory Visit: Payer: Self-pay

## 2020-11-16 ENCOUNTER — Telehealth: Payer: Self-pay

## 2020-11-16 ENCOUNTER — Ambulatory Visit (INDEPENDENT_AMBULATORY_CARE_PROVIDER_SITE_OTHER): Payer: Medicare HMO | Admitting: Family Medicine

## 2020-11-16 VITALS — BP 129/72 | HR 67 | Temp 98.8°F | Resp 18 | Ht 62.0 in | Wt 136.0 lb

## 2020-11-16 DIAGNOSIS — M25551 Pain in right hip: Secondary | ICD-10-CM | POA: Diagnosis not present

## 2020-11-16 DIAGNOSIS — E785 Hyperlipidemia, unspecified: Secondary | ICD-10-CM | POA: Diagnosis not present

## 2020-11-16 DIAGNOSIS — Z1231 Encounter for screening mammogram for malignant neoplasm of breast: Secondary | ICD-10-CM | POA: Diagnosis not present

## 2020-11-16 DIAGNOSIS — M542 Cervicalgia: Secondary | ICD-10-CM

## 2020-11-16 DIAGNOSIS — I10 Essential (primary) hypertension: Secondary | ICD-10-CM | POA: Diagnosis not present

## 2020-11-16 DIAGNOSIS — M1712 Unilateral primary osteoarthritis, left knee: Secondary | ICD-10-CM

## 2020-11-16 DIAGNOSIS — M8580 Other specified disorders of bone density and structure, unspecified site: Secondary | ICD-10-CM | POA: Diagnosis not present

## 2020-11-16 DIAGNOSIS — K219 Gastro-esophageal reflux disease without esophagitis: Secondary | ICD-10-CM

## 2020-11-16 MED ORDER — KETOROLAC TROMETHAMINE 60 MG/2ML IM SOLN
60.0000 mg | Freq: Once | INTRAMUSCULAR | Status: AC
  Administered 2020-11-16: 60 mg via INTRAMUSCULAR

## 2020-11-16 MED ORDER — ROSUVASTATIN CALCIUM 5 MG PO TABS: 5.0000 mg | ORAL_TABLET | ORAL | 2 refills | Status: DC

## 2020-11-16 MED ORDER — METHYLPREDNISOLONE ACETATE 80 MG/ML IJ SUSP
80.0000 mg | Freq: Once | INTRAMUSCULAR | Status: AC
  Administered 2020-11-16: 80 mg via INTRAMUSCULAR

## 2020-11-16 MED ORDER — PREDNISONE 10 MG PO TABS: 10.0000 mg | ORAL_TABLET | Freq: Two times a day (BID) | ORAL | 0 refills | Status: DC

## 2020-11-16 NOTE — Assessment & Plan Note (Signed)
Controlled, no change in medication DASH diet and commitment to daily physical activity for a minimum of 30 minutes discussed and encouraged, as a part of hypertension management. The importance of attaining a healthy weight is also discussed.  BP/Weight 11/16/2020 08/23/2020 05/20/2020 05/19/2020 03/16/2020 02/27/2020 02/18/2020  Systolic BP 129 149 140 109 150 125 -  Diastolic BP 72 74 71 67 70 69 -  Wt. (Lbs) 136 140 139.8 140 141 149 140.6  BMI 24.87 25.61 25.57 25.61 25.79 28.15 26.14

## 2020-11-16 NOTE — Assessment & Plan Note (Signed)
Uncontrolled.Toradol and depo medrol administered IM in the office , to be followed by a short course of oral prednisone   

## 2020-11-16 NOTE — Assessment & Plan Note (Signed)
Intolerant of oral agents and refusing parenteral, weight bearing exercise, and daily calcium with D

## 2020-11-16 NOTE — Patient Instructions (Addendum)
Annual exam in office with MD November 10 or after, call if you need me sooner.  Please schedule mammogram at checkout which is due this month.  You need  your 2nd covid booster and also your shingrix vaccines, nurse please clarify  with patient, she said she already had them   Depo-Medrol 40 mg IM in the office today and Toradol 60 mg IM to be followed by a prednisone for right leg and hip pain.  Labs are excellent.  Blood pressure is at goal and normal.  Work on  urinary incontinence with exercises and as we discussed and also going to the bathroom on a regular schedule.  You need to commit to exercises which involve weightbearing like walking for 30 minutes 5 days a week to strengthen your bones.  You also need to commit to continue taking calcium with vitamin D daily

## 2020-11-16 NOTE — Telephone Encounter (Signed)
Patient called back and is needing a refill on rosuvastatin ph# [phone]

## 2020-11-16 NOTE — Assessment & Plan Note (Signed)
Controlled, no change in medication  

## 2020-11-16 NOTE — Telephone Encounter (Signed)
Rx refilled.

## 2020-11-16 NOTE — Progress Notes (Signed)
   Colleen [name]     MRN: [id number]      DOB: Apr 08, 1946   HPI Colleen [name] is here for follow up and re-evaluation of chronic medical conditions, medication management and review of any available recent lab and radiology data.  Preventive health is updated, specifically  Cancer screening and Immunization.   Questions or concerns regarding consultations or procedures which the PT has had in the interim are  addressed. The PT denies any adverse reactions to current medications since the last visit.  C/o increased right neck and hip pain in past several weeks  ROS Denies recent fever or chills. Denies sinus pressure, nasal congestion, ear pain or sore throat. Denies chest congestion, productive cough or wheezing. Denies chest pains, palpitations and leg swelling Denies abdominal pain, nausea, vomiting,diarrhea or constipation.   Denies dysuria, frequency, hesitancy or incontinence.  Denies headaches, seizures, numbness, or tingling. Denies depression, anxiety or insomnia. Denies skin break down or rash.   PE  BP 129/72 (BP Location: Right Arm, Patient Position: Sitting, Cuff Size: Large)   Pulse 67   Temp 98.8 F (37.1 C)   Resp 18   Ht 5\' 2"  (1.575 m)   Wt 136 lb (61.7 kg)   SpO2 97%   BMI 24.87 kg/m   Patient alert and oriented and in no cardiopulmonary distress.  HEENT: No facial asymmetry, EOMI,     Neck decreased ROM .  Chest: Clear to auscultation bilaterally.  CVS: S1, S2 no murmurs, no S3.Regular rate.  ABD: Soft non tender.   Ext: No edema  MS: Adequate ROM spine, reduced in right hip and knee.  Skin: Intact, no ulcerations or rash noted.  Psych: Good eye contact, normal affect. Memory intact not anxious or depressed appearing.  CNS: CN 2-12 intact, power,  normal throughout.no focal deficits noted.   Assessment & Plan  Hip pain, right Uncontrolled.Toradol and depo medrol administered IM in the office , to be followed by a short course of oral  prednisone    Hyperlipidemia LDL goal <100 Hyperlipidemia:Low fat diet discussed and encouraged.   Lipid Panel  Lab Results  Component Value Date   CHOL 158 11/09/2020   HDL 57 11/09/2020   LDLCALC 90 11/09/2020   TRIG 50 11/09/2020   CHOLHDL 3.7 03/10/2020  Controlled, no change in medication      Neck pain on right side Uncontrolled.Toradol and depo medrol administered IM in the office , to be followed by a short course of oral prednisone    Osteopenia Intolerant of oral agents and refusing parenteral, weight bearing exercise, and daily calcium with D  GERD Controlled, no change in medication   Essential hypertension Controlled, no change in medication DASH diet and commitment to daily physical activity for a minimum of 30 minutes discussed and encouraged, as a part of hypertension management. The importance of attaining a healthy weight is also discussed.  BP/Weight 11/16/2020 08/23/2020 05/20/2020 05/19/2020 03/16/2020 02/27/2020 02/18/2020  Systolic BP 129 149 140 109 150 125 -  Diastolic BP 72 74 71 67 70 69 -  Wt. (Lbs) 136 140 139.8 140 141 149 140.6  BMI 24.87 25.61 25.57 25.61 25.79 28.15 26.14

## 2020-11-16 NOTE — Assessment & Plan Note (Signed)
Hyperlipidemia:Low fat diet discussed and encouraged.   Lipid Panel  Lab Results  Component Value Date   CHOL 158 11/09/2020   HDL 57 11/09/2020   LDLCALC 90 11/09/2020   TRIG 50 11/09/2020   CHOLHDL 3.7 03/10/2020  Controlled, no change in medication

## 2020-11-17 ENCOUNTER — Telehealth: Payer: Self-pay | Admitting: Family Medicine

## 2020-11-17 NOTE — Telephone Encounter (Signed)
Sent to [facility] for the record to be sent

## 2020-11-17 NOTE — Telephone Encounter (Signed)
Pt called to advised that she had both shingle vaccines at the [place] on [address]

## 2020-12-13 ENCOUNTER — Other Ambulatory Visit: Payer: Self-pay | Admitting: Family Medicine

## 2020-12-13 ENCOUNTER — Other Ambulatory Visit: Payer: Self-pay | Admitting: Gastroenterology

## 2020-12-24 ENCOUNTER — Encounter: Payer: Self-pay | Admitting: Gastroenterology

## 2020-12-24 ENCOUNTER — Other Ambulatory Visit: Payer: Self-pay

## 2020-12-24 ENCOUNTER — Ambulatory Visit (INDEPENDENT_AMBULATORY_CARE_PROVIDER_SITE_OTHER): Payer: Medicare HMO | Admitting: Gastroenterology

## 2020-12-24 VITALS — BP 132/77 | HR 62 | Temp 97.7°F | Ht 62.0 in | Wt 138.0 lb

## 2020-12-24 DIAGNOSIS — K5900 Constipation, unspecified: Secondary | ICD-10-CM | POA: Diagnosis not present

## 2020-12-24 DIAGNOSIS — K219 Gastro-esophageal reflux disease without esophagitis: Secondary | ICD-10-CM | POA: Diagnosis not present

## 2020-12-24 NOTE — Progress Notes (Signed)
Primary Care Physician: Kerri Perches, MD  Primary Gastroenterologist:  Hennie Duos. Marletta Lor, DO   Chief Complaint  Patient presents with   Gastroesophageal Reflux    Doing "okay"   Constipation    Has a BM twice a week and takes miralax twice a week   Abdominal Pain    Will occas have epigastric pain    HPI: Colleen [name] is a 75 y.o. female here for follow-up.  Patient was last seen in April.  Patient has history of GERD, constipation, epigastric pain.  Doing well.  Appetite is good.  Having a bowel movement about twice a week when she takes MiraLAX.  Only takes MiraLAX twice a week because she felt like she was losing weight.  She denies diarrhea.  Denies frequent stools.  Her weight has been stable over the past 1 year.  Fluctuates between 135 and 140 pounds.  Occasional heartburn, taking omeprazole twice a day.  Denies abdominal pain.  No NSAID use.  Current Outpatient Medications  Medication Sig Dispense Refill   acetaminophen (TYLENOL) 650 MG CR tablet Take 650 mg by mouth every 8 (eight) hours as needed for pain.      Artificial Tear Solution (SOOTHE XP) SOLN Place 1 application into both eyes 3 (three) times daily.      Ascorbic Acid (VITAMIN C) 1000 MG tablet Take 1,000 mg by mouth 2 (two) times a week.      Calcium Carb-Cholecalciferol 600-800 MG-UNIT TABS Take 1 tablet by mouth daily.  120 tablet 11   Cholecalciferol (VITAMIN D3) 125 MCG (5000 UT) CAPS Take 5,000 Units by mouth daily.      Coenzyme Q10 (COQ10) 200 MG CAPS Take 200 mg by mouth daily.     diphenhydrAMINE (BENADRYL) 25 MG tablet Take 25 mg by mouth daily as needed for allergies.      latanoprost (XALATAN) 0.005 % ophthalmic solution Place 1 drop into both eyes at bedtime.  12   lisinopril-hydrochlorothiazide (ZESTORETIC) 20-25 MG tablet TAKE 1 TABLET EVERY DAY 90 tablet 1   Multiple Vitamin (MULTIVITAMIN WITH MINERALS) TABS tablet Take 1 tablet by mouth daily.     omeprazole (PRILOSEC) 20 MG  capsule TAKE 1 CAPSULE (20 MG TOTAL) BY MOUTH 2 (TWO) TIMES DAILY BEFORE  MEALS 180 capsule 3   polyethylene glycol powder (MIRALAX) 17 GM/SCOOP powder Take one capful twice daily until soft bowel movement, then continue once daily as needed to maintain regular bowel movement. (Patient taking differently: Twice a week) 527 g 5   rosuvastatin (CRESTOR) 5 MG tablet Take 1 tablet (5 mg total) by mouth See admin instructions. Take 1 tablet (5 mg) by mouth on Mondays, Wednesdays, Fridays, & Sundays in the morning. 38 tablet 2   No current facility-administered medications for this visit.    Allergies as of 12/24/2020 - Review Complete 12/24/2020  Allergen Reaction Noted   Aspirin Nausea Only 07/29/2008   Fenofibrate Other (See Comments) 01/07/2016   Statins Other (See Comments) 10/13/2013   Flonase [fluticasone] Other (See Comments) 02/27/2020    ROS:  General: Negative for anorexia, weight loss, fever, chills, fatigue, weakness. ENT: Negative for hoarseness, difficulty swallowing , nasal congestion. CV: Negative for chest pain, angina, palpitations, dyspnea on exertion, peripheral edema.  Respiratory: Negative for dyspnea at rest, dyspnea on exertion, cough, sputum, wheezing.  GI: See history of present illness. GU:  Negative for dysuria, hematuria, urinary incontinence, urinary frequency, nocturnal urination.  Endo: Negative for unusual weight change.  Physical Examination:   BP 132/77   Pulse 62   Temp 97.7 F (36.5 C)   Ht 5\' 2"  (1.575 m)   Wt 138 lb (62.6 kg)   BMI 25.24 kg/m   General: Well-nourished, well-developed in no acute distress.  Eyes: No icterus. Mouth:masked Abdomen: Bowel sounds are normal, nontender, nondistended, no hepatosplenomegaly or masses, no abdominal bruits or hernia , no rebound or guarding.   Extremities: No lower extremity edema. No clubbing or deformities. Neuro: Alert and oriented x 4   Skin: Warm and dry, no jaundice.   Psych: Alert and  cooperative, normal mood and affect.  Labs:  Lab Results  Component Value Date   CREATININE 0.94 11/09/2020   BUN 21 11/09/2020   NA 140 11/09/2020   K 4.1 11/09/2020   CL 104 11/09/2020   CO2 23 11/09/2020   Lab Results  Component Value Date   ALT 18 11/09/2020   AST 22 11/09/2020   ALKPHOS 92 11/09/2020   BILITOT 0.3 11/09/2020   Lab Results  Component Value Date   WBC 4.6 11/09/2020   HGB 12.1 11/09/2020   HCT 35.8 11/09/2020   MCV 91 11/09/2020   PLT 210 11/09/2020     Imaging Studies: No results found.   Assessment:  75 year old female with history of GERD, postprandial epigastric pain, constipation presenting for follow-up.  Her abdominal pain has resolved.  Reflux is well controlled.  Patient previously declined endoscopy.  Would like to see her try cutting back to omeprazole once daily if tolerated.  If she has recurrent symptoms she has been instructed to go back to twice daily.  Constipation partially treated.  She is taking MiraLAX only twice per week.  Bowel movements sometimes are hard.  Having a bowel movement only 2 times per week.  Colonoscopy up-to-date.   Plan: Try omeprazole 20 mg once daily for now.  If she has recurrent abdominal pain, breakthrough heartburn, she can take additional dose of omeprazole in the evening.  Goal of trying to get back to once daily dosing. Take MiraLAX 17 g every morning if she has not had a bowel movement the day prior. Encouraged her to weigh herself at home once per week, first thing in the morning.  Call if weight drops more than 5 pounds. Return to the office in 6 months or call sooner if needed.

## 2020-12-24 NOTE — Patient Instructions (Signed)
Try cutting back on omeprazole to once daily before breakfast.  If you note that you have heartburn or burning in the stomach/chest later in the day, you can take a second dose.  We want to see whether or not you can get by with just 1 dose daily. Take 1 capful of MiraLAX every morning if you have not had a bowel movement the day prior. Returns to the office in 6 months or call sooner if needed.

## 2020-12-29 ENCOUNTER — Other Ambulatory Visit: Payer: Self-pay | Admitting: Family Medicine

## 2021-01-03 ENCOUNTER — Ambulatory Visit (HOSPITAL_COMMUNITY)
Admission: RE | Admit: 2021-01-03 | Discharge: 2021-01-03 | Disposition: A | Discharge status: Home / Self Care | Payer: Medicare HMO | Source: Ambulatory Visit | Attending: Family Medicine | Admitting: Family Medicine

## 2021-01-03 ENCOUNTER — Other Ambulatory Visit: Payer: Self-pay

## 2021-01-03 DIAGNOSIS — Z1231 Encounter for screening mammogram for malignant neoplasm of breast: Secondary | ICD-10-CM | POA: Insufficient documentation

## 2021-01-14 ENCOUNTER — Telehealth: Payer: Self-pay | Admitting: *Deleted

## 2021-01-14 NOTE — Chronic Care Management (AMB) (Signed)
  Chronic Care Management   Outreach Note  01/14/2021 Name: ESTI DEMELLO MRN: [id number] DOB: [birth year]  KEA CALLAN is a 75 y.o. year old female who is a primary care patient of Lodema Hong Milus Mallick, MD. I reached out to Yehuda Savannah by phone today in response to a referral sent by Ms. Hale Drone Tesoro's PCP, Dr. Lodema Hong.       An unsuccessful telephone outreach was attempted today. The patient was referred to the case management team for assistance with care management and care coordination.   Follow Up Plan: The care management team will reach out to the patient again over the next 7 days. If patient returns call to provider office, please advise to call Embedded Care Management Care Guide Misty Stanley at [phone].  Gwenevere Ghazi  Care Guide, Embedded Care Coordination [facility]  Direct Dial: [phone]

## 2021-01-18 NOTE — Chronic Care Management (AMB) (Signed)
  Chronic Care Management   Note  01/18/2021 Name: Colleen [name] MRN: [id number] DOB: Sep 26, 1945  Colleen [name] is a 75 y.o. year old female who is a primary care patient of Moshe Cipro Norwood Levo, MD. I reached out to Ginnie Smart by phone today in response to a referral sent by Colleen [name]'s PCP, Dr. Moshe Cipro.       Colleen [name] was given information about Chronic Care Management services today including:  CCM service includes personalized support from designated clinical staff supervised by her physician, including individualized plan of care and coordination with other care providers 24/7 contact phone numbers for assistance for urgent and routine care needs. Service will only be billed when office clinical staff spend 20 minutes or more in a month to coordinate care. Only one practitioner may furnish and bill the service in a calendar month. The patient may stop CCM services at any time (effective at the end of the month) by phone call to the office staff. The patient will be responsible for cost sharing (co-pay) of up to 20% of the service fee (after annual deductible is met).  Patient agreed to services and verbal consent obtained.   Follow up plan: Telephone appointment with care management team member scheduled for:01/27/21  [place] [facility]  Direct Dial: [phone]

## 2021-01-27 ENCOUNTER — Ambulatory Visit (INDEPENDENT_AMBULATORY_CARE_PROVIDER_SITE_OTHER): Payer: Medicare HMO | Admitting: *Deleted

## 2021-01-27 DIAGNOSIS — E785 Hyperlipidemia, unspecified: Secondary | ICD-10-CM

## 2021-01-27 DIAGNOSIS — I10 Essential (primary) hypertension: Secondary | ICD-10-CM

## 2021-01-27 NOTE — Chronic Care Management (AMB) (Signed)
Chronic Care Management   CCM RN Visit Note  01/27/2021 Name: Colleen [name] MRN: [id number] DOB: 1945-11-17  Subjective: Colleen [name] is a 75 y.o. year old female who is a primary care patient of Fayrene Helper, MD. The care management team was consulted for assistance with disease management and care coordination needs.    Engaged with patient by telephone for initial visit in response to provider referral for case management and/or care coordination services.   Consent to Services:  The patient was given the following information about Chronic Care Management services today, agreed to services, and gave verbal consent: 1. CCM service includes personalized support from designated clinical staff supervised by the primary care provider, including individualized plan of care and coordination with other care providers 2. 24/7 contact phone numbers for assistance for urgent and routine care needs. 3. Service will only be billed when office clinical staff spend 20 minutes or more in a month to coordinate care. 4. Only one practitioner may furnish and bill the service in a calendar month. 5.The patient may stop CCM services at any time (effective at the end of the month) by phone call to the office staff. 6. The patient will be responsible for cost sharing (co-pay) of up to 20% of the service fee (after annual deductible is met). Patient agreed to services and consent obtained.  Patient agreed to services and verbal consent obtained.   Assessment: Review of patient past medical history, allergies, medications, health status, including review of consultants reports, laboratory and other test data, was performed as part of comprehensive evaluation and provision of chronic care management services.   SDOH (Social Determinants of Health) assessments and interventions performed:  SDOH Interventions    Flowsheet Row Most Recent Value  SDOH Interventions   Food Insecurity Interventions Intervention  Not Indicated  Transportation Interventions Intervention Not Indicated        CCM Care Plan  Allergies  Allergen Reactions   Aspirin Nausea Only    Upset stomach (higher doses)   Fenofibrate Other (See Comments)    Muscle aches   Statins Other (See Comments)    Muscle cramps   Flonase [Fluticasone] Other (See Comments)    Caused increased pressure in the eye    Outpatient Encounter Medications as of 01/27/2021  Medication Sig   acetaminophen (TYLENOL) 650 MG CR tablet Take 650 mg by mouth every 8 (eight) hours as needed for pain.    Artificial Tear Solution (SOOTHE XP) SOLN Place 1 application into both eyes 3 (three) times daily.    Ascorbic Acid (VITAMIN C) 1000 MG tablet Take 1,000 mg by mouth 2 (two) times a week.    Calcium Carb-Cholecalciferol 600-800 MG-UNIT TABS Take 1 tablet by mouth daily.    Cholecalciferol (VITAMIN D3) 125 MCG (5000 UT) CAPS Take 5,000 Units by mouth daily.    Coenzyme Q10 (COQ10) 200 MG CAPS Take 200 mg by mouth daily.   diphenhydrAMINE (BENADRYL) 25 MG tablet Take 25 mg by mouth daily as needed for allergies.    latanoprost (XALATAN) 0.005 % ophthalmic solution Place 1 drop into both eyes at bedtime.   lisinopril-hydrochlorothiazide (ZESTORETIC) 20-25 MG tablet TAKE 1 TABLET EVERY DAY   Multiple Vitamin (MULTIVITAMIN WITH MINERALS) TABS tablet Take 1 tablet by mouth daily.   omeprazole (PRILOSEC) 20 MG capsule Take 31m 30 minutes before a meal once or twice daily as needed.   polyethylene glycol powder (MIRALAX) 17 GM/SCOOP powder Take one capful twice daily until soft  bowel movement, then continue once daily as needed to maintain regular bowel movement. (Patient taking differently: Twice a week)   rosuvastatin (CRESTOR) 5 MG tablet Take 1 tablet (5 mg total) by mouth See admin instructions. Take 1 tablet (5 mg) by mouth on Mondays, Wednesdays, Fridays, & Sundays in the morning.   No facility-administered encounter medications on file as of  01/27/2021.    Patient Active Problem List   Diagnosis Date Noted   Constipation 08/23/2020   Overweight (BMI 25.0-29.9) 11/20/2018   Abnormal finding on EKG 03/16/2018   Neck pain on right side 03/11/2018   Hip pain, right 03/11/2018   Back pain with radiation 03/11/2018   Primary osteoarthritis of left knee    IFG (impaired fasting glucose) 10/13/2013   ALLERGIC RHINITIS, SEASONAL 03/30/2008   Hyperlipidemia LDL goal <100 09/17/2007   Essential hypertension 09/17/2007   GERD 09/17/2007   Osteopenia 09/17/2007    Conditions to be addressed/monitored:HTN and HLD  Care Plan : Hyperlipidemia  Updates made by Kassie Mends, RN since 01/27/2021 12:00 AM     Problem: Health Promotion or Disease Self-Management (Hyperlipidemia)   Priority: Medium     Long-Range Goal: Self-Management Plan Developed for hyperlipidemia   Start Date: 01/27/2021  Expected End Date: 07/27/2021  This Visit's Progress: On track  Priority: Medium  Note:   Current Barriers:  Poorly controlled hyperlipidemia, complicated by diet, lack of exercise Current antihyperlipidemic regimen: rosuvastatin Most recent lipid panel:     Component Value Date/Time   CHOL 158 11/09/2020 0826   TRIG 50 11/09/2020 0826   HDL 57 11/09/2020 0826   CHOLHDL 3.7 03/10/2020 0943   VLDL 14 07/10/2016 0910   LDLCALC 90 11/09/2020 0826   LDLCALC 122 (H) 03/10/2020 0943  Patient reports she lives with her spouse, has adult children she can call on if needed, is independent in all aspects of her care, continues to drive, does light exercises sometimes at home, usually eats a regular diet, trying to drink more water.  Reports does not have advance directives and requests documents be mailed.   ASCVD risk enhancing conditions: age >29, HTN Does not adhere to provider recommendations re:  RN Care Manager Clinical Goal(s):  patient will work with RN Care Manager, providers, and care team towards execution of optimized self-health  management plan patient will attend all scheduled medical appointments: 11/15 primary care provider,  12/6 annual wellness visit the patient will demonstrate ongoing self health care management ability the patient will work with the care management team towards completion of advanced directives Interventions: Collaboration with Fayrene Helper, MD regarding development and update of comprehensive plan of care as evidenced by provider attestation and co-signature Inter-disciplinary care team collaboration (see longitudinal plan of care) Medication review performed; medication list updated in electronic medical record.  Inter-disciplinary care team collaboration (see longitudinal plan of care Evaluation of current treatment plan related to hyperlipidemia and patient's adherence to plan as established by provider. Provided education to patient and/or caregiver about advanced directives Mailed advanced directives packet Provided verbal education and education in My Chart to patient re: heart healthy diet Reviewed avoiding trans/ saturated fats in diet Encouraged daily exercise Discussed plans with patient for ongoing care management follow up and provided patient with direct contact information for care management team Patient Goals/Self-Care Activities: - complete HCPOA and living will- documents mailed to you - call RN care manager if any quesitons at [phone] - spend time outdoors at least 3 times a week  -  try to exercise daily - avoid trans/ saturated fats in your diet - bake or broil, use a crock pot- instead of frying your foods - look over education in My Chart-  heart healthy diet - change to whole grain breads, cereal, pasta - eat 3 to 5 servings of fruits and vegetables each day - fill half the plate with nonstarchy vegetables - limit fast food meals to no more than 1 per week - manage portion size - prepare main meal at home 3 to 5 days each week Follow Up Plan:  Telephone follow up appointment with care management team member scheduled for:   03/29/2021      Care Plan : Hypertension (Adult)  Updates made by Kassie Mends, RN since 01/27/2021 12:00 AM     Problem: Hypertension (Hypertension)   Priority: Medium     Long-Range Goal: Hypertension Monitored   Start Date: 01/27/2021  Expected End Date: 07/27/2021  This Visit's Progress: On track  Priority: Medium  Note:   Objective:  Last practice recorded BP readings:  BP Readings from Last 3 Encounters:  12/24/20 132/77  11/16/20 129/72  08/23/20 (!) 149/74   Most recent eGFR/CrCl:  Lab Results  Component Value Date   EGFR 64 11/09/2020    No components found for: CRCL Current Barriers:  Knowledge Deficits related to basic understanding of hypertension pathophysiology and self care management- needs reinforcement of low sodium diet, importance of exercising and monitoring blood pressure Does not adhere to provider recommendations re: does not check blood pressure, does not have a cuff, reports she will get a cuff and may be able to obtain one through the [place]):  patient will verbalize understanding of plan for hypertension management patient will attend all scheduled medical appointments patient will demonstrate improved adherence to prescribed treatment plan for hypertension as evidenced by taking all medications as prescribed, monitoring and recording blood pressure as directed, adhering to low sodium/DASH diet Interventions:  Collaboration with Fayrene Helper, MD regarding development and update of comprehensive plan of care as evidenced by provider attestation and co-signature Inter-disciplinary care team collaboration (see longitudinal plan of care) Evaluation of current treatment plan related to hypertension self management and patient's adherence to plan as established by provider. Provided education to patient re: stroke prevention, s/s  of heart attack and stroke, DASH diet, complications of uncontrolled blood pressure Reviewed medications with patient and discussed importance of compliance Discussed plans with patient for ongoing care management follow up and provided patient with direct contact information for care management team Advised patient, providing education and rationale, to monitor blood pressure 3 x per week and record, calling PCP for findings outside established parameters.  Reviewed scheduled/upcoming provider appointments including: 11/15 primary care provider   12/6/ annual wellness visit Education sent via My Chart- Low Sodium Diet Encouraged patient to obtain blood pressure cuff Self-Care Activities: Attends all scheduled provider appointments Calls provider office for new concerns, questions, or BP outside discussed parameters Checks BP and records as discussed Follows a low sodium diet/DASH diet Patient Goals: - check blood pressure 3 times per week - choose a place to take my blood pressure (home, clinic or office, retail store) - write blood pressure results in a log or diary  - take all medications as prescribed - follow low sodium diet - look over education in My Chart- Low Sodium diet Follow Up Plan: Telephone follow up appointment with care management team member scheduled for:   03/29/2021  Plan:Telephone follow up appointment with care management team member scheduled for:  03/29/2021  Jacqlyn Larsen [facility], BSN RN Case Manager [facility] [phone]

## 2021-01-27 NOTE — Patient Instructions (Signed)
Visit Information   PATIENT GOALS:     Consent to CCM Services: Ms. Voges was given information about Chronic Care Management services including:  CCM service includes personalized support from designated clinical staff supervised by her physician, including individualized plan of care and coordination with other care providers 24/7 contact phone numbers for assistance for urgent and routine care needs. Service will only be billed when office clinical staff spend 20 minutes or more in a month to coordinate care. Only one practitioner may furnish and bill the service in a calendar month. The patient may stop CCM services at any time (effective at the end of the month) by phone call to the office staff. The patient will be responsible for cost sharing (co-pay) of up to 20% of the service fee (after annual deductible is met).  Patient agreed to services and verbal consent obtained.   Patient verbalizes understanding of instructions provided today and agrees to view in [place].   Telephone follow up appointment with care management team member scheduled for:  11/22/2022Low-Sodium Eating Plan Sodium, which is an element that makes up salt, helps you maintain a healthy balance of fluids in your body. Too much sodium can increase your blood pressure and cause fluid and waste to be held in your body. Your health care provider or dietitian may recommend following this plan if you have high blood pressure (hypertension), kidney disease, liver disease, or heart failure. Eating less sodium can help lower your blood pressure, reduce swelling, and protect your heart, liver, and kidneys. What are tips for following this plan? Reading food labels The Nutrition Facts label lists the amount of sodium in one serving of the food. If you eat more than one serving, you must multiply the listed amount of sodium by the number of servings. Choose foods with less than 140 mg of sodium per serving. Avoid foods with  300 mg of sodium or more per serving. Shopping  Look for lower-sodium products, often labeled as "low-sodium" or "no salt added." Always check the sodium content, even if foods are labeled as "unsalted" or "no salt added." Buy fresh foods. Avoid canned foods and pre-made or frozen meals. Avoid canned, cured, or processed meats. Buy breads that have less than 80 mg of sodium per slice. Cooking  Eat more home-cooked food and less restaurant, buffet, and fast food. Avoid adding salt when cooking. Use salt-free seasonings or herbs instead of table salt or sea salt. Check with your health care provider or pharmacist before using salt substitutes. Cook with plant-based oils, such as canola, sunflower, or olive oil. Meal planning When eating at a restaurant, ask that your food be prepared with less salt or no salt, if possible. Avoid dishes labeled as brined, pickled, cured, smoked, or made with soy sauce, miso, or teriyaki sauce. Avoid foods that contain MSG (monosodium glutamate). MSG is sometimes added to [place] food, bouillon, and some canned foods. Make meals that can be grilled, baked, poached, roasted, or steamed. These are generally made with less sodium. General information Most people on this plan should limit their sodium intake to 1,500-2,000 mg (milligrams) of sodium each day. What foods should I eat? Fruits Fresh, frozen, or canned fruit. Fruit juice. Vegetables Fresh or frozen vegetables. "No salt added" canned vegetables. "No salt added" tomato sauce and paste. Low-sodium or reduced-sodium tomato and vegetable juice. Grains Low-sodium cereals, including oats, puffed wheat and rice, and shredded wheat. Low-sodium crackers. Unsalted rice. Unsalted pasta. Low-sodium bread. Whole-grain breads and whole-grain pasta.  Meats and other proteins Fresh or frozen (no salt added) meat, poultry, seafood, and fish. Low-sodium canned tuna and salmon. Unsalted nuts. Dried peas, beans, and  lentils without added salt. Unsalted canned beans. Eggs. Unsalted nut butters. Dairy Milk. Soy milk. Cheese that is naturally low in sodium, such as ricotta cheese, fresh mozzarella, or Swiss cheese. Low-sodium or reduced-sodium cheese. Cream cheese. Yogurt. Seasonings and condiments Fresh and dried herbs and spices. Salt-free seasonings. Low-sodium mustard and ketchup. Sodium-free salad dressing. Sodium-free light mayonnaise. Fresh or refrigerated horseradish. Lemon juice. Vinegar. Other foods Homemade, reduced-sodium, or low-sodium soups. Unsalted popcorn and pretzels. Low-salt or salt-free chips. The items listed above may not be a complete list of foods and beverages you can eat. Contact a dietitian for more information. What foods should I avoid? Vegetables Sauerkraut, pickled vegetables, and relishes. Olives. [place] fries. Onion rings. Regular canned vegetables (not low-sodium or reduced-sodium). Regular canned tomato sauce and paste (not low-sodium or reduced-sodium). Regular tomato and vegetable juice (not low-sodium or reduced-sodium). Frozen vegetables in sauces. Grains Instant hot cereals. Bread stuffing, pancake, and biscuit mixes. Croutons. Seasoned rice or pasta mixes. Noodle soup cups. Boxed or frozen macaroni and cheese. Regular salted crackers. Self-rising flour. Meats and other proteins Meat or fish that is salted, canned, smoked, spiced, or pickled. Precooked or cured meat, such as sausages or meat loaves. [place] Salines. Ham. Pepperoni. Hot dogs. Corned beef. Chipped beef. Salt pork. Jerky. Pickled herring. Anchovies and sardines. Regular canned tuna. Salted nuts. Dairy Processed cheese and cheese spreads. Hard cheeses. Cheese curds. Blue cheese. Feta cheese. String cheese. Regular cottage cheese. Buttermilk. Canned milk. Fats and oils Salted butter. Regular margarine. Ghee. Bacon fat. Seasonings and condiments Onion salt, garlic salt, seasoned salt, table salt, and sea salt. Canned  and packaged gravies. Worcestershire sauce. Tartar sauce. Barbecue sauce. Teriyaki sauce. Soy sauce, including reduced-sodium. Steak sauce. Fish sauce. Oyster sauce. Cocktail sauce. Horseradish that you find on the shelf. Regular ketchup and mustard. Meat flavorings and tenderizers. Bouillon cubes. Hot sauce. Pre-made or packaged marinades. Pre-made or packaged taco seasonings. Relishes. Regular salad dressings. Salsa. Other foods Salted popcorn and pretzels. Corn chips and puffs. Potato and tortilla chips. Canned or dried soups. Pizza. Frozen entrees and pot pies. The items listed above may not be a complete list of foods and beverages you should avoid. Contact a dietitian for more information. Summary Eating less sodium can help lower your blood pressure, reduce swelling, and protect your heart, liver, and kidneys. Most people on this plan should limit their sodium intake to 1,500-2,000 mg (milligrams) of sodium each day. Canned, boxed, and frozen foods are high in sodium. Restaurant foods, fast foods, and pizza are also very high in sodium. You also get sodium by adding salt to food. Try to cook at home, eat more fresh fruits and vegetables, and eat less fast food and canned, processed, or prepared foods. This information is not intended to replace advice given to you by your health care provider. Make sure you discuss any questions you have with your health care provider. Document Revised: 05/30/2019 Document Reviewed: 03/26/2019 Elsevier Patient Education  2022 [place] Heart-healthy meal planning includes: Eating less unhealthy fats. Eating more healthy fats. Making other changes in your diet. Talk with your doctor or a diet specialist (dietitian) to create an eating plan that is right for you. What is my plan? Your doctor may recommend an eating plan that includes: Total fat: ______% or less of total calories a day. Saturated  fat: ______% or less of total  calories a day. Cholesterol: less than _________mg a day. What are tips for following this plan? Cooking Avoid frying your food. Try to bake, boil, grill, or broil it instead. You can also reduce fat by: Removing the skin from poultry. Removing all visible fats from meats. Steaming vegetables in water or broth. Meal planning  At meals, divide your plate into four equal parts: Fill one-half of your plate with vegetables and green salads. Fill one-fourth of your plate with whole grains. Fill one-fourth of your plate with lean protein foods. Eat 4-5 servings of vegetables per day. A serving of vegetables is: 1 cup of raw or cooked vegetables. 2 cups of raw leafy greens. Eat 4-5 servings of fruit per day. A serving of fruit is: 1 medium whole fruit.  cup of dried fruit.  cup of fresh, frozen, or canned fruit.  cup of 100% fruit juice. Eat more foods that have soluble fiber. These are apples, broccoli, carrots, beans, peas, and barley. Try to get 20-30 g of fiber per day. Eat 4-5 servings of nuts, legumes, and seeds per week: 1 serving of dried beans or legumes equals  cup after being cooked. 1 serving of nuts is  cup. 1 serving of seeds equals 1 tablespoon. General information Eat more home-cooked food. Eat less restaurant, buffet, and fast food. Limit or avoid alcohol. Limit foods that are high in starch and sugar. Avoid fried foods. Lose weight if you are overweight. Keep track of how much salt (sodium) you eat. This is important if you have high blood pressure. Ask your doctor to tell you more about this. Try to add vegetarian meals each week. Fats Choose healthy fats. These include olive oil and canola oil, flaxseeds, walnuts, almonds, and seeds. Eat more omega-3 fats. These include salmon, mackerel, sardines, tuna, flaxseed oil, and ground flaxseeds. Try to eat fish at least 2 times each week. Check food labels. Avoid foods with trans fats or high amounts of saturated  fat. Limit saturated fats. These are often found in animal products, such as meats, butter, and cream. These are also found in plant foods, such as palm oil, palm kernel oil, and coconut oil. Avoid foods with partially hydrogenated oils in them. These have trans fats. Examples are stick margarine, some tub margarines, cookies, crackers, and other baked goods. What foods can I eat? Fruits All fresh, canned (in natural juice), or frozen fruits. Vegetables Fresh or frozen vegetables (raw, steamed, roasted, or grilled). Green salads. Grains Most grains. Choose whole wheat and whole grains most of the time. Rice and pasta, including brown rice and pastas made with whole wheat. Meats and other proteins Lean, well-trimmed beef, veal, pork, and lamb. Chicken and [place] without skin. All fish and shellfish. Wild duck, rabbit, pheasant, and venison. Egg whites or low-cholesterol egg substitutes. Dried beans, peas, lentils, and tofu. Seeds and most nuts. Dairy Low-fat or nonfat cheeses, including ricotta and mozzarella. Skim or 1% milk that is liquid, powdered, or evaporated. Buttermilk that is made with low-fat milk. Nonfat or low-fat yogurt. Fats and oils Non-hydrogenated (trans-free) margarines. Vegetable oils, including soybean, sesame, sunflower, olive, peanut, safflower, corn, canola, and cottonseed. Salad dressings or mayonnaise made with a vegetable oil. Beverages Mineral water. Coffee and tea. Diet carbonated beverages. Sweets and desserts Sherbet, gelatin, and fruit ice. Small amounts of dark chocolate. Limit all sweets and desserts. Seasonings and condiments All seasonings and condiments. The items listed above may not be a complete list  of foods and drinks you can eat. Contact a dietitian for more options. What foods should I avoid? Fruits Canned fruit in heavy syrup. Fruit in cream or butter sauce. Fried fruit. Limit coconut. Vegetables Vegetables cooked in cheese, cream, or butter  sauce. Fried vegetables. Grains Breads that are made with saturated or trans fats, oils, or whole milk. Croissants. Sweet rolls. Donuts. High-fat crackers, such as cheese crackers. Meats and other proteins Fatty meats, such as hot dogs, ribs, sausage, bacon, rib-eye roast or steak. High-fat deli meats, such as salami and bologna. Caviar. Domestic duck and goose. Organ meats, such as liver. Dairy Cream, sour cream, cream cheese, and creamed cottage cheese. Whole-milk cheeses. Whole or 2% milk that is liquid, evaporated, or condensed. Whole buttermilk. Cream sauce or high-fat cheese sauce. Yogurt that is made from whole milk. Fats and oils Meat fat, or shortening. Cocoa butter, hydrogenated oils, palm oil, coconut oil, palm kernel oil. Solid fats and shortenings, including bacon fat, salt pork, lard, and butter. Nondairy cream substitutes. Salad dressings with cheese or sour cream. Beverages Regular sodas and juice drinks with added sugar. Sweets and desserts Frosting. Pudding. Cookies. Cakes. Pies. Milk chocolate or white chocolate. Buttered syrups. Full-fat ice cream or ice cream drinks. The items listed above may not be a complete list of foods and drinks to avoid. Contact a dietitian for more information. Summary Heart-healthy meal planning includes eating less unhealthy fats, eating more healthy fats, and making other changes in your diet. Eat a balanced diet. This includes fruits and vegetables, low-fat or nonfat dairy, lean protein, nuts and legumes, whole grains, and heart-healthy oils and fats. This information is not intended to replace advice given to you by your health care provider. Make sure you discuss any questions you have with your health care provider. Document Revised: 09/02/2020 Document Reviewed: 09/02/2020 Elsevier Patient Education  2022 [place] Directive Advance directives are legal documents that allow you to make decisions about your health care and  medical treatment in case you become unable to communicate for yourself. Advance directives let your wishes be known to family, friends, and health care providers. Discussing and writing advance directives should happen over time rather than all at once. Advance directives can be changed and updated at any time. There are different types of advance directives, such as: Medical power of attorney. Living will. Do not resuscitate (DNR) order or do not attempt resuscitation (DNAR) order. Health care proxy and medical power of attorney A health care proxy is also called a health care agent. This person is appointed to make medical decisions for you when you are unable to make decisions for yourself. Generally, people ask a trusted friend or family member to act as their proxy and represent their preferences. Make sure you have an agreement with your trusted person to act as your proxy. A proxy may have to make a medical decision on your behalf if your wishes are not known. A medical power of attorney, also called a durable power of attorney for health care, is a legal document that names your health care proxy. Depending on the laws in your state, the document may need to be: Signed. Notarized. Dated. Copied. Witnessed. Incorporated into your medical record. You may also want to appoint a trusted person to manage your money in the event you are unable to do so. This is called a durable power of attorney for finances. It is a separate legal document from the durable power of attorney for health  care. You may choose your health care proxy or someone different to act as your agent in money matters. If you do not appoint a proxy, or there is a concern that the proxy is not acting in your best interest, a court may appoint a guardian to act on your behalf. Living will A living will is a set of instructions that state your wishes about medical care when you cannot express them yourself. Health care providers  should keep a copy of your living will in your medical record. You may want to give a copy to family members or friends. To alert caregivers in case of an emergency, you can place a card in your wallet to let them know that you have a living will and where they can find it. A living will is used if you become: Terminally ill. Disabled. Unable to communicate or make decisions. The following decisions should be included in your living will: To use or not to use life support equipment, such as dialysis machines and breathing machines (ventilators). Whether you want a DNR or DNAR order. This tells health care providers not to use cardiopulmonary resuscitation (CPR) if breathing or heartbeat stops. To use or not to use tube feeding. To be given or not to be given food and fluids. Whether you want comfort (palliative) care when the goal becomes comfort rather than a cure. Whether you want to donate your organs and tissues. A living will does not give instructions for distributing your money and property if you should pass away. DNR or DNAR A DNR or DNAR order is a request not to have CPR in the event that your heart stops beating or you stop breathing. If a DNR or DNAR order has not been made and shared, a health care provider will try to help any patient whose heart has stopped or who has stopped breathing. If you plan to have surgery, talk with your health care provider about how your DNR or DNAR order will be followed if problems occur. What if I do not have an advance directive? Some states assign family decision makers to act on your behalf if you do not have an advance directive. Each state has its own laws about advance directives. You may want to check with your health care provider, attorney, or state representative about the laws in your state. Summary Advance directives are legal documents that allow you to make decisions about your health care and medical treatment in case you become unable to  communicate for yourself. The process of discussing and writing advance directives should happen over time. You can change and update advance directives at any time. Advance directives may include a medical power of attorney, a living will, and a DNR or DNAR order. This information is not intended to replace advice given to you by your health care provider. Make sure you discuss any questions you have with your health care provider. Document Revised: 01/27/2020 Document Reviewed: 01/27/2020 Elsevier Patient Education  2022 [facility], BSN RN Case Manager [facility] [phone]   CLINICAL CARE PLAN: Patient Care Plan: Hyperlipidemia     Problem Identified: Health Promotion or Disease Self-Management (Hyperlipidemia)   Priority: Medium     Long-Range Goal: Self-Management Plan Developed for hyperlipidemia   Start Date: 01/27/2021  Expected End Date: 07/27/2021  This Visit's Progress: On track  Priority: Medium  Note:   Current Barriers:  Poorly controlled hyperlipidemia, complicated by diet, lack of exercise Current  antihyperlipidemic regimen: rosuvastatin Most recent lipid panel:     Component Value Date/Time   CHOL 158 11/09/2020 0826   TRIG 50 11/09/2020 0826   HDL 57 11/09/2020 0826   CHOLHDL 3.7 03/10/2020 0943   VLDL 14 07/10/2016 0910   LDLCALC 90 11/09/2020 0826   LDLCALC 122 (H) 03/10/2020 0943  Patient reports she lives with her spouse, has adult children she can call on if needed, is independent in all aspects of her care, continues to drive, does light exercises sometimes at home, usually eats a regular diet, trying to drink more water.  Reports does not have advance directives and requests documents be mailed.   ASCVD risk enhancing conditions: age >56, HTN Does not adhere to provider recommendations re:  RN Care Manager Clinical Goal(s):  patient will work with RN Care Manager, providers, and care team towards execution of  optimized self-health management plan patient will attend all scheduled medical appointments: 11/15 primary care provider,  12/6 annual wellness visit the patient will demonstrate ongoing self health care management ability the patient will work with the care management team towards completion of advanced directives Interventions: Collaboration with Fayrene Helper, MD regarding development and update of comprehensive plan of care as evidenced by provider attestation and co-signature Inter-disciplinary care team collaboration (see longitudinal plan of care) Medication review performed; medication list updated in electronic medical record.  Inter-disciplinary care team collaboration (see longitudinal plan of care Evaluation of current treatment plan related to hyperlipidemia and patient's adherence to plan as established by provider. Provided education to patient and/or caregiver about advanced directives Mailed advanced directives packet Provided verbal education and education in My Chart to patient re: heart healthy diet Reviewed avoiding trans/ saturated fats in diet Encouraged daily exercise Discussed plans with patient for ongoing care management follow up and provided patient with direct contact information for care management team Patient Goals/Self-Care Activities: - complete HCPOA and living will- documents mailed to you - call RN care manager if any quesitons at [phone] - spend time outdoors at least 3 times a week  - try to exercise daily - avoid trans/ saturated fats in your diet - bake or broil, use a crock pot- instead of frying your foods - look over education in My Chart-  heart healthy diet - change to whole grain breads, cereal, pasta - eat 3 to 5 servings of fruits and vegetables each day - fill half the plate with nonstarchy vegetables - limit fast food meals to no more than 1 per week - manage portion size - prepare main meal at home 3 to 5 days each  week Follow Up Plan: Telephone follow up appointment with care management team member scheduled for:   03/29/2021      Patient Care Plan: Hypertension (Adult)     Problem Identified: Hypertension (Hypertension)   Priority: Medium     Long-Range Goal: Hypertension Monitored   Start Date: 01/27/2021  Expected End Date: 07/27/2021  This Visit's Progress: On track  Priority: Medium  Note:   Objective:  Last practice recorded BP readings:  BP Readings from Last 3 Encounters:  12/24/20 132/77  11/16/20 129/72  08/23/20 (!) 149/74   Most recent eGFR/CrCl:  Lab Results  Component Value Date   EGFR 64 11/09/2020    No components found for: CRCL Current Barriers:  Knowledge Deficits related to basic understanding of hypertension pathophysiology and self care management- needs reinforcement of low sodium diet, importance of exercising and monitoring blood pressure Does not  adhere to provider recommendations re: does not check blood pressure, does not have a cuff, reports she will get a cuff and may be able to obtain one through the [place]):  patient will verbalize understanding of plan for hypertension management patient will attend all scheduled medical appointments patient will demonstrate improved adherence to prescribed treatment plan for hypertension as evidenced by taking all medications as prescribed, monitoring and recording blood pressure as directed, adhering to low sodium/DASH diet Interventions:  Collaboration with Fayrene Helper, MD regarding development and update of comprehensive plan of care as evidenced by provider attestation and co-signature Inter-disciplinary care team collaboration (see longitudinal plan of care) Evaluation of current treatment plan related to hypertension self management and patient's adherence to plan as established by provider. Provided education to patient re: stroke prevention, s/s of heart attack and  stroke, DASH diet, complications of uncontrolled blood pressure Reviewed medications with patient and discussed importance of compliance Discussed plans with patient for ongoing care management follow up and provided patient with direct contact information for care management team Advised patient, providing education and rationale, to monitor blood pressure 3 x per week and record, calling PCP for findings outside established parameters.  Reviewed scheduled/upcoming provider appointments including: 11/15 primary care provider   12/6/ annual wellness visit Education sent via My Chart- Low Sodium Diet Encouraged patient to obtain blood pressure cuff Self-Care Activities: Attends all scheduled provider appointments Calls provider office for new concerns, questions, or BP outside discussed parameters Checks BP and records as discussed Follows a low sodium diet/DASH diet Patient Goals: - check blood pressure 3 times per week - choose a place to take my blood pressure (home, clinic or office, retail store) - write blood pressure results in a log or diary  - take all medications as prescribed - follow low sodium diet - look over education in My Chart- Low Sodium diet Follow Up Plan: Telephone follow up appointment with care management team member scheduled for:   03/29/2021

## 2021-02-04 DIAGNOSIS — E785 Hyperlipidemia, unspecified: Secondary | ICD-10-CM | POA: Diagnosis not present

## 2021-02-04 DIAGNOSIS — I10 Essential (primary) hypertension: Secondary | ICD-10-CM

## 2021-02-11 ENCOUNTER — Ambulatory Visit (INDEPENDENT_AMBULATORY_CARE_PROVIDER_SITE_OTHER): Payer: Medicare HMO

## 2021-02-11 ENCOUNTER — Other Ambulatory Visit: Payer: Self-pay

## 2021-02-11 DIAGNOSIS — Z23 Encounter for immunization: Secondary | ICD-10-CM

## 2021-03-22 ENCOUNTER — Other Ambulatory Visit: Payer: Self-pay

## 2021-03-22 ENCOUNTER — Ambulatory Visit (INDEPENDENT_AMBULATORY_CARE_PROVIDER_SITE_OTHER): Payer: Medicare HMO | Admitting: Family Medicine

## 2021-03-22 ENCOUNTER — Encounter: Payer: Self-pay | Admitting: Family Medicine

## 2021-03-22 VITALS — BP 144/85 | HR 78 | Temp 98.5°F | Resp 17 | Ht 61.5 in | Wt 139.1 lb

## 2021-03-22 DIAGNOSIS — J101 Influenza due to other identified influenza virus with other respiratory manifestations: Secondary | ICD-10-CM | POA: Diagnosis not present

## 2021-03-22 DIAGNOSIS — I10 Essential (primary) hypertension: Secondary | ICD-10-CM

## 2021-03-22 DIAGNOSIS — E785 Hyperlipidemia, unspecified: Secondary | ICD-10-CM | POA: Diagnosis not present

## 2021-03-22 LAB — POCT INFLUENZA A/B
Influenza A, POC: POSITIVE — AB
Influenza B, POC: NEGATIVE

## 2021-03-22 MED ORDER — OSELTAMIVIR PHOSPHATE 75 MG PO CAPS: 75.0000 mg | ORAL_CAPSULE | Freq: Two times a day (BID) | ORAL | 0 refills | Status: DC

## 2021-03-22 NOTE — Progress Notes (Signed)
   Colleen [name]     MRN: [id number]      DOB: [birth year]   HPI Colleen [name] is here with a 2 week h/o generalized faigue, worse in the past 1 week, and hse has known influenza exposure 1 week ago C/o body aches and chills C/o left sided headache and left ear pain C/o occasional cough, no sputum, no nasal drainage, does have runny eyes C/o left sided headache  Has had flu vaccine  ROS Denies chest pains, palpitations and leg swelling Denies abdominal pain, nausea, vomiting,diarrhea or constipation.   Denies dysuria, frequency, hesitancy or incontinence. Denies joint pain, swelling and limitation in mobility.Denies headaches, seizures, numbness, or tingling. Denies depression, anxiety or insomnia. Denies skin break down or rash.   PE  BP (!) 144/85   Pulse 78   Temp 98.5 F (36.9 C)   Resp 17   Ht 5' 1.5" (1.562 m)   Wt 139 lb 1.3 oz (63.1 kg)   SpO2 98%   BMI 25.85 kg/m   Patient alert and oriented and in no cardiopulmonary distress.Ill appearing  HEENT: No facial asymmetry, EOMI,     Neck supple .No adenopathy, TM clear  Chest: Clear to auscultation bilaterally.  CVS: S1, S2 no murmurs, no S3.Regular rate.  ABD: Soft non tender.   Ext: No edema  MS: Adequate ROM spine, shoulders, hips and knees.  Skin: Intact, no ulcerations or rash noted.  Psych: Good eye contact, normal affect. Memory intact not anxious or depressed appearing.  CNS: CN 2-12 intact, power,  normal throughout.no focal deficits noted.   Assessment & Plan  Influenza A Tamiflu prescribed, advised fluids, rest , tylenol   Essential hypertension Elevated at visit , will reassess at next visit, has been uising OTC decongestans DASH diet and commitment to daily physical activity for a minimum of 30 minutes discussed and encouraged, as a part of hypertension management. The importance of attaining a healthy weight is also discussed.  BP/Weight 03/22/2021 12/24/2020 11/16/2020 08/23/2020 05/20/2020  05/19/2020 03/16/2020  Systolic BP 144 132 129 149 140 109 150  Diastolic BP 85 77 72 74 71 67 70  Wt. (Lbs) 139.08 138 136 140 139.8 140 141  BMI 25.85 25.24 24.87 25.61 25.57 25.61 25.79       Hyperlipidemia LDL goal <100 Hyperlipidemia:Low fat diet discussed and encouraged.   Lipid Panel  Lab Results  Component Value Date   CHOL 158 11/09/2020   HDL 57 11/09/2020   LDLCALC 90 11/09/2020   TRIG 50 11/09/2020   CHOLHDL 3.7 03/10/2020     Updated lab needed at/ before next visit.

## 2021-03-22 NOTE — Assessment & Plan Note (Signed)
Tamiflu prescribed, advised fluids, rest , tylenol

## 2021-03-22 NOTE — Patient Instructions (Addendum)
Annual exam in office with MD mid to December, or early January, call if you need me sooner  Labs today, lipid, cmp and EGFr, TSH  Thanks for choosing [facility], we consider it a privelige to serve you.   You are treated for influenza A, medication is sent to your pharmacy

## 2021-03-22 NOTE — Assessment & Plan Note (Signed)
Elevated at visit , will reassess at next visit, has been uising OTC decongestans DASH diet and commitment to daily physical activity for a minimum of 30 minutes discussed and encouraged, as a part of hypertension management. The importance of attaining a healthy weight is also discussed.  BP/Weight 03/22/2021 12/24/2020 11/16/2020 08/23/2020 05/20/2020 05/19/2020 03/16/2020  Systolic BP 144 132 129 149 140 109 150  Diastolic BP 85 77 72 74 71 67 70  Wt. (Lbs) 139.08 138 136 140 139.8 140 141  BMI 25.85 25.24 24.87 25.61 25.57 25.61 25.79

## 2021-03-22 NOTE — Assessment & Plan Note (Signed)
Hyperlipidemia:Low fat diet discussed and encouraged.   Lipid Panel  Lab Results  Component Value Date   CHOL 158 11/09/2020   HDL 57 11/09/2020   LDLCALC 90 11/09/2020   TRIG 50 11/09/2020   CHOLHDL 3.7 03/10/2020     Updated lab needed at/ before next visit.

## 2021-03-29 ENCOUNTER — Ambulatory Visit (INDEPENDENT_AMBULATORY_CARE_PROVIDER_SITE_OTHER): Payer: Medicare HMO | Admitting: *Deleted

## 2021-03-29 DIAGNOSIS — I10 Essential (primary) hypertension: Secondary | ICD-10-CM

## 2021-03-29 DIAGNOSIS — M549 Dorsalgia, unspecified: Secondary | ICD-10-CM

## 2021-03-29 DIAGNOSIS — E785 Hyperlipidemia, unspecified: Secondary | ICD-10-CM

## 2021-03-29 NOTE — Chronic Care Management (AMB) (Signed)
Chronic Care Management   CCM RN Visit Note  03/29/2021 Name: Colleen [name] MRN: [id number] DOB: Mar 11, 1946  Subjective: Colleen [name] is a 75 y.o. year old female who is a primary care patient of Fayrene Helper, MD. The care management team was consulted for assistance with disease management and care coordination needs.    Engaged with patient by telephone for follow up visit in response to provider referral for case management and/or care coordination services.   Consent to Services:  The patient was given information about Chronic Care Management services, agreed to services, and gave verbal consent prior to initiation of services.  Please see initial visit note for detailed documentation.   Patient agreed to services and verbal consent obtained.   Assessment: Review of patient past medical history, allergies, medications, health status, including review of consultants reports, laboratory and other test data, was performed as part of comprehensive evaluation and provision of chronic care management services.   SDOH (Social Determinants of Health) assessments and interventions performed:    CCM Care Plan  Allergies  Allergen Reactions   Aspirin Nausea Only    Upset stomach (higher doses)   Fenofibrate Other (See Comments)    Muscle aches   Statins Other (See Comments)    Muscle cramps   Flonase [Fluticasone] Other (See Comments)    Caused increased pressure in the eye    Outpatient Encounter Medications as of 03/29/2021  Medication Sig   acetaminophen (TYLENOL) 650 MG CR tablet Take 650 mg by mouth every 8 (eight) hours as needed for pain.    Artificial Tear Solution (SOOTHE XP) SOLN Place 1 application into both eyes 3 (three) times daily.    Ascorbic Acid (VITAMIN C) 1000 MG tablet Take 1,000 mg by mouth 2 (two) times a week.    Calcium Carb-Cholecalciferol 600-800 MG-UNIT TABS Take 1 tablet by mouth daily.    Cholecalciferol (VITAMIN D3) 125 MCG (5000 UT) CAPS Take  5,000 Units by mouth daily.    Coenzyme Q10 (COQ10) 200 MG CAPS Take 200 mg by mouth daily.   diphenhydrAMINE (BENADRYL) 25 MG tablet Take 25 mg by mouth daily as needed for allergies.    latanoprost (XALATAN) 0.005 % ophthalmic solution Place 1 drop into both eyes at bedtime.   lisinopril-hydrochlorothiazide (ZESTORETIC) 20-25 MG tablet TAKE 1 TABLET EVERY DAY   Multiple Vitamin (MULTIVITAMIN WITH MINERALS) TABS tablet Take 1 tablet by mouth daily.   omeprazole (PRILOSEC) 20 MG capsule Take 20m 30 minutes before a meal once or twice daily as needed.   polyethylene glycol powder (MIRALAX) 17 GM/SCOOP powder Take one capful twice daily until soft bowel movement, then continue once daily as needed to maintain regular bowel movement. (Patient taking differently: Twice a week)   rosuvastatin (CRESTOR) 5 MG tablet Take 1 tablet (5 mg total) by mouth See admin instructions. Take 1 tablet (5 mg) by mouth on Mondays, Wednesdays, Fridays, & Sundays in the morning.   oseltamivir (TAMIFLU) 75 MG capsule Take 1 capsule (75 mg total) by mouth 2 (two) times daily. (Patient not taking: Reported on 03/29/2021)   No facility-administered encounter medications on file as of 03/29/2021.    Patient Active Problem List   Diagnosis Date Noted   Influenza A 03/22/2021   Constipation 08/23/2020   Overweight (BMI 25.0-29.9) 11/20/2018   Abnormal finding on EKG 03/16/2018   Neck pain on right side 03/11/2018   Hip pain, right 03/11/2018   Back pain with radiation 03/11/2018   Primary  osteoarthritis of left knee    IFG (impaired fasting glucose) 10/13/2013   ALLERGIC RHINITIS, SEASONAL 03/30/2008   Hyperlipidemia LDL goal <100 09/17/2007   Essential hypertension 09/17/2007   GERD 09/17/2007   Osteopenia 09/17/2007    Conditions to be addressed/monitored:HTN and HLD  Care Plan : RN Care Manager plan of care  Updates made by Kassie Mends, RN since 03/29/2021 12:00 AM     Problem: No plan of care  established for management of chronic disease states (HTN, HLD)   Priority: High     Long-Range Goal: Development of plan of care for chronic disease management (HTN, HLD)   Start Date: 03/29/2021  Expected End Date: 09/25/2021  Priority: High  Note:   Current Barriers:  Knowledge Deficits related to plan of care for management of HTN and HLD  Patient reports she lives with her spouse, has adult children she can call on if needed, is independent in all aspects of her care, continues to drive, does light exercises sometimes at home, usually eats a regular diet, trying to drink more water.  Reports does not have advance directives and did receive documents in the mail but has not completed. Patient reports she has not obtained blood pressure cuff yet and is not checking blood pressure at present (pt aware of being able to get one through [place])  ASCVD risk enhancing conditions: age >70, HTN Patient reports she has had issues with constipation and is seeing gastroenterologist, taking medications and states "this has helped"  RNCM Clinical Goal(s):  Patient will verbalize understanding of plan for management of HTN and HLD as evidenced by patient report, review EHR and  through collaboration with RN Care manager, provider, and care team.   Interventions: 1:1 collaboration with primary care provider regarding development and update of comprehensive plan of care as evidenced by provider attestation and co-signature Inter-disciplinary care team collaboration (see longitudinal plan of care) Evaluation of current treatment plan related to  self management and patient's adherence to plan as established by provider  Hypertension Interventions: Last practice recorded BP readings:  BP Readings from Last 3 Encounters:  03/22/21 (!) 144/85  12/24/20 132/77  11/16/20 129/72  Most recent eGFR/CrCl:  Lab Results  Component Value Date   EGFR 64 11/09/2020    No components found for:  CRCL  Evaluation of current treatment plan related to hypertension self management and patient's adherence to plan as established by provider; Reviewed medications with patient and discussed importance of compliance; Discussed complications of poorly controlled blood pressure such as heart disease, stroke, circulatory complications, vision complications, kidney impairment, sexual dysfunction;  Reinforced low sodium diet   Hyperlipidemia:  (Status: Goal on Track (progressing): YES.) Long Term Goal  Lab Results  Component Value Date   CHOL 158 11/09/2020   HDL 57 11/09/2020   LDLCALC 90 11/09/2020   TRIG 50 11/09/2020   CHOLHDL 3.7 03/10/2020     Medication review performed; medication list updated in electronic medical record.  Counseled on importance of regular laboratory monitoring as prescribed; Reviewed role and benefits of statin for ASCVD risk reduction; Reviewed importance of limiting foods high in cholesterol; Reviewed exercise goals and target of 150 minutes per week;  Patient Goals/Self-Care Activities: Take medications as prescribed   Attend all scheduled provider appointments Call pharmacy for medication refills 3-7 days in advance of running out of medications Perform IADL's (shopping, preparing meals, housekeeping, managing finances) independently Call provider office for new concerns or questions  check blood pressure  3 times per week choose a place to take my blood pressure (home, clinic or office, retail store) keep a blood pressure log take blood pressure log to all doctor appointments keep all doctor appointments take medications for blood pressure exactly as prescribed eat more whole grains, fruits and vegetables, lean meats and healthy fats - call doctor with any symptoms you believe are related to your medicine - adhere to prescribed diet: heart healthy, avoid trans/ saturated fats  Follow Up Plan:  Telephone follow up appointment with care management team  member scheduled for:  05/31/2021       Plan:Telephone follow up appointment with care management team member scheduled for:  05/31/2021  Jacqlyn Larsen [facility], BSN RN Case Manager [facility] [phone]

## 2021-03-29 NOTE — Patient Instructions (Addendum)
Visit Information  Thank you for taking time to visit with me today. Please don't hesitate to contact me if I can be of assistance to you before our next scheduled telephone appointment.  Following are the goals we discussed today:   Take medications as prescribed   Attend all scheduled provider appointments Call pharmacy for medication refills 3-7 days in advance of running out of medications Perform IADL's (shopping, preparing meals, housekeeping, managing finances) independently Call provider office for new concerns or questions  check blood pressure 3 times per week choose a place to take my blood pressure (home, clinic or office, retail store) keep a blood pressure log take blood pressure log to all doctor appointments keep all doctor appointments take medications for blood pressure exactly as prescribed eat more whole grains, fruits and vegetables, lean meats and healthy fats call doctor with any symptoms you believe are related to your medicine adhere to prescribed diet: heart healthy, avoid trans/ saturated fats  Our next appointment is by telephone on 05/31/2021 at 915 am  Please call the care guide team at [phone] if you need to cancel or reschedule your appointment.   Please call 911 go to [facility] [address] [place] [id number]) call [phone]-TALK (toll free, 24 hour hotline) if you are experiencing a Mental Health or Behavioral Health Crisis or need someone to talk to.  Patient verbalizes understanding of instructions provided today and agrees to view in MyChart.   Irving Shows [facility], BSN RN Case Manager [facility] [phone]

## 2021-04-06 DIAGNOSIS — E785 Hyperlipidemia, unspecified: Secondary | ICD-10-CM | POA: Diagnosis not present

## 2021-04-06 DIAGNOSIS — I10 Essential (primary) hypertension: Secondary | ICD-10-CM

## 2021-04-11 NOTE — Patient Instructions (Addendum)
[name] , Thank you for taking time to come for your Medicare Wellness Visit. I appreciate your ongoing commitment to your health goals. Please review the following plan we discussed and let me know if I can assist you in the future.   These are the goals we discussed:  Goals      Increase physical activity     Increase physical activity to 4-5 days per week as able     Patient Stated     Do brain teasers and word searches to help memory and keep brain healthy        This is a list of the screening recommended for you and due dates:  Health Maintenance  Topic Date Due   Tetanus Vaccine  03/22/2022*   Mammogram  01/04/2023   Colon Cancer Screening  02/26/2030   Pneumonia Vaccine  Completed   Flu Shot  Completed   DEXA scan (bone density measurement)  Completed   COVID-19 Vaccine  Completed   Hepatitis C Screening: USPSTF Recommendation to screen - Ages 34-79 yo.  Completed   Zoster (Shingles) Vaccine  Completed   HPV Vaccine  Aged Out  *Topic was postponed. The date shown is not the original due date.     Health Maintenance, Female Adopting a healthy lifestyle and getting preventive care are important in promoting health and wellness. Ask your health care provider about: The right schedule for you to have regular tests and exams. Things you can do on your own to prevent diseases and keep yourself healthy. What should I know about diet, weight, and exercise? Eat a healthy diet  Eat a diet that includes plenty of vegetables, fruits, low-fat dairy products, and lean protein. Do not eat a lot of foods that are high in solid fats, added sugars, or sodium. Maintain a healthy weight Body mass index (BMI) is used to identify weight problems. It estimates body fat based on height and weight. Your health care provider can help determine your BMI and help you achieve or maintain a healthy weight. Get regular exercise Get regular exercise. This is one of the most important things  you can do for your health. Most adults should: Exercise for at least 150 minutes each week. The exercise should increase your heart rate and make you sweat (moderate-intensity exercise). Do strengthening exercises at least twice a week. This is in addition to the moderate-intensity exercise. Spend less time sitting. Even light physical activity can be beneficial. Watch cholesterol and blood lipids Have your blood tested for lipids and cholesterol at 74 years of age, then have this test every 5 years. Have your cholesterol levels checked more often if: Your lipid or cholesterol levels are high. You are older than 74 years of age. You are at high risk for heart disease. What should I know about cancer screening? Depending on your health history and family history, you may need to have cancer screening at various ages. This may include screening for: Breast cancer. Cervical cancer. Colorectal cancer. Skin cancer. Lung cancer. What should I know about heart disease, diabetes, and high blood pressure? Blood pressure and heart disease High blood pressure causes heart disease and increases the risk of stroke. This is more likely to develop in people who have high blood pressure readings or are overweight. Have your blood pressure checked: Every 3-5 years if you are 63-20 years of age. Every year if you are 54 years old or older. Diabetes Have regular diabetes screenings. This checks your fasting  blood sugar level. Have the screening done: Once every three years after age 25 if you are at a normal weight and have a low risk for diabetes. More often and at a younger age if you are overweight or have a high risk for diabetes. What should I know about preventing infection? Hepatitis B If you have a higher risk for hepatitis B, you should be screened for this virus. Talk with your health care provider to find out if you are at risk for hepatitis B infection. Hepatitis C Testing is recommended  for: Everyone born from [birth year] through 1964. Anyone with known risk factors for hepatitis C. Sexually transmitted infections (STIs) Get screened for STIs, including gonorrhea and chlamydia, if: You are sexually active and are younger than 74 years of age. You are older than 74 years of age and your health care provider tells you that you are at risk for this type of infection. Your sexual activity has changed since you were last screened, and you are at increased risk for chlamydia or gonorrhea. Ask your health care provider if you are at risk. Ask your health care provider about whether you are at high risk for HIV. Your health care provider may recommend a prescription medicine to help prevent HIV infection. If you choose to take medicine to prevent HIV, you should first get tested for HIV. You should then be tested every 3 months for as long as you are taking the medicine. Pregnancy If you are about to stop having your period (premenopausal) and you may become pregnant, seek counseling before you get pregnant. Take 400 to 800 micrograms (mcg) of folic acid every day if you become pregnant. Ask for birth control (contraception) if you want to prevent pregnancy. Osteoporosis and menopause Osteoporosis is a disease in which the bones lose minerals and strength with aging. This can result in bone fractures. If you are 17 years old or older, or if you are at risk for osteoporosis and fractures, ask your health care provider if you should: Be screened for bone loss. Take a calcium or vitamin D supplement to lower your risk of fractures. Be given hormone replacement therapy (HRT) to treat symptoms of menopause. Follow these instructions at home: Alcohol use Do not drink alcohol if: Your health care provider tells you not to drink. You are pregnant, may be pregnant, or are planning to become pregnant. If you drink alcohol: Limit how much you have to: 0-1 drink a day. Know how much alcohol is in  your drink. In the [place] one drink equals one 12 oz bottle of beer (355 mL), one 5 oz glass of wine (148 mL), or one 1 oz glass of hard liquor (44 mL). Lifestyle Do not use any products that contain nicotine or tobacco. These products include cigarettes, chewing tobacco, and vaping devices, such as e-cigarettes. If you need help quitting, ask your health care provider. Do not use street drugs. Do not share needles. Ask your health care provider for help if you need support or information about quitting drugs. General instructions Schedule regular health, dental, and eye exams. Stay current with your vaccines. Tell your health care provider if: You often feel depressed. You have ever been abused or do not feel safe at home. Summary Adopting a healthy lifestyle and getting preventive care are important in promoting health and wellness. Follow your health care provider's instructions about healthy diet, exercising, and getting tested or screened for diseases. Follow your health care provider's instructions on monitoring  your cholesterol and blood pressure. This information is not intended to replace advice given to you by your health care provider. Make sure you discuss any questions you have with your health care provider. Document Revised: 09/13/2020 Document Reviewed: 09/13/2020 Elsevier Patient Education  [place].

## 2021-04-11 NOTE — Progress Notes (Addendum)
I connected with  Yehuda Savannah on 04/12/21 by a audio enabled telemedicine application and verified that I am speaking with the correct person using two identifiers.  Patient Location: Home  Provider Location: Office/Clinic  I discussed the limitations of evaluation and management by telemedicine. The patient expressed understanding and agreed to proceed.  Subjective:   Colleen [name] is a 75 y.o. female who presents for Medicare Annual (Subsequent) preventive examination.  Review of Systems     Cardiac Risk Factors include: advanced age (>53men, >36 women);hypertension;dyslipidemia     Objective:    Today's Vitals   04/12/21 0919  PainSc: 0-No pain   There is no height or weight on file to calculate BMI.  Advanced Directives 01/27/2021 04/09/2020 02/27/2020 08/20/2017 11/15/2016 07/13/2016 01/21/2015  Does Patient Have a Medical Advance Directive? No No No No No No No  Does patient want to make changes to medical advance directive? - - - Yes (MAU/Ambulatory/Procedural Areas - Information given) - - -  Would patient like information on creating a medical advance directive? Yes (MAU/Ambulatory/Procedural Areas - Information given) No - Patient declined Yes (MAU/Ambulatory/Procedural Areas - Information given) - No - Patient declined Yes (MAU/Ambulatory/Procedural Areas - Information given) No - patient declined information    Current Medications (verified) Outpatient Encounter Medications as of 04/12/2021  Medication Sig   acetaminophen (TYLENOL) 650 MG CR tablet Take 650 mg by mouth every 8 (eight) hours as needed for pain.    Artificial Tear Solution (SOOTHE XP) SOLN Place 1 application into both eyes 3 (three) times daily.    Ascorbic Acid (VITAMIN C) 1000 MG tablet Take 1,000 mg by mouth 2 (two) times a week.    Calcium Carb-Cholecalciferol 600-800 MG-UNIT TABS Take 1 tablet by mouth daily.    Cholecalciferol (VITAMIN D3) 125 MCG (5000 UT) CAPS Take 5,000 Units by mouth daily.     Coenzyme Q10 (COQ10) 200 MG CAPS Take 200 mg by mouth daily.   diphenhydrAMINE (BENADRYL) 25 MG tablet Take 25 mg by mouth daily as needed for allergies.    latanoprost (XALATAN) 0.005 % ophthalmic solution Place 1 drop into both eyes at bedtime.   lisinopril-hydrochlorothiazide (ZESTORETIC) 20-25 MG tablet TAKE 1 TABLET EVERY DAY   Multiple Vitamin (MULTIVITAMIN WITH MINERALS) TABS tablet Take 1 tablet by mouth daily.   omeprazole (PRILOSEC) 20 MG capsule Take 20mg  30 minutes before a meal once or twice daily as needed.   oseltamivir (TAMIFLU) 75 MG capsule Take 1 capsule (75 mg total) by mouth 2 (two) times daily.   polyethylene glycol powder (MIRALAX) 17 GM/SCOOP powder Take one capful twice daily until soft bowel movement, then continue once daily as needed to maintain regular bowel movement. (Patient taking differently: Twice a week)   rosuvastatin (CRESTOR) 5 MG tablet Take 1 tablet (5 mg total) by mouth See admin instructions. Take 1 tablet (5 mg) by mouth on Mondays, Wednesdays, Fridays, & Sundays in the morning.   No facility-administered encounter medications on file as of 04/12/2021.    Allergies (verified) Aspirin, Fenofibrate, Statins, and Flonase [fluticasone]   History: Past Medical History:  Diagnosis Date   GERD (gastroesophageal reflux disease)    Glaucoma    Hyperlipidemia    Hypertension    Past Surgical History:  Procedure Laterality Date   CHOLECYSTECTOMY     CHONDROPLASTY Left 12/11/2014   Procedure: CHONDROPLASTY LEFT MEDIAL FEMORAL CONDYLE;  Surgeon: 02/10/2015, MD;  Location: [facility];  Service: Orthopedics;  Laterality: Left;  COLONOSCOPY WITH PROPOFOL N/A 02/27/2020   Carver: Fair prep, diverticulosis, nonbleeding internal hemorrhoids.  Consider Next colonoscopy 10 years   KNEE ARTHROSCOPY WITH LATERAL MENISECTOMY Left 12/11/2014   Procedure: KNEE ARTHROSCOPY WITH LATERAL AND MEDIAL MENISECTOMY;  Surgeon: Vickki Hearing, MD;  Location: [facility];   Service: Orthopedics;  Laterality: Left;   Family History  Problem Relation Age of Onset   Hypertension Mother    Heart disease Mother 63       MI   Hypertension Father    Diabetes Father    Stroke Father    Hypertension Sister    Diabetes Sister    Multiple sclerosis Sister    Hypertension Sister    Diabetes Sister    Heart disease Sister    Kidney disease Sister    Hypertension Brother    Schizophrenia Brother 50   Social History   Socioeconomic History   Marital status: Married    Spouse name: Not on file   Number of children: Not on file   Years of education: Not on file   Highest education level: 12th grade  Occupational History   Not on file  Tobacco Use   Smoking status: Former   Smokeless tobacco: Never  Building services engineer Use: Never used  Substance and Sexual Activity   Alcohol use: No   Drug use: No   Sexual activity: Never  Other Topics Concern   Not on file  Social History Narrative   Not on file   Social Determinants of Health   Financial Resource Strain: Not on file  Food Insecurity: No Food Insecurity   Worried About Running Out of Food in the Last Year: Never true   Ran Out of Food in the Last Year: Never true  Transportation Needs: No Transportation Needs   Lack of Transportation (Medical): No   Lack of Transportation (Non-Medical): No  Physical Activity: Insufficiently Active   Days of Exercise per Week: 3 days   Minutes of Exercise per Session: 30 min  Stress: Not on file  Social Connections: Moderately Isolated   Frequency of Communication with Friends and Family: Three times a week   Frequency of Social Gatherings with Friends and Family: Three times a week   Attends Religious Services: Never   Active Member of Clubs or Organizations: No   Attends Banker Meetings: Never   Marital Status: Married    Tobacco Counseling Counseling given: Not Answered   Clinical Intake:  Pre-visit preparation completed:  Yes  Pain : No/denies pain Pain Score: 0-No pain     Nutritional Status: BMI of 19-24  Normal Diabetes: No  How often do you need to have someone help you when you read instructions, pamphlets, or other written materials from your doctor or pharmacy?: 1 - Never  Diabetic? no  Interpreter Needed?: No      Activities of Daily Living In your present state of health, do you have any difficulty performing the following activities: 04/12/2021  Hearing? N  Vision? Y  Difficulty concentrating or making decisions? Y  Walking or climbing stairs? N  Dressing or bathing? N  Doing errands, shopping? N  Preparing Food and eating ? N  Using the Toilet? N  In the past six months, have you accidently leaked urine? N  Do you have problems with loss of bowel control? N  Managing your Medications? N  Managing your Finances? N  Housekeeping or managing your Housekeeping? N  Some recent data might  be hidden    Patient Care Team: Kerri Perches, MD as PCP - General Diona Browner Illene Bolus, MD as PCP - Cardiology (Cardiology) Vickki Hearing, MD as Consulting Physician (Orthopedic Surgery) Lanelle Bal, DO as Consulting Physician (Internal Medicine) Audrie Gallus, RN as [facility] Care Management  Indicate any recent Medical Services you may have received from other than [facility] providers in the past year (date may be approximate).     Assessment:   This is a routine wellness examination for Ferguson.  Hearing/Vision screen No results found.  Dietary issues and exercise activities discussed: Current Exercise Habits: Home exercise routine, Type of exercise: stretching, Time (Minutes): 30, Frequency (Times/Week): 3, Weekly Exercise (Minutes/Week): 90, Intensity: Mild, Exercise limited by: None identified   Goals Addressed             This Visit's Progress    Increase physical activity       Increase physical activity to 4-5 days per week as able     Patient  Stated       Do brain teasers and word searches to help memory and keep brain healthy       Depression Screen PHQ 2/9 Scores 04/12/2021 03/22/2021 01/27/2021 01/27/2021 11/16/2020 05/19/2020 04/09/2020  PHQ - 2 Score 0 1 0 0 1 1 0  PHQ- 9 Score - 1 - - - - -    Fall Risk Fall Risk  04/12/2021 03/22/2021 01/27/2021 11/16/2020 05/19/2020  Falls in the past year? 0 1 0 0 0  Number falls in past yr: 0 0 - 0 0  Injury with Fall? 0 0 - 0 0  Risk for fall due to : - - - No Fall Risks No Fall Risks  Follow up - - - Falls evaluation completed Falls evaluation completed    FALL RISK PREVENTION PERTAINING TO THE HOME:  Any stairs in or around the home? Yes  If so, are there any without handrails? Yes  Home free of loose throw rugs in walkways, pet beds, electrical cords, etc? No  Adequate lighting in your home to reduce risk of falls? No   ASSISTIVE DEVICES UTILIZED TO PREVENT FALLS:  Life alert? Yes  Use of a cane, walker or w/c? Yes  Grab bars in the bathroom? Yes  Shower chair or bench in shower? No  Elevated toilet seat or a handicapped toilet? No   Cognitive Function:     6CIT Screen 04/12/2021 04/09/2020 08/23/2018 08/20/2017 07/13/2016  What Year? 0 points 0 points 0 points 0 points 0 points  What month? 0 points 0 points 0 points 0 points 0 points  What time? 0 points 0 points 0 points 0 points 0 points  Count back from 20 0 points 0 points 0 points 0 points 0 points  Months in reverse 0 points 0 points 0 points 0 points 0 points  Repeat phrase 0 points 0 points 4 points 4 points 0 points  Total Score 0 0 4 4 0    Immunizations Immunization History  Administered Date(s) Administered   Fluad Quad(high Dose 65+) 01/06/2019, 01/16/2020, 02/11/2021   Influenza Split 03/08/2011   Influenza Whole 01/22/2007, 02/04/2009, 01/12/2010   Influenza,inj,Quad PF,6+ Mos 01/29/2013, 03/16/2014, 03/09/2015, 01/07/2016, 01/15/2017, 01/24/2018   Moderna Sars-Covid-2 Vaccination 06/04/2019,  07/02/2019, 03/23/2020, 09/21/2020, 02/06/2021   Pneumococcal Conjugate-13 11/12/2014   Pneumococcal Polysaccharide-23 06/26/2012   Td 10/22/2003   Zoster Recombinat (Shingrix) 10/20/2019, 12/24/2019   Zoster, Live 09/02/2007    TDAP status: Due,  Education has been provided regarding the importance of this vaccine. Advised may receive this vaccine at local pharmacy or Health Dept. Aware to provide a copy of the vaccination record if obtained from local pharmacy or Health Dept. Verbalized acceptance and understanding.  Flu Vaccine status: Up to date  Pneumococcal vaccine status: Up to date  Covid-19 vaccine status: Completed vaccines  Qualifies for Shingles Vaccine? Yes   Zostavax completed Yes   Shingrix Completed?: No.    Education has been provided regarding the importance of this vaccine. Patient has been advised to call insurance company to determine out of pocket expense if they have not yet received this vaccine. Advised may also receive vaccine at local pharmacy or Health Dept. Verbalized acceptance and understanding.  Screening Tests Health Maintenance  Topic Date Due   TETANUS/TDAP  03/22/2022 (Originally 10/21/2013)   MAMMOGRAM  01/04/2023   COLONOSCOPY (Pts 45-51yrs Insurance coverage will need to be confirmed)  02/26/2030   Pneumonia Vaccine 47+ Years old  Completed   INFLUENZA VACCINE  Completed   DEXA SCAN  Completed   COVID-19 Vaccine  Completed   Hepatitis C Screening  Completed   Zoster Vaccines- Shingrix  Completed   HPV VACCINES  Aged Out    Health Maintenance  There are no preventive care reminders to display for this patient.   Colorectal cancer screening: Type of screening: Colonoscopy. Completed 2021. Repeat every 10 years  Mammogram status: Completed 2022. Repeat every year  Bone Density status: Completed yes. Results reflect: Bone density results: NORMAL. Repeat every 2 years. osteopenia  Lung Cancer Screening: (Low Dose CT Chest recommended if  Age 27-80 years, 30 pack-year currently smoking OR have quit w/in 15years.) does not qualify.   Lung Cancer Screening Referral: na  Additional Screening:  Hepatitis C Screening: does not qualify; Completed once  Vision Screening: Recommended annual ophthalmology exams for early detection of glaucoma and other disorders of the eye. Is the patient up to date with their annual eye exam?  Yes  Who is the provider or what is the name of the office in which the patient attends annual eye exams? [email or web address] If pt is not established with a provider, would they like to be referred to a provider to establish care? No .   Dental Screening: Recommended annual dental exams for proper oral hygiene  Community Resource Referral / Chronic Care Management: CRR required this visit?  No   CCM required this visit?  No      Plan:     I have personally reviewed and noted the following in the patient's chart:   Medical and social history Use of alcohol, tobacco or illicit drugs  Current medications and supplements including opioid prescriptions.  Functional ability and status Nutritional status Physical activity Advanced directives List of other physicians Hospitalizations, surgeries, and ER visits in previous 12 months Vitals Screenings to include cognitive, depression, and falls Referrals and appointments  In addition, I have reviewed and discussed with patient certain preventive protocols, quality metrics, and best practice recommendations. A written personalized care plan for preventive services as well as general preventive health recommendations were provided to patient.     Everitt Amber, LPN, LPN   [id number]   Nurse Notes:  Ms. Cureton , Thank you for taking time to come for your Medicare Wellness Visit. I appreciate your ongoing commitment to your health goals. Please review the following plan we discussed and let me know if I can assist you in the future.   These  are the goals we  discussed:  Goals      Increase physical activity     Increase physical activity to 4-5 days per week as able     Patient Stated     Do brain teasers and word searches to help memory and keep brain healthy        This is a list of the screening recommended for you and due dates:  Health Maintenance  Topic Date Due   Tetanus Vaccine  03/22/2022*   Mammogram  01/04/2023   Colon Cancer Screening  02/26/2030   Pneumonia Vaccine  Completed   Flu Shot  Completed   DEXA scan (bone density measurement)  Completed   COVID-19 Vaccine  Completed   Hepatitis C Screening: USPSTF Recommendation to screen - Ages 29-79 yo.  Completed   Zoster (Shingles) Vaccine  Completed   HPV Vaccine  Aged Out  *Topic was postponed. The date shown is not the original due date.

## 2021-04-12 ENCOUNTER — Other Ambulatory Visit: Payer: Self-pay

## 2021-04-12 ENCOUNTER — Ambulatory Visit (INDEPENDENT_AMBULATORY_CARE_PROVIDER_SITE_OTHER): Payer: Medicare HMO

## 2021-04-12 DIAGNOSIS — Z0000 Encounter for general adult medical examination without abnormal findings: Secondary | ICD-10-CM | POA: Diagnosis not present

## 2021-04-23 ENCOUNTER — Other Ambulatory Visit: Payer: Self-pay | Admitting: Family Medicine

## 2021-04-23 DIAGNOSIS — E785 Hyperlipidemia, unspecified: Secondary | ICD-10-CM

## 2021-04-28 ENCOUNTER — Telehealth: Payer: Self-pay | Admitting: Family Medicine

## 2021-04-28 ENCOUNTER — Other Ambulatory Visit: Payer: Self-pay

## 2021-04-28 MED ORDER — OMEPRAZOLE 20 MG PO CPDR
DELAYED_RELEASE_CAPSULE | ORAL | 1 refills | Status: DC
Start: 2021-04-28 — End: 2023-02-14

## 2021-04-28 NOTE — Telephone Encounter (Signed)
Please send in Prilosec for pt

## 2021-05-31 ENCOUNTER — Ambulatory Visit (INDEPENDENT_AMBULATORY_CARE_PROVIDER_SITE_OTHER): Payer: Medicare HMO | Admitting: *Deleted

## 2021-05-31 ENCOUNTER — Telehealth: Payer: Self-pay | Admitting: *Deleted

## 2021-05-31 DIAGNOSIS — I10 Essential (primary) hypertension: Secondary | ICD-10-CM

## 2021-05-31 DIAGNOSIS — E785 Hyperlipidemia, unspecified: Secondary | ICD-10-CM

## 2021-05-31 NOTE — Chronic Care Management (AMB) (Signed)
Chronic Care Management   CCM RN Visit Note  05/31/2021 Name: Colleen [name] MRN: [id number] DOB: 1945-11-27  Subjective: Colleen [name] is a 76 y.o. year old female who is a primary care patient of Fayrene Helper, MD. The care management team was consulted for assistance with disease management and care coordination needs.    Engaged with patient by telephone for follow up visit in response to provider referral for case management and/or care coordination services.   Consent to Services:  The patient was given information about Chronic Care Management services, agreed to services, and gave verbal consent prior to initiation of services.  Please see initial visit note for detailed documentation.   Patient agreed to services and verbal consent obtained.   Assessment: Review of patient past medical history, allergies, medications, health status, including review of consultants reports, laboratory and other test data, was performed as part of comprehensive evaluation and provision of chronic care management services.   SDOH (Social Determinants of Health) assessments and interventions performed:    CCM Care Plan  Allergies  Allergen Reactions   Aspirin Nausea Only    Upset stomach (higher doses)   Fenofibrate Other (See Comments)    Muscle aches   Statins Other (See Comments)    Muscle cramps   Flonase [Fluticasone] Other (See Comments)    Caused increased pressure in the eye    Outpatient Encounter Medications as of 05/31/2021  Medication Sig   acetaminophen (TYLENOL) 650 MG CR tablet Take 650 mg by mouth every 8 (eight) hours as needed for pain.    Artificial Tear Solution (SOOTHE XP) SOLN Place 1 application into both eyes 3 (three) times daily.    Ascorbic Acid (VITAMIN C) 1000 MG tablet Take 1,000 mg by mouth 2 (two) times a week.    Calcium Carb-Cholecalciferol 600-800 MG-UNIT TABS Take 1 tablet by mouth daily.    Cholecalciferol (VITAMIN D3) 125 MCG (5000 UT) CAPS Take  5,000 Units by mouth daily.    Coenzyme Q10 (COQ10) 200 MG CAPS Take 200 mg by mouth daily.   diphenhydrAMINE (BENADRYL) 25 MG tablet Take 25 mg by mouth daily as needed for allergies.    latanoprost (XALATAN) 0.005 % ophthalmic solution Place 1 drop into both eyes at bedtime.   lisinopril-hydrochlorothiazide (ZESTORETIC) 20-25 MG tablet TAKE 1 TABLET EVERY DAY   Multiple Vitamin (MULTIVITAMIN WITH MINERALS) TABS tablet Take 1 tablet by mouth daily.   omeprazole (PRILOSEC) 20 MG capsule Take 61m 30 minutes before a meal once or twice daily as needed.   polyethylene glycol powder (MIRALAX) 17 GM/SCOOP powder Take one capful twice daily until soft bowel movement, then continue once daily as needed to maintain regular bowel movement. (Patient taking differently: Twice a week)   rosuvastatin (CRESTOR) 5 MG tablet TAKE 1 TABLET ON MONDAY, WEDNESDAY, FRIDAY AND SUNDAY IN THE MORNING   oseltamivir (TAMIFLU) 75 MG capsule Take 1 capsule (75 mg total) by mouth 2 (two) times daily. (Patient not taking: Reported on 05/31/2021)   No facility-administered encounter medications on file as of 05/31/2021.    Patient Active Problem List   Diagnosis Date Noted   Influenza A 03/22/2021   Constipation 08/23/2020   Overweight (BMI 25.0-29.9) 11/20/2018   Abnormal finding on EKG 03/16/2018   Neck pain on right side 03/11/2018   Hip pain, right 03/11/2018   Back pain with radiation 03/11/2018   Primary osteoarthritis of left knee    IFG (impaired fasting glucose) 10/13/2013   ALLERGIC  RHINITIS, SEASONAL 03/30/2008   Hyperlipidemia LDL goal <100 09/17/2007   Essential hypertension 09/17/2007   GERD 09/17/2007   Osteopenia 09/17/2007    Conditions to be addressed/monitored:HTN and HLD  Care Plan : RN Care Manager plan of care  Updates made by Kassie Mends, RN since 05/31/2021 12:00 AM     Problem: No plan of care established for management of chronic disease states (HTN, HLD)   Priority: High      Long-Range Goal: Development of plan of care for chronic disease management (HTN, HLD)   Start Date: 03/29/2021  Expected End Date: 09/25/2021  Priority: High  Note:   Current Barriers:  Knowledge Deficits related to plan of care for management of HTN and HLD  Patient reports she lives with her spouse, has adult children she can call on if needed, is independent in all aspects of her care, continues to drive, does light exercises sometimes at home, usually eats a regular diet, trying to drink more water.  Reports does not have advance directives and did receive documents in the mail but has not completed. Patient reports she has obtained blood pressure cuff and is checking blood pressure several times per week ASCVD risk enhancing conditions: age >46, HTN Patient reports she has had chronic issues with constipation and is seeing gastroenterologist, taking medications and states "this has helped"  RNCM Clinical Goal(s):  Patient will verbalize understanding of plan for management of HTN and HLD as evidenced by patient report, review EHR and  through collaboration with RN Care manager, provider, and care team.   Interventions: 1:1 collaboration with primary care provider regarding development and update of comprehensive plan of care as evidenced by provider attestation and co-signature Inter-disciplinary care team collaboration (see longitudinal plan of care) Evaluation of current treatment plan related to  self management and patient's adherence to plan as established by provider  Hypertension Interventions: Last practice recorded BP readings:  BP Readings from Last 3 Encounters:  03/22/21 (!) 144/85  12/24/20 132/77  11/16/20 129/72  Most recent eGFR/CrCl:  Lab Results  Component Value Date   EGFR 64 11/09/2020    No components found for: CRCL  Evaluation of current treatment plan related to hypertension self management and patient's adherence to plan as established by  provider Reviewed medications with patient and discussed importance of compliance Counseled on the importance of exercise goals with target of 150 minutes per week Discussed plans with patient for ongoing care management follow up and provided patient with direct contact information for care management team Reviewed upcoming scheduled appointments Encouraged patient to continue checking blood pressure and keeping a log Reviewed low sodium diet   Hyperlipidemia:  (Status: Goal on Track (progressing): YES.) Long Term Goal  Lab Results  Component Value Date   CHOL 158 11/09/2020   HDL 57 11/09/2020   LDLCALC 90 11/09/2020   TRIG 50 11/09/2020   CHOLHDL 3.7 03/10/2020     Medication review performed; medication list updated in electronic medical record.  Reviewed role and benefits of statin for ASCVD risk reduction; Reviewed importance of limiting foods high in cholesterol; Reinforced heart healthy diet and food choices  Patient Goals/Self-Care Activities: Take medications as prescribed   Attend all scheduled provider appointments Call pharmacy for medication refills 3-7 days in advance of running out of medications Perform IADL's (shopping, preparing meals, housekeeping, managing finances) independently Call provider office for new concerns or questions  check blood pressure 3 times per week write blood pressure results in a  log or diary learn about high blood pressure keep a blood pressure log take blood pressure log to all doctor appointments keep all doctor appointments take medications for blood pressure exactly as prescribed - call doctor with any symptoms you believe are related to your medicine - call doctor when you experience any new symptoms - adhere to prescribed diet: heart healthy, avoid trans/ saturated fats Continue checking blood pressure and keeping a log- keep up the good work  Follow Up Plan:  Telephone follow up appointment with care management team member  scheduled for:  08/23/2021       Plan:Telephone follow up appointment with care management team member scheduled for:  08/23/2021  Jacqlyn Larsen [facility], BSN RN Case Manager [facility] [phone]

## 2021-05-31 NOTE — Telephone Encounter (Signed)
°  Care Management   Follow Up Note   05/31/2021 Name: Colleen [name] MRN: [id number] DOB: 11/21/1945   Referred by: Kerri Perches, MD Reason for referral : Chronic Care Management (HTN, HLD)   An unsuccessful telephone outreach was attempted today. The patient was referred to the case management team for assistance with care management and care coordination.   Follow Up Plan: Telephone follow up appointment with care management team member scheduled for: upon care guide rescheduling.  Irving Shows [facility], BSN RN Case Manager [facility] [phone]

## 2021-05-31 NOTE — Patient Instructions (Signed)
Visit Information  Thank you for taking time to visit with me today. Please don't hesitate to contact me if I can be of assistance to you before our next scheduled telephone appointment.  Following are the goals we discussed today:  Attend all scheduled provider appointments Call pharmacy for medication refills 3-7 days in advance of running out of medications Perform IADL's (shopping, preparing meals, housekeeping, managing finances) independently Call provider office for new concerns or questions  check blood pressure 3 times per week write blood pressure results in a log or diary learn about high blood pressure keep a blood pressure log take blood pressure log to all doctor appointments keep all doctor appointments take medications for blood pressure exactly as prescribed call doctor with any symptoms you believe are related to your medicine call doctor when you experience any new symptoms adhere to prescribed diet: heart healthy, avoid trans/ saturated fats Continue checking blood pressure and keeping a log- keep up the good work  Our next appointment is by telephone on 08/23/2021 at 9 am  Please call the care guide team at [phone] if you need to cancel or reschedule your appointment.   If you are experiencing a Mental Health or Behavioral Health Crisis or need someone to talk to, please call the [place] National Suicide Prevention Lifeline: 305-250-8695 or TTY: 325-454-5039 TTY [phone]) to talk to a trained counselor call 1-800-273-TALK (toll free, 24 hour hotline) go to [facility] [address] [place] [phone]) call 911   Patient verbalizes understanding of instructions and care plan provided today and agrees to view in MyChart. Active MyChart status confirmed with patient.    Irving Shows [facility], BSN RN Case Manager [facility] [phone]

## 2021-06-07 ENCOUNTER — Other Ambulatory Visit: Payer: Self-pay

## 2021-06-07 ENCOUNTER — Ambulatory Visit: Payer: Medicare HMO | Admitting: *Deleted

## 2021-06-07 ENCOUNTER — Ambulatory Visit (INDEPENDENT_AMBULATORY_CARE_PROVIDER_SITE_OTHER): Payer: Medicare HMO | Admitting: Internal Medicine

## 2021-06-07 ENCOUNTER — Encounter: Payer: Self-pay | Admitting: Internal Medicine

## 2021-06-07 DIAGNOSIS — E785 Hyperlipidemia, unspecified: Secondary | ICD-10-CM

## 2021-06-07 DIAGNOSIS — I10 Essential (primary) hypertension: Secondary | ICD-10-CM

## 2021-06-07 DIAGNOSIS — Z20822 Contact with and (suspected) exposure to covid-19: Secondary | ICD-10-CM | POA: Diagnosis not present

## 2021-06-07 DIAGNOSIS — J069 Acute upper respiratory infection, unspecified: Secondary | ICD-10-CM

## 2021-06-07 NOTE — Progress Notes (Addendum)
Virtual Visit via Telephone Note   This visit type was conducted due to national recommendations for restrictions regarding the COVID-19 Pandemic (e.g. social distancing) in an effort to limit this patient's exposure and mitigate transmission in our community.  Due to her co-morbid illnesses, this patient is at least at moderate risk for complications without adequate follow up.  This format is felt to be most appropriate for this patient at this time.  The patient did not have access to video technology/had technical difficulties with video requiring transitioning to audio format only (telephone).  All issues noted in this document were discussed and addressed.  No physical exam could be performed with this format.  Evaluation Performed:  Follow-up visit  Date:  06/07/2021   ID:  Colleen [name], Colleen [name] 08-12-1945, MRN [id number]  Patient Location: Home Provider Location: Office/Clinic  Participants: Patient Location of Patient: Home Location of Provider: Telehealth Consent was obtain for visit to be over via telehealth. I verified that I am speaking with the correct person using two identifiers.  PCP:  Fayrene Helper, MD   Chief Complaint: Cough, runny nose and bodyache  History of Present Illness:    Colleen [name] is a 76 y.o. female who has a televisit for complaint of cough, runny nose, body aches and fatigue for last 3 days.  She denies any fever, chills, dyspnea or wheezing currently.  She has tried taking Coricidin with some relief.  Of note, she had influenza in 11/22.  The patient does have symptoms concerning for COVID-19 infection (fever, chills, cough, or new shortness of breath).   Past Medical, Surgical, Social History, Allergies, and Medications have been Reviewed.  Past Medical History:  Diagnosis Date   GERD (gastroesophageal reflux disease)    Glaucoma    Hyperlipidemia    Hypertension    Past Surgical History:  Procedure Laterality Date    CHOLECYSTECTOMY     CHONDROPLASTY Left 12/11/2014   Procedure: CHONDROPLASTY LEFT MEDIAL FEMORAL CONDYLE;  Surgeon: Carole Civil, MD;  Location: [facility];  Service: Orthopedics;  Laterality: Left;   COLONOSCOPY WITH PROPOFOL N/A 02/27/2020   [place]: Fair prep, diverticulosis, nonbleeding internal hemorrhoids.  Consider Next colonoscopy 10 years   KNEE ARTHROSCOPY WITH LATERAL MENISECTOMY Left 12/11/2014   Procedure: KNEE ARTHROSCOPY WITH LATERAL AND MEDIAL MENISECTOMY;  Surgeon: Carole Civil, MD;  Location: [facility];  Service: Orthopedics;  Laterality: Left;     Current Meds  Medication Sig   acetaminophen (TYLENOL) 650 MG CR tablet Take 650 mg by mouth every 8 (eight) hours as needed for pain.    Artificial Tear Solution (SOOTHE XP) SOLN Place 1 application into both eyes 3 (three) times daily.    Ascorbic Acid (VITAMIN C) 1000 MG tablet Take 1,000 mg by mouth 2 (two) times a week.    Calcium Carb-Cholecalciferol 600-800 MG-UNIT TABS Take 1 tablet by mouth daily.    Cholecalciferol (VITAMIN D3) 125 MCG (5000 UT) CAPS Take 5,000 Units by mouth daily.    Coenzyme Q10 (COQ10) 200 MG CAPS Take 200 mg by mouth daily.   diphenhydrAMINE (BENADRYL) 25 MG tablet Take 25 mg by mouth daily as needed for allergies.    latanoprost (XALATAN) 0.005 % ophthalmic solution Place 1 drop into both eyes at bedtime.   lisinopril-hydrochlorothiazide (ZESTORETIC) 20-25 MG tablet TAKE 1 TABLET EVERY DAY   Multiple Vitamin (MULTIVITAMIN WITH MINERALS) TABS tablet Take 1 tablet by mouth daily.   omeprazole (PRILOSEC) 20 MG capsule Take  20mg  30 minutes before a meal once or twice daily as needed.   polyethylene glycol powder (MIRALAX) 17 GM/SCOOP powder Take one capful twice daily until soft bowel movement, then continue once daily as needed to maintain regular bowel movement. (Patient taking differently: Twice a week)   rosuvastatin (CRESTOR) 5 MG tablet TAKE 1 TABLET ON MONDAY, WEDNESDAY, FRIDAY AND SUNDAY IN THE  MORNING     Allergies:   Aspirin, Fenofibrate, Statins, and Flonase [fluticasone]   ROS:   Please see the history of present illness.     All other systems reviewed and are negative.   Labs/Other Tests and Data Reviewed:    Recent Labs: 11/09/2020: ALT 18; BUN 21; Creatinine, Ser 0.94; Hemoglobin 12.1; Platelets 210; Potassium 4.1; Sodium 140   Recent Lipid Panel Lab Results  Component Value Date/Time   CHOL 158 11/09/2020 08:26 AM   TRIG 50 11/09/2020 08:26 AM   HDL 57 11/09/2020 08:26 AM   CHOLHDL 3.7 03/10/2020 09:43 AM   LDLCALC 90 11/09/2020 08:26 AM   LDLCALC 122 (H) 03/10/2020 09:43 AM    Wt Readings from Last 3 Encounters:  03/22/21 139 lb 1.3 oz (63.1 kg)  12/24/20 138 lb (62.6 kg)  11/16/20 136 lb (61.7 kg)     ASSESSMENT & PLAN:    URTI Suspected COVID-19 infection Symptoms likely from viral URTI Check COVID RT-PCR Continue Coricidin or Mucinex as needed for cough Nasal saline spray as needed for nasal congestion  Time:   Today, I have spent 9 minutes reviewing the chart, including problem list, medications, and with the patient with telehealth technology discussing the above problems.   Medication Adjustments/Labs and Tests Ordered: Current medicines are reviewed at length with the patient today.  Concerns regarding medicines are outlined above.   Tests Ordered: No orders of the defined types were placed in this encounter.   Medication Changes: No orders of the defined types were placed in this encounter.    Note: This dictation was prepared with Dragon dictation along with smaller phrase technology. Similar sounding words can be transcribed inadequately or may not be corrected upon review. Any transcriptional errors that result from this process are unintentional.      Disposition:  Follow up  Signed, Lindell Spar, MD  06/07/2021 11:43 AM     [place]

## 2021-06-07 NOTE — Patient Instructions (Signed)
Please come to office for COVID testing.

## 2021-06-08 ENCOUNTER — Ambulatory Visit: Payer: Medicare HMO

## 2021-06-09 ENCOUNTER — Other Ambulatory Visit: Payer: Self-pay | Admitting: Internal Medicine

## 2021-06-09 ENCOUNTER — Telehealth: Payer: Self-pay

## 2021-06-09 DIAGNOSIS — J069 Acute upper respiratory infection, unspecified: Secondary | ICD-10-CM

## 2021-06-09 LAB — NOVEL CORONAVIRUS, NAA: SARS-CoV-2, NAA: NOT DETECTED

## 2021-06-09 LAB — SARS-COV-2, NAA 2 DAY TAT

## 2021-06-09 MED ORDER — PROMETHAZINE-DM 6.25-15 MG/5ML PO SYRP: 5.0000 mL | ORAL_SOLUTION | Freq: Four times a day (QID) | ORAL | 0 refills | Status: DC | PRN

## 2021-06-09 NOTE — Telephone Encounter (Signed)
Patient called about covid results.

## 2021-06-09 NOTE — Telephone Encounter (Signed)
Please call the pt regarding covid test @ [phone]

## 2021-06-09 NOTE — Telephone Encounter (Signed)
Pt advised with verbal understanding  °

## 2021-06-13 ENCOUNTER — Telehealth: Payer: Self-pay

## 2021-06-13 DIAGNOSIS — E785 Hyperlipidemia, unspecified: Secondary | ICD-10-CM | POA: Diagnosis not present

## 2021-06-13 DIAGNOSIS — I10 Essential (primary) hypertension: Secondary | ICD-10-CM | POA: Diagnosis not present

## 2021-06-13 NOTE — Telephone Encounter (Signed)
Patient returning call about results.  

## 2021-06-14 LAB — CMP14+EGFR
ALT: 42 IU/L — ABNORMAL HIGH (ref 0–32)
AST: 43 IU/L — ABNORMAL HIGH (ref 0–40)
Albumin/Globulin Ratio: 1.9 (ref 1.2–2.2)
Albumin: 4.6 g/dL (ref 3.7–4.7)
Alkaline Phosphatase: 98 IU/L (ref 44–121)
BUN/Creatinine Ratio: 18 (ref 12–28)
BUN: 16 mg/dL (ref 8–27)
Bilirubin Total: 0.4 mg/dL (ref 0.0–1.2)
CO2: 24 mmol/L (ref 20–29)
Calcium: 9.8 mg/dL (ref 8.7–10.3)
Chloride: 102 mmol/L (ref 96–106)
Creatinine, Ser: 0.9 mg/dL (ref 0.57–1.00)
Globulin, Total: 2.4 g/dL (ref 1.5–4.5)
Glucose: 93 mg/dL (ref 70–99)
Potassium: 4 mmol/L (ref 3.5–5.2)
Sodium: 140 mmol/L (ref 134–144)
Total Protein: 7 g/dL (ref 6.0–8.5)
eGFR: 67 mL/min/{1.73_m2} (ref >59)

## 2021-06-14 LAB — TSH: TSH: 3.36 u[IU]/mL (ref 0.450–4.500)

## 2021-06-16 ENCOUNTER — Other Ambulatory Visit (HOSPITAL_COMMUNITY): Payer: Self-pay | Admitting: Family Medicine

## 2021-06-16 ENCOUNTER — Encounter: Payer: Self-pay | Admitting: Orthopedic Surgery

## 2021-06-16 ENCOUNTER — Ambulatory Visit (HOSPITAL_COMMUNITY)
Admission: RE | Admit: 2021-06-16 | Discharge: 2021-06-16 | Disposition: A | Discharge status: Home / Self Care | Payer: Medicare HMO | Source: Ambulatory Visit | Attending: Family Medicine | Admitting: Family Medicine

## 2021-06-16 ENCOUNTER — Encounter: Payer: Self-pay | Admitting: Family Medicine

## 2021-06-16 ENCOUNTER — Other Ambulatory Visit: Payer: Self-pay

## 2021-06-16 ENCOUNTER — Ambulatory Visit (INDEPENDENT_AMBULATORY_CARE_PROVIDER_SITE_OTHER): Payer: Medicare HMO | Admitting: Family Medicine

## 2021-06-16 VITALS — BP 137/74 | HR 81 | Resp 17 | Ht 62.0 in | Wt 137.0 lb

## 2021-06-16 DIAGNOSIS — Z0000 Encounter for general adult medical examination without abnormal findings: Secondary | ICD-10-CM | POA: Diagnosis not present

## 2021-06-16 DIAGNOSIS — E785 Hyperlipidemia, unspecified: Secondary | ICD-10-CM

## 2021-06-16 DIAGNOSIS — G8929 Other chronic pain: Secondary | ICD-10-CM

## 2021-06-16 DIAGNOSIS — R053 Chronic cough: Secondary | ICD-10-CM | POA: Insufficient documentation

## 2021-06-16 DIAGNOSIS — I10 Essential (primary) hypertension: Secondary | ICD-10-CM

## 2021-06-16 DIAGNOSIS — R5383 Other fatigue: Secondary | ICD-10-CM | POA: Diagnosis not present

## 2021-06-16 DIAGNOSIS — R0683 Snoring: Secondary | ICD-10-CM

## 2021-06-16 DIAGNOSIS — M25511 Pain in right shoulder: Secondary | ICD-10-CM | POA: Diagnosis not present

## 2021-06-16 DIAGNOSIS — Z1231 Encounter for screening mammogram for malignant neoplasm of breast: Secondary | ICD-10-CM

## 2021-06-16 DIAGNOSIS — R059 Cough, unspecified: Secondary | ICD-10-CM | POA: Diagnosis not present

## 2021-06-16 DIAGNOSIS — M25562 Pain in left knee: Secondary | ICD-10-CM | POA: Diagnosis not present

## 2021-06-16 DIAGNOSIS — E559 Vitamin D deficiency, unspecified: Secondary | ICD-10-CM

## 2021-06-16 LAB — LIPID PANEL W/O CHOL/HDL RATIO
Cholesterol, Total: 167 mg/dL (ref 100–199)
HDL: 51 mg/dL (ref >39)
LDL Chol Calc (NIH): 104 mg/dL — ABNORMAL HIGH (ref 0–99)
Triglycerides: 62 mg/dL (ref 0–149)
VLDL Cholesterol Cal: 12 mg/dL (ref 5–40)

## 2021-06-16 LAB — SPECIMEN STATUS REPORT

## 2021-06-16 NOTE — Assessment & Plan Note (Signed)
POSTERIOR PAIN, STIFFNESS AND INSTABILITY X 6 MONTHS, REFER oRTHO

## 2021-06-16 NOTE — Assessment & Plan Note (Signed)
1 year history of pain and limitation in movement of right shoulder refer to orthopedics for evaluation.

## 2021-06-16 NOTE — Patient Instructions (Addendum)
Follow-up in 3 months call if you need me before. ° °Pls schedule August mammogram at checkout ° °You are referred to orthopedics for right shoulder pain and left knee pain. ° °You are referred to pulmonary for evaluation of snoring with fatigue. ° °Chest x-ray today because of chronic cough. ° °You are referred to cardiology because of progressive exercise intolerance with hypertension and hyperlipidemia. ° °Start one multivitamin once daily, eg Centrum  ° °Please reduce fried and fatty foods. ° °CBC, Chem-7 and EGFR  and Vit D to be drawn 5 days  prior to next visit. ° °Thanks for choosing [facility], we consider it a privelige to serve you. ° °

## 2021-06-16 NOTE — Assessment & Plan Note (Signed)
1 year history of progressive exertional fatigue.  Patient has hypertension hyperlipidemia and abnormal EKG referred to cardiology for further evaluation.

## 2021-06-16 NOTE — Assessment & Plan Note (Signed)

## 2021-06-16 NOTE — Assessment & Plan Note (Signed)
5 week h/o chronic cough and chest congestion, obtain CXR

## 2021-06-16 NOTE — Assessment & Plan Note (Signed)
Snoring with fatigue needs referral for sleep apnea evaluation.

## 2021-06-16 NOTE — Progress Notes (Signed)
° ° °  Colleen [name]     MRN: [id number]      DOB: 1945-10-13  HPI: Patient is in for annual physical exam. C/o progressive fatigue with poor exercise tolerance over the past 1 year. C/o left knee and right shoulder pain worse in past 3 to 4 months, no recent trauma C/o excess snoring and chronic fatigue C/o cough and chest congestion x 3 to 4 weeks, sputum is white, no fever or cills Recent labs,  are reviewed. Immunization is reviewed , and  updated if needed.   PE: BP 137/74    Pulse 81    Resp 17    Ht 5\' 2"  (1.575 m)    Wt 137 lb 0.6 oz (62.2 kg)    SpO2 97%    BMI 25.06 kg/m   Pleasant  female, alert and oriented x 3, in no cardio-pulmonary distress. Afebrile. HEENT No facial trauma or asymetry. Sinuses non tender.  Extra occullar muscles intact.. External ears normal, . Neck: supple, no adenopathy,JVD or thyromegaly.No bruits.  Chest: Clear to ascultation bilaterally.No crackles or wheezes. Non tender to palpation  Breast: No asymetry,no masses or lumps. No tenderness. No nipple discharge or inversion. No axillary or supraclavicular adenopathy  Cardiovascular system; Heart sounds normal,  S1 and  S2 ,no S3.  No murmur, or thrill. Apical beat not displaced Peripheral pulses normal.  Abdomen: Soft, non tender, no organomegaly or masses. No bruits. Bowel sounds normal. No guarding, tenderness or rebound.     Musculoskeletal exam: Full ROM of spine, hips ,  decreased in right shoulder and  left knee Mild deformity ,swelling or crepitus noted. No muscle wasting or atrophy.   Neurologic: Cranial nerves 2 to 12 intact. Power, tone ,sensation and reflexes normal throughout. No disturbance in gait. No tremor.  Skin: Intact, no ulceration, erythema , scaling or rash noted. Pigmentation normal throughout  Psych; Normal mood and affect. Judgement and concentration normal   Assessment & Plan:  Annual physical exam Annual exam as documented. Counseling done   re healthy lifestyle involving commitment to 150 minutes exercise per week, heart healthy diet, and attaining healthy weight.The importance of adequate sleep also discussed. Regular seat belt use and home safety, is also discussed. Changes in health habits are decided on by the patient with goals and time frames  set for achieving them. Immunization and cancer screening needs are specifically addressed at this visit.   Left knee pain POSTERIOR PAIN, STIFFNESS AND INSTABILITY X 6 MONTHS, REFER oRTHO  Snoring Snoring with fatigue needs referral for sleep apnea evaluation.  Fatigue 1 year history of progressive exertional fatigue.  Patient has hypertension hyperlipidemia and abnormal EKG referred to cardiology for further evaluation.  Right shoulder pain 1 year history of pain and limitation in movement of right shoulder refer to orthopedics for evaluation.  Chronic cough  5 week h/o chronic cough and chest congestion, obtain CXR

## 2021-06-27 NOTE — Progress Notes (Signed)
Primary Care Physician: Fayrene Helper, MD  Primary Gastroenterologist:  Elon Alas. Abbey Chatters, DO   Chief Complaint  Patient presents with   Gastroesophageal Reflux    Occ. Has some epigastric pain    HPI: Colleen [name] is a 76 y.o. female here for follow up. Last seen in 12/2020. H/o GERD, constipation, epigastric pain.  Patient last seen in August 2022.  Overall doing fairly well.  Her reflux is fairly well controlled, only occasional burning in the epigastric region.  She takes omeprazole 20 mg daily.  Denies any vomiting, dysphagia.  Bowel movements are regular.  No blood in the stool or melena.  Her only complaint is several week history of fatigue.  She had the flu in November which took her several weeks to get over her symptoms and cough.  Chest x-ray February 10 was unremarkable.  She had labs February 6 with no evidence of thyroid disease, kidney disease.  No recent CBC, CBC in July with no evidence of anemia.  Patient states she has sleep study planned in the near future.  She denies shortness of breath, lightheadedness or dizziness.  Recent labs showed mild elevation of AST/ALT which is new.  She denies any significant changes in her medication although she was given something for cough over the last several weeks.  Denies any antibiotic use.  She has been on Crestor for a while, denies dosage change.  Colonoscopy October 2021: -Fair prep -Diverticulosis -Nonbleeding internal hemorrhoids  Current Outpatient Medications  Medication Sig Dispense Refill   acetaminophen (TYLENOL) 650 MG CR tablet Take 650 mg by mouth every 8 (eight) hours as needed for pain.      Artificial Tear Solution (SOOTHE XP) SOLN Place 1 application into both eyes 3 (three) times daily.      Ascorbic Acid (VITAMIN C) 1000 MG tablet Take 1,000 mg by mouth 2 (two) times a week.      Calcium Carb-Cholecalciferol 600-800 MG-UNIT TABS Take 1 tablet by mouth daily.  120 tablet 11   Cholecalciferol  (VITAMIN D3) 125 MCG (5000 UT) CAPS Take 5,000 Units by mouth daily.      Coenzyme Q10 (COQ10) 200 MG CAPS Take 200 mg by mouth daily.     diphenhydrAMINE (BENADRYL) 25 MG tablet Take 25 mg by mouth daily as needed for allergies.      ibuprofen (ADVIL) 200 MG tablet Take 600-800 mg by mouth every 6 (six) hours as needed.     latanoprost (XALATAN) 0.005 % ophthalmic solution Place 1 drop into both eyes at bedtime.  12   lisinopril-hydrochlorothiazide (ZESTORETIC) 20-25 MG tablet TAKE 1 TABLET EVERY DAY 90 tablet 1   Multiple Vitamin (MULTIVITAMIN WITH MINERALS) TABS tablet Take 1 tablet by mouth daily.     omeprazole (PRILOSEC) 20 MG capsule Take 20mg  30 minutes before a meal once or twice daily as needed. 180 capsule 1   polyethylene glycol powder (MIRALAX) 17 GM/SCOOP powder Take one capful twice daily until soft bowel movement, then continue once daily as needed to maintain regular bowel movement. (Patient taking differently: Twice a week) 527 g 5   promethazine-dextromethorphan (PROMETHAZINE-DM) 6.25-15 MG/5ML syrup Take 5 mLs by mouth 4 (four) times daily as needed for cough. 118 mL 0   rosuvastatin (CRESTOR) 5 MG tablet TAKE 1 TABLET ON MONDAY, WEDNESDAY, FRIDAY AND SUNDAY IN THE MORNING 48 tablet 2   No current facility-administered medications for this visit.    Allergies as of 06/28/2021 - Review  Complete 06/28/2021  Allergen Reaction Noted   Aspirin Nausea Only 07/29/2008   Fenofibrate Other (See Comments) 01/07/2016   Statins Other (See Comments) 10/13/2013   Flonase [fluticasone] Other (See Comments) 02/27/2020    ROS:  General: Negative for anorexia, weight loss, fever, chills, positive fatigue, weakness. ENT: Negative for hoarseness, difficulty swallowing , nasal congestion. CV: Negative for chest pain, angina, palpitations, dyspnea on exertion, peripheral edema.  Respiratory: Negative for dyspnea at rest, dyspnea on exertion, cough, sputum, wheezing.  GI: See history of  present illness. GU:  Negative for dysuria, hematuria, urinary incontinence, urinary frequency, nocturnal urination.  Endo: Negative for unusual weight change.    Physical Examination:   BP 133/75    Pulse 67    Temp 97.7 F (36.5 C) (Temporal)    Ht 5\' 2"  (1.575 m)    Wt 138 lb 3.2 oz (62.7 kg)    BMI 25.28 kg/m   General: Well-nourished, well-developed in no acute distress.  Eyes: No icterus. Mouth: Oropharyngeal mucosa moist and pink , no lesions erythema or exudate. Lungs: Clear to auscultation bilaterally.  Heart: Regular rate and rhythm, no murmurs rubs or gallops.  Abdomen: Bowel sounds are normal, nontender, nondistended, no hepatosplenomegaly or masses, no abdominal bruits or hernia , no rebound or guarding.   Extremities: No lower extremity edema. No clubbing or deformities. Neuro: Alert and oriented x 4   Skin: Warm and dry, no jaundice.   Psych: Alert and cooperative, normal mood and affect.  Labs:  Lab Results  Component Value Date   CREATININE 0.90 06/13/2021   BUN 16 06/13/2021   NA 140 06/13/2021   K 4.0 06/13/2021   CL 102 06/13/2021   CO2 24 06/13/2021   Lab Results  Component Value Date   ALT 42 (H) 06/13/2021   AST 43 (H) 06/13/2021   ALKPHOS 98 06/13/2021   BILITOT 0.4 06/13/2021   Lab Results  Component Value Date   WBC 4.6 11/09/2020   HGB 12.1 11/09/2020   HCT 35.8 11/09/2020   MCV 91 11/09/2020   PLT 210 11/09/2020   No results found for: IRON, TIBC, FERRITIN Lab Results  Component Value Date   TSH 3.360 06/13/2021     Imaging Studies: DG Chest 2 View  Result Date: 06/17/2021 CLINICAL DATA:  Cough, hypertension and fatigue. EXAM: CHEST - 2 VIEW COMPARISON:  03/12/2018 FINDINGS: Cardiac silhouette is normal in size and configuration. Normal mediastinal and hilar contours. Clear lungs.  No pleural effusion or pneumothorax. Skeletal structures are intact IMPRESSION: No active cardiopulmonary disease. Electronically Signed   By: Lajean Manes M.D.   On: 06/17/2021 17:53     Assessment:  GERD/epigastric burning: Doing well on omeprazole 20 mg daily.  Patient previously declined endoscopy because her symptoms improved with medication.  Constipation: Controlled with MiraLAX as needed.  New transaminitis: New mild elevation of AST/ALT.  Patient denies any symptoms except for fatigue over the past several weeks.  She did get sick with flu in November and had prolonged coughing which has improved at this point.  Recent chest x-ray was negative.  Only new medication was "cough medication".  Denies any other changes in her medications.  No alcohol use.  Etiology unclear.  We will recheck in the next few weeks, if persistently elevated she will need further work-up otherwise if they are normal we will continue to monitor.  She is aware to let us know if she develops any change in appetite, nausea or vomiting, abdominal  pain, weight loss.   Plan: Continue MiraLAX daily as needed to manage constipation.   Continue omeprazole 20 mg daily.   Complete labs in the next 3 to 4 weeks.   Patient inquired about future colonoscopies.  Her last one was in 2021, prep was fair, no recommendations for early interval follow-up, screening exam recommended in 10 years.  She will be 84 at that time.  She could consider no future screening exams.  Patient states that she is getting information from her insurance about doing stool testing to screen for colon cancer which she has not completed.  Unless they require her to do it, she technically does not need any colon cancer screening at this point since her colonoscopy is up-to-date.

## 2021-06-28 ENCOUNTER — Telehealth: Payer: Self-pay | Admitting: Gastroenterology

## 2021-06-28 ENCOUNTER — Ambulatory Visit: Payer: Medicare HMO | Admitting: Gastroenterology

## 2021-06-28 ENCOUNTER — Encounter: Payer: Self-pay | Admitting: Gastroenterology

## 2021-06-28 ENCOUNTER — Other Ambulatory Visit: Payer: Self-pay

## 2021-06-28 VITALS — BP 133/75 | HR 67 | Temp 97.7°F | Ht 62.0 in | Wt 138.2 lb

## 2021-06-28 DIAGNOSIS — R7989 Other specified abnormal findings of blood chemistry: Secondary | ICD-10-CM | POA: Diagnosis not present

## 2021-06-28 DIAGNOSIS — K5900 Constipation, unspecified: Secondary | ICD-10-CM | POA: Diagnosis not present

## 2021-06-28 DIAGNOSIS — R5383 Other fatigue: Secondary | ICD-10-CM

## 2021-06-28 DIAGNOSIS — K219 Gastro-esophageal reflux disease without esophagitis: Secondary | ICD-10-CM

## 2021-06-28 NOTE — Telephone Encounter (Signed)
Colleen [name], patient seen in office today. She is going for labs later in March for abnormal LFTS. Please let her know that I didn't realize the her last hemoglobin was actually in 11/2020 NOT with labs done two weeks ago. Since she has significant fatigue, I would like to change plans. Let's have her go for LFTs AND CBC first week of March. Can you add the CBC order [facility]?

## 2021-06-28 NOTE — Telephone Encounter (Signed)
Lmom for pt to return my call. Lab was ordered and will be mailed out to pt to have completed first week of March.

## 2021-06-28 NOTE — Patient Instructions (Addendum)
Continue miralax daily as needed to manage constipation. Continue omeprazole 20 mg once daily. Please have your blood work done in 4 weeks (last week of March). If you develop poor appetite, nausea, abdominal pain, weight loss please let me know.

## 2021-06-29 NOTE — Telephone Encounter (Signed)
Pt was made aware and verbalized understanding.  

## 2021-07-06 ENCOUNTER — Other Ambulatory Visit: Payer: Self-pay | Admitting: Gastroenterology

## 2021-07-06 DIAGNOSIS — R7989 Other specified abnormal findings of blood chemistry: Secondary | ICD-10-CM | POA: Diagnosis not present

## 2021-07-07 LAB — HEPATIC FUNCTION PANEL
ALT: 17 IU/L (ref 0–32)
AST: 23 IU/L (ref 0–40)
Albumin: 5 g/dL — ABNORMAL HIGH (ref 3.7–4.7)
Alkaline Phosphatase: 96 IU/L (ref 44–121)
Bilirubin Total: 0.7 mg/dL (ref 0.0–1.2)
Bilirubin, Direct: 0.18 mg/dL (ref 0.00–0.40)
Total Protein: 7.9 g/dL (ref 6.0–8.5)

## 2021-07-07 LAB — CBC WITH DIFFERENTIAL/PLATELET
Basophils Absolute: 0 10*3/uL (ref 0.0–0.2)
Basos: 1 %
EOS (ABSOLUTE): 0.1 10*3/uL (ref 0.0–0.4)
Eos: 1 %
Hematocrit: 38.8 % (ref 34.0–46.6)
Hemoglobin: 13.3 g/dL (ref 11.1–15.9)
Immature Grans (Abs): 0 10*3/uL (ref 0.0–0.1)
Immature Granulocytes: 0 %
Lymphocytes Absolute: 2.3 10*3/uL (ref 0.7–3.1)
Lymphs: 46 %
MCH: 30.2 pg (ref 26.6–33.0)
MCHC: 34.3 g/dL (ref 31.5–35.7)
MCV: 88 fL (ref 79–97)
Monocytes Absolute: 0.3 10*3/uL (ref 0.1–0.9)
Monocytes: 6 %
Neutrophils Absolute: 2.4 10*3/uL (ref 1.4–7.0)
Neutrophils: 46 %
Platelets: 267 10*3/uL (ref 150–450)
RBC: 4.4 x10E6/uL (ref 3.77–5.28)
RDW: 12.8 % (ref 11.7–15.4)
WBC: 5.1 10*3/uL (ref 3.4–10.8)

## 2021-07-11 ENCOUNTER — Ambulatory Visit (INDEPENDENT_AMBULATORY_CARE_PROVIDER_SITE_OTHER): Payer: Medicare HMO | Admitting: Orthopedic Surgery

## 2021-07-11 ENCOUNTER — Ambulatory Visit: Payer: Medicare HMO

## 2021-07-11 ENCOUNTER — Encounter: Payer: Self-pay | Admitting: Internal Medicine

## 2021-07-11 ENCOUNTER — Other Ambulatory Visit: Payer: Self-pay

## 2021-07-11 ENCOUNTER — Telehealth: Payer: Self-pay | Admitting: Gastroenterology

## 2021-07-11 ENCOUNTER — Encounter: Payer: Self-pay | Admitting: Orthopedic Surgery

## 2021-07-11 VITALS — BP 147/66 | HR 75 | Ht 62.0 in | Wt 136.2 lb

## 2021-07-11 DIAGNOSIS — R7989 Other specified abnormal findings of blood chemistry: Secondary | ICD-10-CM

## 2021-07-11 DIAGNOSIS — M19011 Primary osteoarthritis, right shoulder: Secondary | ICD-10-CM

## 2021-07-11 DIAGNOSIS — M5090 Cervical disc disorder, unspecified, unspecified cervical region: Secondary | ICD-10-CM | POA: Diagnosis not present

## 2021-07-11 DIAGNOSIS — M19019 Primary osteoarthritis, unspecified shoulder: Secondary | ICD-10-CM | POA: Insufficient documentation

## 2021-07-11 DIAGNOSIS — G8929 Other chronic pain: Secondary | ICD-10-CM

## 2021-07-11 MED ORDER — GABAPENTIN 100 MG PO CAPS: 100.0000 mg | ORAL_CAPSULE | Freq: Three times a day (TID) | ORAL | 2 refills | Status: DC | PRN

## 2021-07-11 NOTE — Patient Instructions (Signed)
Recommend Tylenol 500 mg every 6 hours for pain ? ?For breakthrough pain 400 mg of ibuprofen every 8 hours as needed to address concern for elevated liver function test ? ?Start gabapentin 100 mg every 8 as needed for pain radiating to the forearm ? ? ? ?

## 2021-07-11 NOTE — Telephone Encounter (Signed)
Patient called asking about her lab results 

## 2021-07-11 NOTE — Telephone Encounter (Signed)
See result note.  

## 2021-07-11 NOTE — Progress Notes (Signed)
Chief Complaint  ?Patient presents with  ? Shoulder Pain  ?  Right, no injury, chronic, goes down to elbow  ? ? ?HPI: 76 year old female with some history of cervical disc disease comes in with atraumatic pain right shoulder radiates to right forearm associated with some right-sided neck symptoms.  Patient takes intermittent Advil ? ?Reports some elevation of LFTs on last laboratory exam by primary care ? ?Past Medical History:  ?Diagnosis Date  ? GERD (gastroesophageal reflux disease)   ? Glaucoma   ? Hyperlipidemia   ? Hypertension   ? ? ?BP (!) 147/66   Pulse 75   Ht 5\' 2"  (1.575 m)   Wt 136 lb 3.2 oz (61.8 kg)   BMI 24.91 kg/m?  ? ? ?General appearance: Well-developed well-nourished no gross deformities ? ?Cardiovascular normal pulse and perfusion normal color without edema ? ?Neurologically no sensation loss or deficits or pathologic reflexes ? ?Psychological: Awake alert and oriented x3 mood and affect normal ? ?Skin no lacerations or ulcerations no nodularity no palpable masses, no erythema or nodularity ? ?Musculoskeletal:  ?Right shoulder ?Normal range of motion negative Neer sign for impingement mild pain with the Hawkins maneuver ?Normal cuff strength ? ?My interpretation of her outside films ?Imaging outside images cervical spine: She had anterolisthesis of C4 on C5 with loss of disc space height at C5-6 and C6-7 consistent with degenerative disc disease ? ?Internal films were also done right shoulder small osteophyte inferior glenoid normal glenohumeral joint space mild AC joint arthritis expected for age ? ?Impression mild OA right shoulder ? ?A/P ? ?Encounter Diagnoses  ?Name Primary?  ? Chronic right shoulder pain Yes  ? Cervical disc disease   ? ? ? ?Recommend Tylenol 500 mg every 6 hours for pain ? ?For breakthrough pain 400 mg of ibuprofen every 8 hours as needed to address concern for elevated liver function test ? ?Start gabapentin 100 mg every 8 as needed for pain radiating to the  forearm ? ? ?Meds ordered this encounter  ?Medications  ? gabapentin (NEURONTIN) 100 MG capsule  ?  Sig: Take 1 capsule (100 mg total) by mouth every 8 (eight) hours as needed.  ?  Dispense:  90 capsule  ?  Refill:  2  ? ?(Coding: chronic problem with exacerbation, outside xrays read and rx management) ?

## 2021-08-23 ENCOUNTER — Telehealth: Payer: Medicare HMO

## 2021-08-30 ENCOUNTER — Encounter: Payer: Self-pay | Admitting: Pulmonary Disease

## 2021-08-30 ENCOUNTER — Ambulatory Visit: Payer: Medicare HMO | Admitting: Pulmonary Disease

## 2021-08-30 VITALS — BP 128/78 | HR 68 | Temp 97.7°F | Ht 61.5 in | Wt 137.6 lb

## 2021-08-30 DIAGNOSIS — R0683 Snoring: Secondary | ICD-10-CM

## 2021-08-30 NOTE — Patient Instructions (Signed)
Will arrange for home sleep study Will call to arrange for follow up after sleep study reviewed  

## 2021-08-30 NOTE — Progress Notes (Signed)
? ?[facility] ? ?Chief Complaint  ?Patient presents with  ? Consult  ?  Ref by Dr. Moshe Cipro for ongoing fatigue for potential OSA   ? ? ?Past Surgical History:  ?She  has a past surgical history that includes Cholecystectomy; Knee arthroscopy with lateral menisectomy (Left, 12/11/2014); Chondroplasty (Left, 12/11/2014); and Colonoscopy with propofol (N/A, 02/27/2020). ? ?Past Medical History:  ?GERD, Glaucoma, HLD, HTN, Vit D deficiency, Cervical disc disease ? ?Constitutional:  ?BP 128/78 (BP Location: Left Arm, Patient Position: Sitting)   Pulse 68   Temp 97.7 ?F (36.5 ?C) (Temporal)   Ht 5' 1.5" (1.562 m)   Wt 137 lb 9.6 oz (62.4 kg)   SpO2 98% Comment: ra  BMI 25.58 kg/m?  ? ?Brief Summary:  ?Colleen [name] is a 76 y.o. female former smoker with snoring. ?  ? ? ? ?Subjective:  ? ?She was recently seen by her PCP.  Noted to have snoring and progressive fatigue.  This has been getting worse over the past 1 year.  Her husband has told her she snores while asleep.  She gets sleep in the mid-afternoon and sometimes takes a nap.  She gets cramps in her lower legs about two nights per week.   ? ?She goes to sleep between 930 and 10 pm.  She falls asleep in a few minutes.  She wakes up 2 times to use the bathroom.  She gets out of bed between 630 and 7 am.  She feels tired in the morning.  She denies morning headache.  She does not use anything to help her fall sleep.  She drinks coffee in the morning. ? ?She denies sleep walking, sleep talking, bruxism, or nightmares.  There is no history of restless legs.  She denies sleep hallucinations, sleep paralysis, or cataplexy. ? ?The Epworth score is 2 out of 24. ? ? ?Physical Exam:  ? ?Appearance - well kempt  ? ?ENMT - no sinus tenderness, no oral exudate, no LAN, Mallampati 3 airway, no stridor ? ?Respiratory - equal breath sounds bilaterally, no wheezing or rales ? ?CV - s1s2 regular rate and rhythm, no murmurs ? ?Ext - no  clubbing, no edema ? ?Skin - no rashes ? ?Psych - normal mood and affect ?  ?Sleep Tests:  ? ? ?Social History:  ?She  reports that she has quit smoking. She has never used smokeless tobacco. She reports that she does not drink alcohol and does not use drugs. ? ?Family History:  ?Her family history includes Diabetes in her father, sister, and sister; Heart disease in her sister; Heart disease (age of onset: 36) in her mother; Hypertension in her brother, father, mother, sister, and sister; Kidney disease in her sister; Multiple sclerosis in her sister; Schizophrenia (age of onset: 53) in her brother; Stroke in her father. ?  ? ?Discussion:  ?She has snoring, sleep disruption, apnea and daytime sleepiness.  She has history of hypertension.  I am concerned she could have obstructive sleep apnea. ? ?Assessment/Plan:  ? ?Snoring with excessive daytime sleepiness. ?- will need to arrange for a home sleep study ? ?Obesity. ?- discussed how weight can impact sleep and risk for sleep disordered breathing ?- discussed options to assist with weight loss: combination of diet modification, cardiovascular and strength training exercises ? ?Cardiovascular risk. ?- had an extensive discussion regarding the adverse health consequences related to untreated sleep disordered breathing ?- specifically discussed the risks for hypertension, coronary artery disease, cardiac dysrhythmias,  cerebrovascular disease, and diabetes ?- lifestyle modification discussed ? ?Safe driving practices. ?- discussed how sleep disruption can increase risk of accidents, particularly when driving ?- safe driving practices were discussed ? ?Therapies for obstructive sleep apnea. ?- if the sleep study shows significant sleep apnea, then various therapies for treatment were reviewed: CPAP, oral appliance, and surgical interventions ? ?Time Spent Involved in Patient Care on Day of Examination:  ?36 minutes ? ?Follow up:  ? ?Patient Instructions  ?Will arrange  for home sleep study ?Will call to arrange for follow up after sleep study reviewed ? ?Medication List:  ? ?Allergies as of 08/30/2021   ? ?   Reactions  ? Aspirin Nausea Only  ? Upset stomach (higher doses)  ? Fenofibrate Other (See Comments)  ? Muscle aches  ? Statins Other (See Comments)  ? Muscle cramps  ? Flonase [fluticasone] Other (See Comments)  ? Caused increased pressure in the eye  ? ?  ? ?  ?Medication List  ?  ? ?  ? Accurate as of August 30, 2021  9:52 AM. If you have any questions, ask your nurse or doctor.  ?  ?  ? ?  ? ?acetaminophen 650 MG CR tablet ?Commonly known as: TYLENOL ?Take 650 mg by mouth every 8 (eight) hours as needed for pain. ?  ?Calcium Carb-Cholecalciferol 600-800 MG-UNIT Tabs ?Take 1 tablet by mouth daily. ?  ?CoQ10 200 MG Caps ?Take 200 mg by mouth daily. ?  ?diphenhydrAMINE 25 MG tablet ?Commonly known as: BENADRYL ?Take 25 mg by mouth daily as needed for allergies. ?  ?gabapentin 100 MG capsule ?Commonly known as: NEURONTIN ?Take 1 capsule (100 mg total) by mouth every 8 (eight) hours as needed. ?  ?ibuprofen 200 MG tablet ?Commonly known as: ADVIL ?Take 600-800 mg by mouth every 6 (six) hours as needed. ?  ?latanoprost 0.005 % ophthalmic solution ?Commonly known as: XALATAN ?Place 1 drop into both eyes at bedtime. ?  ?lisinopril-hydrochlorothiazide 20-25 MG tablet ?Commonly known as: ZESTORETIC ?TAKE 1 TABLET EVERY DAY ?  ?multivitamin with minerals Tabs tablet ?Take 1 tablet by mouth daily. ?  ?omeprazole 20 MG capsule ?Commonly known as: PRILOSEC ?Take 20mg  30 minutes before a meal once or twice daily as needed. ?  ?polyethylene glycol powder 17 GM/SCOOP powder ?Commonly known as: MiraLax ?Take one capful twice daily until soft bowel movement, then continue once daily as needed to maintain regular bowel movement. ?What changed: additional instructions ?  ?promethazine-dextromethorphan 6.25-15 MG/5ML syrup ?Commonly known as: PROMETHAZINE-DM ?Take 5 mLs by mouth 4 (four) times  daily as needed for cough. ?  ?rosuvastatin 5 MG tablet ?Commonly known as: CRESTOR ?TAKE 1 TABLET ON MONDAY, WEDNESDAY, FRIDAY AND SUNDAY IN THE MORNING ?  ?Soothe XP Soln ?Place 1 application into both eyes 3 (three) times daily. ?  ?vitamin C 1000 MG tablet ?Take 1,000 mg by mouth 2 (two) times a week. ?  ?Vitamin D3 125 MCG (5000 UT) Caps ?Take 5,000 Units by mouth daily. ?  ? ?  ? ? ?Signature:  ?Chesley Mires, MD ?[place] ?Pager - [phone] ?08/30/2021, 9:52 AM ?  ? ? ? ? ? ? ? ? ?

## 2021-09-05 DIAGNOSIS — H40053 Ocular hypertension, bilateral: Secondary | ICD-10-CM | POA: Diagnosis not present

## 2021-09-05 DIAGNOSIS — H01001 Unspecified blepharitis right upper eyelid: Secondary | ICD-10-CM | POA: Diagnosis not present

## 2021-09-05 DIAGNOSIS — H2513 Age-related nuclear cataract, bilateral: Secondary | ICD-10-CM | POA: Diagnosis not present

## 2021-09-05 DIAGNOSIS — H01002 Unspecified blepharitis right lower eyelid: Secondary | ICD-10-CM | POA: Diagnosis not present

## 2021-09-08 ENCOUNTER — Other Ambulatory Visit: Payer: Self-pay

## 2021-09-08 DIAGNOSIS — E559 Vitamin D deficiency, unspecified: Secondary | ICD-10-CM | POA: Diagnosis not present

## 2021-09-08 DIAGNOSIS — R7989 Other specified abnormal findings of blood chemistry: Secondary | ICD-10-CM

## 2021-09-08 DIAGNOSIS — I10 Essential (primary) hypertension: Secondary | ICD-10-CM | POA: Diagnosis not present

## 2021-09-09 LAB — BMP8+EGFR
BUN/Creatinine Ratio: 23 (ref 12–28)
BUN: 19 mg/dL (ref 8–27)
CO2: 24 mmol/L (ref 20–29)
Calcium: 9.7 mg/dL (ref 8.7–10.3)
Chloride: 103 mmol/L (ref 96–106)
Creatinine, Ser: 0.84 mg/dL (ref 0.57–1.00)
Glucose: 98 mg/dL (ref 70–99)
Potassium: 4.3 mmol/L (ref 3.5–5.2)
Sodium: 140 mmol/L (ref 134–144)
eGFR: 73 mL/min/{1.73_m2} (ref >59)

## 2021-09-09 LAB — VITAMIN D 25 HYDROXY (VIT D DEFICIENCY, FRACTURES): Vit D, 25-Hydroxy: 51.5 ng/mL (ref 30.0–100.0)

## 2021-09-09 LAB — CBC
Hematocrit: 37.8 % (ref 34.0–46.6)
Hemoglobin: 13 g/dL (ref 11.1–15.9)
MCH: 30.4 pg (ref 26.6–33.0)
MCHC: 34.4 g/dL (ref 31.5–35.7)
MCV: 89 fL (ref 79–97)
Platelets: 252 10*3/uL (ref 150–450)
RBC: 4.27 x10E6/uL (ref 3.77–5.28)
RDW: 12.4 % (ref 11.7–15.4)
WBC: 4.6 10*3/uL (ref 3.4–10.8)

## 2021-09-12 ENCOUNTER — Other Ambulatory Visit: Payer: Self-pay | Admitting: Family Medicine

## 2021-09-13 ENCOUNTER — Ambulatory Visit (INDEPENDENT_AMBULATORY_CARE_PROVIDER_SITE_OTHER): Payer: Medicare HMO | Admitting: Family Medicine

## 2021-09-13 ENCOUNTER — Encounter: Payer: Self-pay | Admitting: Family Medicine

## 2021-09-13 VITALS — BP 113/71 | HR 63 | Ht 62.0 in | Wt 137.1 lb

## 2021-09-13 DIAGNOSIS — M542 Cervicalgia: Secondary | ICD-10-CM | POA: Diagnosis not present

## 2021-09-13 DIAGNOSIS — E785 Hyperlipidemia, unspecified: Secondary | ICD-10-CM | POA: Diagnosis not present

## 2021-09-13 DIAGNOSIS — F322 Major depressive disorder, single episode, severe without psychotic features: Secondary | ICD-10-CM

## 2021-09-13 DIAGNOSIS — M5090 Cervical disc disorder, unspecified, unspecified cervical region: Secondary | ICD-10-CM

## 2021-09-13 DIAGNOSIS — E663 Overweight: Secondary | ICD-10-CM

## 2021-09-13 DIAGNOSIS — M25512 Pain in left shoulder: Secondary | ICD-10-CM | POA: Diagnosis not present

## 2021-09-13 DIAGNOSIS — I10 Essential (primary) hypertension: Secondary | ICD-10-CM

## 2021-09-13 DIAGNOSIS — R5383 Other fatigue: Secondary | ICD-10-CM | POA: Diagnosis not present

## 2021-09-13 MED ORDER — KETOROLAC TROMETHAMINE 60 MG/2ML IM SOLN
60.0000 mg | Freq: Once | INTRAMUSCULAR | Status: AC
  Administered 2021-09-13: 60 mg via INTRAMUSCULAR

## 2021-09-13 MED ORDER — METHYLPREDNISOLONE ACETATE 80 MG/ML IJ SUSP
80.0000 mg | Freq: Once | INTRAMUSCULAR | Status: AC
  Administered 2021-09-13: 80 mg via INTRAMUSCULAR

## 2021-09-13 MED ORDER — VENLAFAXINE HCL ER 37.5 MG PO CP24: 37.5000 mg | ORAL_CAPSULE | Freq: Every day | ORAL | 3 refills | Status: DC

## 2021-09-13 MED ORDER — MELOXICAM 7.5 MG PO TABS: 7.5000 mg | ORAL_TABLET | Freq: Every day | ORAL | 0 refills | Status: DC

## 2021-09-13 NOTE — Assessment & Plan Note (Signed)
Hyperlipidemia:Low fat diet discussed and encouraged. ? ? ?Lipid Panel  ?Lab Results  ?Component Value Date  ? CHOL 167 06/13/2021  ? HDL 51 06/13/2021  ? LDLCALC 104 (H) 06/13/2021  ? TRIG 62 06/13/2021  ? CHOLHDL 3.7 03/10/2020  ? ? needs to reduce fried and fatty foods ? ? ?

## 2021-09-13 NOTE — Assessment & Plan Note (Signed)
3 da 

## 2021-09-13 NOTE — Assessment & Plan Note (Signed)
aciute flare radiating to left shoulder ?

## 2021-09-13 NOTE — Patient Instructions (Signed)
F/U in 8 weeks, re evaluate depression ?Call if you need me before ? ?Toradol 30 mg IM and depo medrol 40 mg IM in office today for neck and left shoulder pain followed by 5 days of once daily meloxicam ? ?New for depression is once daily effexor ? ?Please work on lifestyle changes we discussed ? ?It is important that you exercise regularly at least 30 minutes 5 times a week. If you develop chest pain, have severe difficulty breathing, or feel very tired, stop exercising immediately and seek medical attention  ? ?Thanks for choosing [facility], we consider it a privelige to serve you. ? ?

## 2021-09-13 NOTE — Assessment & Plan Note (Signed)
3 day h/o unprovoked left shoulder pain  From left neck down, toradol 30mg  and depo medrol 40 mg iM and 5 day course of meloxicam ?

## 2021-09-13 NOTE — Assessment & Plan Note (Signed)
Controlled, no change in medication ?DASH diet and commitment to daily physical activity for a minimum of 30 minutes discussed and encouraged, as a part of hypertension management. ?The importance of attaining a healthy weight is also discussed. ? ? ?  09/13/2021  ?  8:43 AM 08/30/2021  ?  9:34 AM 07/11/2021  ?  8:46 AM 06/28/2021  ? 10:06 AM 06/16/2021  ?  8:59 AM 03/22/2021  ?  8:06 AM 12/24/2020  ? 10:21 AM  ?BP/Weight  ?Systolic BP 113 128 147 133 137 144 132  ?Diastolic BP 71 78 66 75 74 85 77  ?Wt. (Lbs) 137.08 137.6 136.2 138.2 137.04 139.08 138  ?BMI 25.07 kg/m2 25.58 kg/m2 24.91 kg/m2 25.28 kg/m2 25.06 kg/m2 25.85 kg/m2 25.24 kg/m2  ? ? ? ? ?

## 2021-09-13 NOTE — Assessment & Plan Note (Signed)
Currently being evaluated for sleep apnea ?

## 2021-09-13 NOTE — Assessment & Plan Note (Signed)
?  Patient re-educated about  the importance of commitment to a  minimum of 150 minutes of exercise per week as able. ? ?The importance of healthy food choices with portion control discussed, as well as eating regularly and within a 12 hour window most days. ?The need to choose "clean , green" food 50 to 75% of the time is discussed, as well as to make water the primary drink and set a goal of 64 ounces water daily. ? ?  ? ?  09/13/2021  ?  8:43 AM 08/30/2021  ?  9:34 AM 07/11/2021  ?  8:46 AM  ?Weight /BMI  ?Weight 137 lb 1.3 oz 137 lb 9.6 oz 136 lb 3.2 oz  ?Height 5\' 2"  (1.575 m) 5' 1.5" (1.562 m) 5\' 2"  (1.575 m)  ?BMI 25.07 kg/m2 25.58 kg/m2 24.91 kg/m2  ? ? ?\ ?

## 2021-09-13 NOTE — Progress Notes (Signed)
? ?Colleen [name]     MRN: [id number]      DOB: 03/21/1946 ? ? ?HPI ?Colleen [name] is here for follow up and re-evaluation of chronic medical conditions, medication management and review of any available recent lab and radiology data.  ?Preventive health is updated, specifically  Cancer screening and Immunization.   ?Questions or concerns regarding consultations or procedures which the PT has had in the interim are  addressed. ?The PT denies any adverse reactions to current medications since the last visit.  ?3 day h/o unprovoked left neck and shoulder pain ?C/o no energy and no interest or pleasure in most thing, positive depression screen , not suicidal ofr homicidal, wants medication ? ?ROS ?Denies recent fever or chills. ?Denies sinus pressure, nasal congestion, ear pain or sore throat. ?Denies chest congestion, productive cough or wheezing. ?Denies chest pains, palpitations and leg swelling ?Denies abdominal pain, nausea, vomiting,diarrhea or constipation.   ?Denies dysuria, frequency, hesitancy or incontinence. ?. ?Denies headaches, seizures, numbness, or tingling. ? ?Denies skin break down or rash. ? ? ?PE ? ?BP 113/71   Pulse 63   Ht 5\' 2"  (1.575 m)   Wt 137 lb 1.3 oz (62.2 kg)   SpO2 95%   BMI 25.07 kg/m?  ? ?Patient alert and oriented and in no cardiopulmonary distress. ? ?HEENT: No facial asymmetry, EOMI,     Neck decreased ROM ? ?Chest: Clear to auscultation bilaterally. ? ?CVS: S1, S2 no murmurs, no S3.Regular rate. ? ?ABD: Soft non tender.  ? ?Ext: No edema ? ?MS: Adequate ROM spine, , hips and knees.REduced in left shouldr ? ?Skin: Intact, no ulcerations or rash noted. ? ?Psych: Good eye contact, normal affect. Memory intact not anxious or depressed appearing. ? ?CNS: CN 2-12 intact, power,  normal throughout.no focal deficits noted. ? ? ?Assessment & Plan ? ?Essential hypertension ?Controlled, no change in medication ?DASH diet and commitment to daily physical activity for a minimum of 30 minutes  discussed and encouraged, as a part of hypertension management. ?The importance of attaining a healthy weight is also discussed. ? ? ?  09/13/2021  ?  8:43 AM 08/30/2021  ?  9:34 AM 07/11/2021  ?  8:46 AM 06/28/2021  ? 10:06 AM 06/16/2021  ?  8:59 AM 03/22/2021  ?  8:06 AM 12/24/2020  ? 10:21 AM  ?BP/Weight  ?Systolic BP 113 128 147 133 137 144 132  ?Diastolic BP 71 78 66 75 74 85 77  ?Wt. (Lbs) 137.08 137.6 136.2 138.2 137.04 139.08 138  ?BMI 25.07 kg/m2 25.58 kg/m2 24.91 kg/m2 25.28 kg/m2 25.06 kg/m2 25.85 kg/m2 25.24 kg/m2  ? ? ? ? ? ?Right shoulder pain ?3 da ? ?Left shoulder pain ?3 day h/o unprovoked left shoulder pain  From left neck down, toradol 30mg  and depo medrol 40 mg iM and 5 day course of meloxicam ? ?Cervical disc disease ?aciute flare radiating to left shoulder ? ?Fatigue ?Currently being evaluated for sleep apnea ? ?Overweight (BMI 25.0-29.9) ? ?Patient re-educated about  the importance of commitment to a  minimum of 150 minutes of exercise per week as able. ? ?The importance of healthy food choices with portion control discussed, as well as eating regularly and within a 12 hour window most days. ?The need to choose "clean , green" food 50 to 75% of the time is discussed, as well as to make water the primary drink and set a goal of 64 ounces water daily. ? ?  ? ?  09/13/2021  ?  8:43 AM 08/30/2021  ?  9:34 AM 07/11/2021  ?  8:46 AM  ?Weight /BMI  ?Weight 137 lb 1.3 oz 137 lb 9.6 oz 136 lb 3.2 oz  ?Height 5\' 2"  (1.575 m) 5' 1.5" (1.562 m) 5\' 2"  (1.575 m)  ?BMI 25.07 kg/m2 25.58 kg/m2 24.91 kg/m2  ? ? ?\ ? ?Hyperlipidemia LDL goal <100 ?Hyperlipidemia:Low fat diet discussed and encouraged. ? ? ?Lipid Panel  ?Lab Results  ?Component Value Date  ? CHOL 167 06/13/2021  ? HDL 51 06/13/2021  ? LDLCALC 104 (H) 06/13/2021  ? TRIG 62 06/13/2021  ? CHOLHDL 3.7 03/10/2020  ? ? needs to reduce fried and fatty foods ? ? ? ? ?

## 2021-09-15 ENCOUNTER — Ambulatory Visit (INDEPENDENT_AMBULATORY_CARE_PROVIDER_SITE_OTHER): Payer: Medicare HMO | Admitting: *Deleted

## 2021-09-15 DIAGNOSIS — E785 Hyperlipidemia, unspecified: Secondary | ICD-10-CM

## 2021-09-15 DIAGNOSIS — I10 Essential (primary) hypertension: Secondary | ICD-10-CM

## 2021-09-15 NOTE — Chronic Care Management (AMB) (Signed)
?Chronic Care Management  ? ?CCM RN Visit Note ? ?09/15/2021 ?Name: Colleen [name] MRN: [id number] DOB: March 01, 1946 ? ?Subjective: ?Colleen [name] is a 76 y.o. year old female who is a primary care patient of Fayrene Helper, MD. The care management team was consulted for assistance with disease management and care coordination needs.   ? ?Engaged with patient by telephone for follow up visit in response to provider referral for case management and/or care coordination services.  ? ?Consent to Services:  ?The patient was given information about Chronic Care Management services, agreed to services, and gave verbal consent prior to initiation of services.  Please see initial visit note for detailed documentation.  ? ?Patient agreed to services and verbal consent obtained.  ? ?Assessment: Review of patient past medical history, allergies, medications, health status, including review of consultants reports, laboratory and other test data, was performed as part of comprehensive evaluation and provision of chronic care management services.  ? ?SDOH (Social Determinants of Health) assessments and interventions performed:   ? ?CCM Care Plan ? ?Allergies  ?Allergen Reactions  ? Aspirin Nausea Only  ?  Upset stomach (higher doses)  ? Fenofibrate Other (See Comments)  ?  Muscle aches  ? Statins Other (See Comments)  ?  Muscle cramps  ? Flonase [Fluticasone] Other (See Comments)  ?  Caused increased pressure in the eye  ? ? ?Outpatient Encounter Medications as of 09/15/2021  ?Medication Sig  ? acetaminophen (TYLENOL) 650 MG CR tablet Take 650 mg by mouth every 8 (eight) hours as needed for pain.   ? Artificial Tear Solution (SOOTHE XP) SOLN Place 1 application into both eyes 3 (three) times daily.   ? Ascorbic Acid (VITAMIN C) 1000 MG tablet Take 1,000 mg by mouth 2 (two) times a week.   ? Calcium Carb-Cholecalciferol 600-800 MG-UNIT TABS Take 1 tablet by mouth daily.   ? Cholecalciferol (VITAMIN D3) 125 MCG (5000 UT) CAPS Take  5,000 Units by mouth daily.   ? Coenzyme Q10 (COQ10) 200 MG CAPS Take 200 mg by mouth daily.  ? diphenhydrAMINE (BENADRYL) 25 MG tablet Take 25 mg by mouth daily as needed for allergies.   ? gabapentin (NEURONTIN) 100 MG capsule Take 1 capsule (100 mg total) by mouth every 8 (eight) hours as needed.  ? ibuprofen (ADVIL) 200 MG tablet Take 600-800 mg by mouth every 6 (six) hours as needed.  ? latanoprost (XALATAN) 0.005 % ophthalmic solution Place 1 drop into both eyes at bedtime.  ? lisinopril-hydrochlorothiazide (ZESTORETIC) 20-25 MG tablet TAKE 1 TABLET EVERY DAY  ? meloxicam (MOBIC) 7.5 MG tablet Take 1 tablet (7.5 mg total) by mouth daily.  ? Multiple Vitamin (MULTIVITAMIN WITH MINERALS) TABS tablet Take 1 tablet by mouth daily.  ? omeprazole (PRILOSEC) 20 MG capsule Take 87m 30 minutes before a meal once or twice daily as needed.  ? polyethylene glycol powder (MIRALAX) 17 GM/SCOOP powder Take one capful twice daily until soft bowel movement, then continue once daily as needed to maintain regular bowel movement. (Patient taking differently: Twice a week)  ? promethazine-dextromethorphan (PROMETHAZINE-DM) 6.25-15 MG/5ML syrup Take 5 mLs by mouth 4 (four) times daily as needed for cough.  ? rosuvastatin (CRESTOR) 5 MG tablet TAKE 1 TABLET ON MONDAY, WEDNESDAY, FRIDAY AND SUNDAY IN THE MORNING  ? venlafaxine XR (EFFEXOR XR) 37.5 MG 24 hr capsule Take 1 capsule (37.5 mg total) by mouth daily with breakfast.  ? ?No facility-administered encounter medications on file as of 09/15/2021.  ? ? ?  Patient Active Problem List  ? Diagnosis Date Noted  ? Left shoulder pain 09/13/2021  ? Depression, major, single episode, severe ([place]) 09/13/2021  ? Arthritis, shoulder region right 07/11/2021  ? Cervical disc disease 07/11/2021  ? Snoring 06/16/2021  ? Fatigue 06/16/2021  ? Chronic cough 06/16/2021  ? Constipation 08/23/2020  ? Overweight (BMI 25.0-29.9) 11/20/2018  ? Abnormal finding on EKG 03/16/2018  ? Neck pain on right side  03/11/2018  ? Hip pain, right 03/11/2018  ? Back pain with radiation 03/11/2018  ? Primary osteoarthritis of left knee   ? Left knee pain 11/15/2014  ? IFG (impaired fasting glucose) 10/13/2013  ? ALLERGIC RHINITIS, SEASONAL 03/30/2008  ? Hyperlipidemia LDL goal <100 09/17/2007  ? Essential hypertension 09/17/2007  ? GERD 09/17/2007  ? Osteopenia 09/17/2007  ? ? ?Conditions to be addressed/monitored:HTN and HLD ? ?Care Plan : RN Care Manager plan of care  ?Updates made by Kassie Mends, RN since 09/15/2021 12:00 AM  ?  ? ?Problem: No plan of care established for management of chronic disease states (HTN, HLD)   ?Priority: High  ?  ? ?Long-Range Goal: Development of plan of care for chronic disease management (HTN, HLD)   ?Start Date: 03/29/2021  ?Expected End Date: 03/14/2022  ?Priority: High  ?Note:   ?Current Barriers:  ?Knowledge Deficits related to plan of care for management of HTN and HLD  ?Patient reports she lives with her spouse, has adult children she can call on if needed, is independent in all aspects of her care, continues to drive, does light exercises sometimes at home, usually eats a regular diet, trying to drink more water.  Reports does not have advance directives and did receive documents in the mail but has not completed. ?Patient reports she continues checking blood pressure several times per week and states "readings are good" ?ASCVD risk enhancing conditions: age >57, HTN ?Patient reports she has had chronic issues with constipation and is seeing gastroenterologist, taking medications and states "this has helped" ?Patient reports she saw primary care provider for episode of depression and is taking her medication as prescribed, getting outdoors to walk in the sunshine and work in her flowers, and reports this helps, pt states "too early to tell how well this medicine will help", saw doctor on 09/13/21 for left shoulder pain and now taking meloxicam and "this is helping so far" ? ?RNCM Clinical  Goal(s):  ?Patient will verbalize understanding of plan for management of HTN and HLD as evidenced by patient report, review EHR and  through collaboration with RN Care manager, provider, and care team.  ? ?Interventions: ?1:1 collaboration with primary care provider regarding development and update of comprehensive plan of care as evidenced by provider attestation and co-signature ?Inter-disciplinary care team collaboration (see longitudinal plan of care) ?Evaluation of current treatment plan related to  self management and patient's adherence to plan as established by provider ? ?Hypertension Interventions: ?Last practice recorded BP readings:  ?BP Readings from Last 3 Encounters:  ?03/22/21 (!) 144/85  ?12/24/20 132/77  ?11/16/20 129/72  ?Most recent eGFR/CrCl:  ?Lab Results  ?Component Value Date  ? EGFR 64 11/09/2020  ?  No components found for: CRCL ? ?Evaluation of current treatment plan related to hypertension self management and patient's adherence to plan as established by provider ?Reviewed medications with patient and discussed importance of compliance ?Counseled on the importance of exercise goals with target of 150 minutes per week ?Encouraged patient to continue getting outdoors and working in her  flowers ?Reviewed upcoming scheduled appointments ?Reinforced with patient to continue checking blood pressure and keeping a log ?Reinforced low sodium diet and importance of reading labels ?Reviewed most recent depression screening with patient, importance of self care, relaxation and keeping stress to a minimum ? ? ?Hyperlipidemia:  (Status: Goal on Track (progressing): YES.) Long Term Goal  ?Lab Results  ?Component Value Date  ? CHOL 158 11/09/2020  ? HDL 57 11/09/2020  ? Golden 90 11/09/2020  ? TRIG 50 11/09/2020  ? CHOLHDL 3.7 03/10/2020  ?  ? ?Medication review performed; medication list updated in electronic medical record.  ?Reviewed role and benefits of statin for ASCVD risk reduction; ?Reviewed  importance of limiting foods high in cholesterol; ?Reviewed exercise goals and target of 150 minutes per week; ?Reviewed heart healthy diet and food choices ?Reviewed pain management strategies ? ?Patient

## 2021-09-15 NOTE — Patient Instructions (Signed)
Visit Information ? ?Thank you for taking time to visit with me today. Please don't hesitate to contact me if I can be of assistance to you before our next scheduled telephone appointment. ? ?Following are the goals we discussed today:  ?Take medications as prescribed   ?Attend all scheduled provider appointments ?Call pharmacy for medication refills 3-7 days in advance of running out of medications ?Perform IADL's (shopping, preparing meals, housekeeping, managing finances) independently ?Call provider office for new concerns or questions  ?check blood pressure 3 times per week ?write blood pressure results in a log or diary ?learn about high blood pressure ?keep a blood pressure log ?take blood pressure log to all doctor appointments ?keep all doctor appointments ?take medications for blood pressure exactly as prescribed ?eat more whole grains, fruits and vegetables, lean meats and healthy fats ?call for medicine refill 2 or 3 days before it runs out ?call doctor with any symptoms you believe are related to your medicine ?call doctor when you experience any new symptoms ?adhere to prescribed diet: heart healthy, avoid trans/ saturated fats ?Continue checking blood pressure and keeping a log- keep up the good work ?Continue getting outdoors in the sunshine and walking, working in your flowers ?Practice relaxation, keep stress to a minimum ?Call your doctor if you feel depression is worsening or medication not helping ? ?Our next appointment is by telephone on 12/01/21 at 9 am ? ?Please call the care guide team at [phone] if you need to cancel or reschedule your appointment.  ? ?If you are experiencing a Mental Health or [place] or need someone to talk to, please call the Suicide and Crisis Lifeline: 988 ?call the [place] National Suicide Prevention Lifeline: 901-833-5353 or TTY: 316-026-4174 TTY (810) 719-3136) to talk to a trained counselor ?call 1-800-273-TALK (toll free, 24 hour hotline) ?go  to [facility] [address] [place] [phone]) ?call the [facility]: [phone] ?call 911  ? ?Patient verbalizes understanding of instructions and care plan provided today and agrees to view in [place]. Active MyChart status confirmed with patient.   ? ?Jacqlyn Larsen RNC, BSN ?RN Case Manager ?[place] ?[phone] ?Managing Depression, Adult ?Depression is a mental health condition that affects your thoughts, feelings, and actions. Being diagnosed with depression can bring you relief if you did not know why you have felt or behaved a certain way. It could also leave you feeling overwhelmed with uncertainty about your future. Preparing yourself to manage your symptoms can help you feel more positive about your future. ?How to manage lifestyle changes ?Managing stress ? ?Stress is your body's reaction to life changes and events, both good and bad. Stress can add to your feelings of depression. Learning to manage your stress can help lessen your feelings of depression. ?Try some of the following approaches to reducing your stress (stress reduction techniques): ?Listen to music that you enjoy and that inspires you. ?Try using a meditation app or take a meditation class. ?Develop a practice that helps you connect with your spiritual self. Walk in nature, pray, or go to a place of worship. ?Do some deep breathing. To do this, inhale slowly through your nose. Pause at the top of your inhale for a few seconds and then exhale slowly, letting your muscles relax. ?Practice yoga to help relax and work your muscles. ?Choose a stress reduction technique that suits your lifestyle and personality. These techniques take time and practice to develop. Set aside 5-15 minutes a  day to do them. Therapists can offer training in these techniques. Other things you can do to manage stress include: ?Keeping a stress diary. ?Knowing your limits and saying no  when you think something is too much. ?Paying attention to how you react to certain situations. You may not be able to control everything, but you can change your reaction. ?Adding humor to your life by watching funny films or TV shows. ?Making time for activities that you enjoy and that relax you. ? ?Medicines ?Medicines, such as antidepressants, are often a part of treatment for depression. ?Talk with your pharmacist or health care provider about all the medicines, supplements, and herbal products that you take, their possible side effects, and what medicines and other products are safe to take together. ?Make sure to report any side effects you may have to your health care provider. ?Relationships ?Your health care provider may suggest family therapy, couples therapy, or individual therapy as part of your treatment. ?How to recognize changes ?Everyone responds differently to treatment for depression. As you recover from depression, you may start to: ?Have more interest in doing activities. ?Feel less hopeless. ?Have more energy. ?Overeat less often, or have a better appetite. ?Have better mental focus. ?It is important to recognize if your depression is not getting better or is getting worse. The symptoms you had in the beginning may return, such as: ?Tiredness (fatigue) or low energy. ?Eating too much or too little. ?Sleeping too much or too little. ?Feeling restless, agitated, or hopeless. ?Trouble focusing or making decisions. ?Unexplained physical complaints. ?Feeling irritable, angry, or aggressive. ?If you or your family members notice these symptoms coming back, let your health care provider know right away. ?Follow these instructions at home: ?Activity ? ?Try to get some form of exercise each day, such as walking, biking, swimming, or lifting weights. ?Practice stress reduction techniques. ?Engage your mind by taking a class or doing some volunteer work. ?Lifestyle ?Get the right amount and quality of  sleep. ?Cut down on using caffeine, tobacco, alcohol, and other potentially harmful substances. ?Eat a healthy diet that includes plenty of vegetables, fruits, whole grains, low-fat dairy products, and lean protein. Do not eat a lot of foods that are high in solid fats, added sugars, or salt (sodium). ?General instructions ?Take over-the-counter and prescription medicines only as told by your health care provider. ?Keep all follow-up visits as told by your health care provider. This is important. ?Where to find support ?Talking to others ? ?Friends and family members can be sources of support and guidance. Talk to trusted friends or family members about your condition. Explain your symptoms to them, and let them know that you are working with a health care provider to treat your depression. Tell friends and family members how they also can be helpful. ?Finances ?Find appropriate mental health providers that fit with your financial situation. ?Talk with your health care provider about options to get reduced prices on your medicines. ?Where to find more information ?You can find support in your area from: ?Anxiety and Depression Association of America (ADAA): https://www.clark.net/ ?Mental Health America: www.mentalhealthamerica.net ?National Alliance on Mental Illness: www.nami.org ?Contact a health care provider if: ?You stop taking your antidepressant medicines, and you have any of these symptoms: ?Nausea. ?Headache. ?Light-headedness. ?Chills and body aches. ?Not being able to sleep (insomnia). ?You or your friends and family think your depression is getting worse. ?Get help right away if: ?You have thoughts of hurting yourself or others. ?If you ever feel like  you may hurt yourself or others, or have thoughts about taking your own life, get help right away. Go to your nearest emergency department or: ?Call your local emergency services (911 in the [place] ?Call a suicide crisis helpline, such as the [place] at 5104074831 or 988 in the [place]. This is open 24 hours a day in the [place] ?Text the Crisis Text Line at 847-569-6721 (in the [place].). ?Summary ?If you are diagnosed with depression, preparing yoursel

## 2021-09-20 ENCOUNTER — Telehealth: Payer: Self-pay | Admitting: Pulmonary Disease

## 2021-09-20 NOTE — Telephone Encounter (Signed)
I will call patient to schedule appt ?

## 2021-10-05 DIAGNOSIS — I10 Essential (primary) hypertension: Secondary | ICD-10-CM | POA: Diagnosis not present

## 2021-10-05 DIAGNOSIS — Z87891 Personal history of nicotine dependence: Secondary | ICD-10-CM | POA: Diagnosis not present

## 2021-10-05 DIAGNOSIS — E785 Hyperlipidemia, unspecified: Secondary | ICD-10-CM

## 2021-10-06 ENCOUNTER — Ambulatory Visit (INDEPENDENT_AMBULATORY_CARE_PROVIDER_SITE_OTHER): Payer: Medicare HMO

## 2021-10-06 DIAGNOSIS — R0683 Snoring: Secondary | ICD-10-CM

## 2021-10-11 DIAGNOSIS — R7989 Other specified abnormal findings of blood chemistry: Secondary | ICD-10-CM | POA: Diagnosis not present

## 2021-10-12 LAB — HEPATIC FUNCTION PANEL
AG Ratio: 1.6 (calc) (ref 1.0–2.5)
ALT: 17 U/L (ref 6–29)
AST: 20 U/L (ref 10–35)
Albumin: 4.3 g/dL (ref 3.6–5.1)
Alkaline phosphatase (APISO): 90 U/L (ref 37–153)
Bilirubin, Direct: 0.1 mg/dL (ref 0.0–0.2)
Globulin: 2.7 g/dL (calc) (ref 1.9–3.7)
Indirect Bilirubin: 0.3 mg/dL (calc) (ref 0.2–1.2)
Total Bilirubin: 0.4 mg/dL (ref 0.2–1.2)
Total Protein: 7 g/dL (ref 6.1–8.1)

## 2021-10-18 ENCOUNTER — Telehealth: Payer: Self-pay | Admitting: Pulmonary Disease

## 2021-10-18 DIAGNOSIS — R0683 Snoring: Secondary | ICD-10-CM

## 2021-10-20 NOTE — Telephone Encounter (Signed)
Okay to schedule split night sleep study.  Please let her know we will to get approval from insurance before this can be scheduled.

## 2021-10-20 NOTE — Telephone Encounter (Signed)
Order has been placed and [facility] notified.

## 2021-10-20 NOTE — Telephone Encounter (Signed)
pt tried to do a HST but only got 30 mins I called her to resc she wants to do a in lab study can we order this     Please advise

## 2021-10-23 NOTE — Progress Notes (Signed)
Cardiology Office Note:    Date:  10/24/2021   ID:  Colleen [name], Colleen [name] Dec 20, 1945, MRN [id number]  PCP:  Kerri Perches, MD   [facility] Providers Cardiologist:  Nona Dell, MD     Referring MD: Kerri Perches, MD   CC: Fatigue and weakness Consulted for the evaluation of re-establish care at the behest of Dr. Lodema Hong  History of Present Illness:    Colleen [name] is a 76 y.o. female with a hx of HTN, HLD, normal stress test 2020 (saw Dr. Diona Browner) who presents for evaluation.  Patient notes that she is feeling tired.   Was last feeling well back before her COVID-19 shots. Able to walk and do exercise; back in 2020 could do weights.  Was able to dance. Now she can play outside with her grand-daugther but felt tired.  Has had no chest pain, chest pressure, chest tightness, chest stinging.   Patient exertion notable for running with  and feels no symptoms.    No shortness of breath, DOE.  No PND or orthopnea.  No weight gain, leg swelling , but slight abdominal swelling.  No syncope or near syncope. Notes  no palpitations or funny heart beats.     Patient reports prior cardiac testing including 2020 stress test. TSH normal 2023.   Past Medical History:  Diagnosis Date   GERD (gastroesophageal reflux disease)    Glaucoma    Hyperlipidemia    Hypertension     Past Surgical History:  Procedure Laterality Date   CHOLECYSTECTOMY     CHONDROPLASTY Left 12/11/2014   Procedure: CHONDROPLASTY LEFT MEDIAL FEMORAL CONDYLE;  Surgeon: Vickki Hearing, MD;  Location: [facility];  Service: Orthopedics;  Laterality: Left;   COLONOSCOPY WITH PROPOFOL N/A 02/27/2020   [place]: Fair prep, diverticulosis, nonbleeding internal hemorrhoids.  Consider Next colonoscopy 10 years   KNEE ARTHROSCOPY WITH LATERAL MENISECTOMY Left 12/11/2014   Procedure: KNEE ARTHROSCOPY WITH LATERAL AND MEDIAL MENISECTOMY;  Surgeon: Vickki Hearing, MD;  Location: [facility];  Service: Orthopedics;   Laterality: Left;    Current Medications: Current Meds  Medication Sig   acetaminophen (TYLENOL) 650 MG CR tablet Take 650 mg by mouth every 8 (eight) hours as needed for pain.    Artificial Tear Solution (SOOTHE XP) SOLN Place 1 application into both eyes 3 (three) times daily.    Ascorbic Acid (VITAMIN C) 1000 MG tablet Take 1,000 mg by mouth 2 (two) times a week.    Cholecalciferol (VITAMIN D3) 125 MCG (5000 UT) CAPS Take 5,000 Units by mouth daily.    Coenzyme Q10 (COQ10) 200 MG CAPS Take 200 mg by mouth daily.   diphenhydrAMINE (BENADRYL) 25 MG tablet Take 25 mg by mouth daily as needed for allergies.    ibuprofen (ADVIL) 200 MG tablet Take 600-800 mg by mouth every 6 (six) hours as needed.   latanoprost (XALATAN) 0.005 % ophthalmic solution Place 1 drop into both eyes at bedtime.   lisinopril-hydrochlorothiazide (ZESTORETIC) 20-25 MG tablet TAKE 1 TABLET EVERY DAY   meloxicam (MOBIC) 7.5 MG tablet Take 1 tablet (7.5 mg total) by mouth daily.   Multiple Vitamin (MULTIVITAMIN WITH MINERALS) TABS tablet Take 1 tablet by mouth daily.   omeprazole (PRILOSEC) 20 MG capsule Take 20mg  30 minutes before a meal once or twice daily as needed.   polyethylene glycol powder (MIRALAX) 17 GM/SCOOP powder Take one capful twice daily until soft bowel movement, then continue once daily as needed to maintain regular bowel movement. (  Patient taking differently: Twice a week)   promethazine-dextromethorphan (PROMETHAZINE-DM) 6.25-15 MG/5ML syrup Take 5 mLs by mouth 4 (four) times daily as needed for cough.   rosuvastatin (CRESTOR) 5 MG tablet TAKE 1 TABLET ON MONDAY, WEDNESDAY, FRIDAY AND SUNDAY IN THE MORNING   venlafaxine XR (EFFEXOR XR) 37.5 MG 24 hr capsule Take 1 capsule (37.5 mg total) by mouth daily with breakfast.     Allergies:   Aspirin, Fenofibrate, Statins, and Flonase [fluticasone]   Social History   Socioeconomic History   Marital status: Married    Spouse name: Not on file   Number of  children: Not on file   Years of education: Not on file   Highest education level: 12th grade  Occupational History   Not on file  Tobacco Use   Smoking status: Former   Smokeless tobacco: Never  Vaping Use   Vaping Use: Never used  Substance and Sexual Activity   Alcohol use: No   Drug use: No   Sexual activity: Never  Other Topics Concern   Not on file  Social History Narrative   Not on file   Social Determinants of Health   Financial Resource Strain: Low Risk  (04/09/2020)   Overall Financial Resource Strain (CARDIA)    Difficulty of Paying Living Expenses: Not hard at all  Food Insecurity: No Food Insecurity (04/12/2021)   Hunger Vital Sign    Worried About Running Out of Food in the Last Year: Never true    Ran Out of Food in the Last Year: Never true  Transportation Needs: No Transportation Needs (04/12/2021)   PRAPARE - Administrator, Civil Service (Medical): No    Lack of Transportation (Non-Medical): No  Physical Activity: Insufficiently Active (04/12/2021)   Exercise Vital Sign    Days of Exercise per Week: 3 days    Minutes of Exercise per Session: 30 min  Stress: No Stress Concern Present (04/09/2020)   [employer] of Occupational Health - Occupational Stress Questionnaire    Feeling of Stress : Not at all  Social Connections: Moderately Isolated (04/12/2021)   Social Connection and Isolation Panel [NHANES]    Frequency of Communication with Friends and Family: Three times a week    Frequency of Social Gatherings with Friends and Family: Three times a week    Attends Religious Services: Never    Active Member of Clubs or Organizations: No    Attends Banker Meetings: Never    Marital Status: Married   Socia: has a Company secretary  Family History: The patient's family history includes Diabetes in her father, sister, and sister; Heart disease in her sister; Heart disease (age of onset: 33) in her mother; Hypertension in her  brother, father, mother, sister, and sister; Kidney disease in her sister; Multiple sclerosis in her sister; Schizophrenia (age of onset: 48) in her brother; Stroke in her father.  ROS:   Please see the history of present illness.    All other systems reviewed and are negative.  EKGs/Labs/Other Studies Reviewed:    The following studies were reviewed today:  EKG:  EKG is  ordered today.  The ekg ordered today demonstrates  10/24/21: SR Global TWI rates 68  Recent Labs: 06/13/2021: TSH 3.360 09/08/2021: BUN 19; Creatinine, Ser 0.84; Hemoglobin 13.0; Platelets 252; Potassium 4.3; Sodium 140 10/11/2021: ALT 17  Recent Lipid Panel    Component Value Date/Time   CHOL 167 06/13/2021 0926   TRIG 62 06/13/2021 0926   HDL  51 06/13/2021 0926   CHOLHDL 3.7 03/10/2020 0943   VLDL 14 07/10/2016 0910   LDLCALC 104 (H) 06/13/2021 0926   LDLCALC 122 (H) 03/10/2020 0943        Physical Exam:    VS:  BP 122/64   Pulse 68   Ht 5\' 1"  (1.549 m)   Wt 138 lb 3.2 oz (62.7 kg)   SpO2 97%   BMI 26.11 kg/m     Wt Readings from Last 3 Encounters:  10/24/21 138 lb 3.2 oz (62.7 kg)  09/13/21 137 lb 1.3 oz (62.2 kg)  08/30/21 137 lb 9.6 oz (62.4 kg)    Gen: No distress Neck: No JVD Cardiac: No Rubs or Gallops, crescendo systolic murmur, RRR +2 radial pulses Respiratory: Clear to auscultation bilaterally, normal effort, normal  respiratory rate GI: Soft, nontender, non-distended  MS: No  edema; moves all extremities Integument: Skin feels warm Neuro:  At time of evaluation, alert and oriented to person/place/time/situation  Psych: Normal affect, patient feels tired   ASSESSMENT:    1. Fatigue, unspecified type   2. DOE (dyspnea on exertion)   3. Essential hypertension   4. Hyperlipidemia, unspecified hyperlipidemia type   5. Myalgia due to statin   6. Heart murmur, systolic    PLAN:     New Exertional fatigue New systolic heart murmur HTN HLD  GI upset with ASA Statin related  myalgias (tolerates rosuvastaitn 5 MWF) - New exertional fatigue may be related to deconditioning; given her family history of CAD and her risk factors, will do exercise NM Stress test   - new systolic heart murmur; will get echo - no medication changes - if normal studies will see in one year   Medication Adjustments/Labs and Tests Ordered: Current medicines are reviewed at length with the patient today.  Concerns regarding medicines are outlined above.  Orders Placed This Encounter  Procedures   NM Myocar Multi W/Spect W/Wall Motion / EF   EKG 12-Lead   ECHOCARDIOGRAM COMPLETE   No orders of the defined types were placed in this encounter.   Patient Instructions  Medication Instructions:  Your physician recommends that you continue on your current medications as directed. Please refer to the Current Medication list given to you today.   Labwork: None  Testing/Procedures: Your physician has requested that you have an echocardiogram. Echocardiography is a painless test that uses sound waves to create images of your heart. It provides your doctor with information about the size and shape of your heart and how well your heart's chambers and valves are working. This procedure takes approximately one hour. There are no restrictions for this procedure.  Your physician has requested that you have en exercise stress myoview. For further information please visit [email or web address] Please follow instruction sheet, as given.   Follow-Up: Follow up with Dr. Izora Ribas in 1 year.   Any Other Special Instructions Will Be Listed Below (If Applicable).     If you need a refill on your cardiac medications before your next appointment, please call your pharmacy.    Signed, Christell Constant, MD  10/24/2021 9:23 AM    [facility]

## 2021-10-24 ENCOUNTER — Ambulatory Visit: Payer: Medicare HMO | Admitting: Internal Medicine

## 2021-10-24 ENCOUNTER — Encounter: Payer: Self-pay | Admitting: Internal Medicine

## 2021-10-24 VITALS — BP 122/64 | HR 68 | Ht 61.0 in | Wt 138.2 lb

## 2021-10-24 DIAGNOSIS — R0609 Other forms of dyspnea: Secondary | ICD-10-CM | POA: Diagnosis not present

## 2021-10-24 DIAGNOSIS — M791 Myalgia, unspecified site: Secondary | ICD-10-CM

## 2021-10-24 DIAGNOSIS — T466X5A Adverse effect of antihyperlipidemic and antiarteriosclerotic drugs, initial encounter: Secondary | ICD-10-CM | POA: Diagnosis not present

## 2021-10-24 DIAGNOSIS — R5383 Other fatigue: Secondary | ICD-10-CM

## 2021-10-24 DIAGNOSIS — R011 Cardiac murmur, unspecified: Secondary | ICD-10-CM | POA: Diagnosis not present

## 2021-10-24 DIAGNOSIS — E785 Hyperlipidemia, unspecified: Secondary | ICD-10-CM

## 2021-10-24 DIAGNOSIS — I10 Essential (primary) hypertension: Secondary | ICD-10-CM | POA: Diagnosis not present

## 2021-10-24 NOTE — Patient Instructions (Signed)
Medication Instructions:  Your physician recommends that you continue on your current medications as directed. Please refer to the Current Medication list given to you today.   Labwork: None  Testing/Procedures: Your physician has requested that you have an echocardiogram. Echocardiography is a painless test that uses sound waves to create images of your heart. It provides your doctor with information about the size and shape of your heart and how well your heart's chambers and valves are working. This procedure takes approximately one hour. There are no restrictions for this procedure.  Your physician has requested that you have en exercise stress myoview. For further information please visit [email or web address] Please follow instruction sheet, as given.   Follow-Up: Follow up with Dr. Izora Ribas in 1 year.   Any Other Special Instructions Will Be Listed Below (If Applicable).     If you need a refill on your cardiac medications before your next appointment, please call your pharmacy.

## 2021-11-01 ENCOUNTER — Ambulatory Visit (HOSPITAL_COMMUNITY)
Admission: RE | Admit: 2021-11-01 | Discharge: 2021-11-01 | Disposition: A | Discharge status: Home / Self Care | Payer: Medicare HMO | Source: Ambulatory Visit | Attending: Internal Medicine | Admitting: Internal Medicine

## 2021-11-01 ENCOUNTER — Encounter (HOSPITAL_COMMUNITY)
Admission: RE | Admit: 2021-11-01 | Discharge: 2021-11-01 | Disposition: A | Discharge status: Home / Self Care | Payer: Medicare HMO | Source: Ambulatory Visit | Attending: Internal Medicine | Admitting: Internal Medicine

## 2021-11-01 ENCOUNTER — Encounter (HOSPITAL_COMMUNITY): Payer: Self-pay

## 2021-11-01 DIAGNOSIS — R0609 Other forms of dyspnea: Secondary | ICD-10-CM | POA: Diagnosis not present

## 2021-11-01 LAB — NM MYOCAR MULTI W/SPECT W/WALL MOTION / EF
Angina Index: 1
Duke Treadmill Score: -5
Estimated workload: 5.6
Exercise duration (min): 4 min
Exercise duration (sec): 4 s
LV dias vol: 46 mL (ref 46–106)
LV sys vol: 10 mL
MPHR: 146 {beats}/min
Nuc Stress EF: 79 %
Peak HR: 122 {beats}/min
Percent HR: 83 %
RATE: 0.4
RPE: 13
Rest HR: 64 {beats}/min
Rest Nuclear Isotope Dose: 11 mCi
SDS: 1
SRS: 0
SSS: 1
ST Elevation (mm): 1 mm
Stress Nuclear Isotope Dose: 30 mCi
TID: 1.34

## 2021-11-01 LAB — ECHOCARDIOGRAM COMPLETE
AR max vel: 2.51 cm2
AV Area VTI: 2.41 cm2
AV Area mean vel: 2.43 cm2
AV Mean grad: 3 mmHg
AV Peak grad: 6.8 mmHg
Ao pk vel: 1.3 m/s
Area-P 1/2: 3.91 cm2
Calc EF: 62.5 %
MV VTI: 2.11 cm2
S' Lateral: 2.6 cm
Single Plane A2C EF: 50.7 %
Single Plane A4C EF: 69.3 %

## 2021-11-01 MED ORDER — TECHNETIUM TC 99M TETROFOSMIN IV KIT
10.0000 | PACK | Freq: Once | INTRAVENOUS | Status: AC | PRN
  Administered 2021-11-01: 11 via INTRAVENOUS

## 2021-11-01 MED ORDER — SODIUM CHLORIDE FLUSH 0.9 % IV SOLN
INTRAVENOUS | Status: AC
  Administered 2021-11-01: 10 mL via INTRAVENOUS
  Filled 2021-11-01: qty 10

## 2021-11-01 MED ORDER — REGADENOSON 0.4 MG/5ML IV SOLN
INTRAVENOUS | Status: AC
  Filled 2021-11-01: qty 5

## 2021-11-01 MED ORDER — TECHNETIUM TC 99M TETROFOSMIN IV KIT
30.0000 | PACK | Freq: Once | INTRAVENOUS | Status: AC | PRN
  Administered 2021-11-01: 30 via INTRAVENOUS

## 2021-11-01 NOTE — Progress Notes (Signed)
*  PRELIMINARY RESULTS* Echocardiogram 2D Echocardiogram has been performed.  Carolyne Fiscal 11/01/2021, 9:06 AM

## 2021-11-03 ENCOUNTER — Other Ambulatory Visit: Payer: Self-pay | Admitting: Family Medicine

## 2021-11-03 DIAGNOSIS — I10 Essential (primary) hypertension: Secondary | ICD-10-CM

## 2021-11-09 ENCOUNTER — Ambulatory Visit (INDEPENDENT_AMBULATORY_CARE_PROVIDER_SITE_OTHER): Payer: Medicare HMO | Admitting: Family Medicine

## 2021-11-09 ENCOUNTER — Encounter: Payer: Self-pay | Admitting: Family Medicine

## 2021-11-09 VITALS — BP 128/75 | HR 61 | Resp 16 | Ht 61.5 in | Wt 138.4 lb

## 2021-11-09 DIAGNOSIS — K219 Gastro-esophageal reflux disease without esophagitis: Secondary | ICD-10-CM

## 2021-11-09 DIAGNOSIS — I10 Essential (primary) hypertension: Secondary | ICD-10-CM

## 2021-11-09 DIAGNOSIS — E663 Overweight: Secondary | ICD-10-CM

## 2021-11-09 DIAGNOSIS — F322 Major depressive disorder, single episode, severe without psychotic features: Secondary | ICD-10-CM

## 2021-11-09 DIAGNOSIS — E785 Hyperlipidemia, unspecified: Secondary | ICD-10-CM | POA: Diagnosis not present

## 2021-11-09 NOTE — Patient Instructions (Addendum)
F/U end  November, flu vaccine at visit, call if you need me sooner  Please get covid booster  Lipid, cmp and eGFR, today   Thankful you are doing better  It is important that you exercise regularly at least 30 minutes 5 times a week. If you develop chest pain, have severe difficulty breathing, or feel very tired, stop exercising immediately and seek medical attention    Thanks for choosing  Primary Care, we consider it a privelige to serve you.

## 2021-11-10 LAB — LIPID PANEL
Chol/HDL Ratio: 2.9 ratio (ref 0.0–4.4)
Cholesterol, Total: 159 mg/dL (ref 100–199)
HDL: 55 mg/dL (ref >39)
LDL Chol Calc (NIH): 85 mg/dL (ref 0–99)
Triglycerides: 106 mg/dL (ref 0–149)
VLDL Cholesterol Cal: 19 mg/dL (ref 5–40)

## 2021-11-10 LAB — CMP14+EGFR
ALT: 15 IU/L (ref 0–32)
AST: 22 IU/L (ref 0–40)
Albumin/Globulin Ratio: 1.8 (ref 1.2–2.2)
Albumin: 4.6 g/dL (ref 3.7–4.7)
Alkaline Phosphatase: 117 IU/L (ref 44–121)
BUN/Creatinine Ratio: 23 (ref 12–28)
BUN: 22 mg/dL (ref 8–27)
Bilirubin Total: 0.4 mg/dL (ref 0.0–1.2)
CO2: 23 mmol/L (ref 20–29)
Calcium: 9.5 mg/dL (ref 8.7–10.3)
Chloride: 104 mmol/L (ref 96–106)
Creatinine, Ser: 0.94 mg/dL (ref 0.57–1.00)
Globulin, Total: 2.5 g/dL (ref 1.5–4.5)
Glucose: 102 mg/dL — ABNORMAL HIGH (ref 70–99)
Potassium: 4.3 mmol/L (ref 3.5–5.2)
Sodium: 141 mmol/L (ref 134–144)
Total Protein: 7.1 g/dL (ref 6.0–8.5)
eGFR: 64 mL/min/{1.73_m2} (ref >59)

## 2021-11-13 ENCOUNTER — Encounter: Payer: Self-pay | Admitting: Family Medicine

## 2021-11-13 NOTE — Assessment & Plan Note (Signed)
Controlled, no change in medication DASH diet and commitment to daily physical activity for a minimum of 30 minutes discussed and encouraged, as a part of hypertension management. The importance of attaining a healthy weight is also discussed.     11/09/2021    8:25 AM 10/24/2021    8:52 AM 09/13/2021    8:43 AM 08/30/2021    9:34 AM 07/11/2021    8:46 AM 06/28/2021   10:06 AM 06/16/2021    8:59 AM  BP/Weight  Systolic BP 128 122 113 128 147 133 137  Diastolic BP 75 64 71 78 66 75 74  Wt. (Lbs) 138.4 138.2 137.08 137.6 136.2 138.2 137.04  BMI 25.73 kg/m2 26.11 kg/m2 25.07 kg/m2 25.58 kg/m2 24.91 kg/m2 25.28 kg/m2 25.06 kg/m2

## 2021-11-13 NOTE — Assessment & Plan Note (Signed)
On no medication and reports symptom resolution, no interest in therapy

## 2021-11-13 NOTE — Assessment & Plan Note (Signed)
Controlled, no change in medication  

## 2021-11-13 NOTE — Assessment & Plan Note (Signed)
Hyperlipidemia:Low fat diet discussed and encouraged.   Lipid Panel  Lab Results  Component Value Date   CHOL 159 11/09/2021   HDL 55 11/09/2021   LDLCALC 85 11/09/2021   TRIG 106 11/09/2021   CHOLHDL 2.9 11/09/2021   Controlled, no change in medication

## 2021-11-13 NOTE — Progress Notes (Signed)
Colleen [name]     MRN: [id number]      DOB: [birth year]   HPI Colleen [name] is here for follow up and re-evaluation of chronic medical conditions, medication management and review of any available recent lab and radiology data.  Preventive health is updated, specifically  Cancer screening and Immunization.   Questions or concerns regarding consultations or procedures which the PT has had in the interim are  addressed. The PT denies any adverse reactions to current medications since the last visit. Took the anti depressant intermittently, did not like the lis of potential side effects, then stopped for past 2 weeks. Has support from family nd friends and is relying on increased faith in god to deal with her depression and anxiety and is pleased with result so far There are no new concerns.  There are no specific complaints   ROS Denies recent fever or chills. Denies sinus pressure, nasal congestion, ear pain or sore throat. Denies chest congestion, productive cough or wheezing. Denies chest pains, palpitations and leg swelling Denies abdominal pain, nausea, vomiting,diarrhea or constipation.   Denies dysuria, frequency, hesitancy or incontinence. Denies joint pain, swelling and limitation in mobility. Denies headaches, seizures, numbness, or tingling. Denies depression, uncontrolled anxiety or insomnia. Denies skin break down or rash.   PE  BP 128/75   Pulse 61   Resp 16   Ht 5' 1.5" (1.562 m)   Wt 138 lb 6.4 oz (62.8 kg)   SpO2 96%   BMI 25.73 kg/m   Patient alert and oriented and in no cardiopulmonary distress.  HEENT: No facial asymmetry, EOMI,     Neck supple .  Chest: Clear to auscultation bilaterally.  CVS: S1, S2 no murmurs, no S3.Regular rate.  ABD: Soft non tender.   Ext: No edema  MS: Adequate ROM spine, shoulders, hips and knees.  Skin: Intact, no ulcerations or rash noted.  Psych: Good eye contact, normal affect. Memory intact not anxious or depressed  appearing.  CNS: CN 2-12 intact, power,  normal throughout.no focal deficits noted.   Assessment & Plan  Essential hypertension Controlled, no change in medication DASH diet and commitment to daily physical activity for a minimum of 30 minutes discussed and encouraged, as a part of hypertension management. The importance of attaining a healthy weight is also discussed.     11/09/2021    8:25 AM 10/24/2021    8:52 AM 09/13/2021    8:43 AM 08/30/2021    9:34 AM 07/11/2021    8:46 AM 06/28/2021   10:06 AM 06/16/2021    8:59 AM  BP/Weight  Systolic BP 128 122 113 128 147 133 137  Diastolic BP 75 64 71 78 66 75 74  Wt. (Lbs) 138.4 138.2 137.08 137.6 136.2 138.2 137.04  BMI 25.73 kg/m2 26.11 kg/m2 25.07 kg/m2 25.58 kg/m2 24.91 kg/m2 25.28 kg/m2 25.06 kg/m2       Depression, major, single episode, severe (HCC) On no medication and reports symptom resolution, no interest in therapy  Hyperlipidemia Hyperlipidemia:Low fat diet discussed and encouraged.   Lipid Panel  Lab Results  Component Value Date   CHOL 159 11/09/2021   HDL 55 11/09/2021   LDLCALC 85 11/09/2021   TRIG 106 11/09/2021   CHOLHDL 2.9 11/09/2021   Controlled, no change in medication     GERD Controlled, no change in medication   Overweight (BMI 25.0-29.9)  Patient re-educated about  the importance of commitment to a  minimum of 150 minutes of exercise per  week as able.  The importance of healthy food choices with portion control discussed, as well as eating regularly and within a 12 hour window most days. The need to choose "clean , green" food 50 to 75% of the time is discussed, as well as to make water the primary drink and set a goal of 64 ounces water daily.       11/09/2021    8:25 AM 10/24/2021    8:52 AM 09/13/2021    8:43 AM  Weight /BMI  Weight 138 lb 6.4 oz 138 lb 3.2 oz 137 lb 1.3 oz  Height 5' 1.5" (1.562 m) 5\' 1"  (1.549 m) 5\' 2"  (1.575 m)  BMI 25.73 kg/m2 26.11 kg/m2 25.07 kg/m2    '

## 2021-11-13 NOTE — Assessment & Plan Note (Signed)
  Patient re-educated about  the importance of commitment to a  minimum of 150 minutes of exercise per week as able.  The importance of healthy food choices with portion control discussed, as well as eating regularly and within a 12 hour window most days. The need to choose "clean , green" food 50 to 75% of the time is discussed, as well as to make water the primary drink and set a goal of 64 ounces water daily.       11/09/2021    8:25 AM 10/24/2021    8:52 AM 09/13/2021    8:43 AM  Weight /BMI  Weight 138 lb 6.4 oz 138 lb 3.2 oz 137 lb 1.3 oz  Height 5' 1.5" (1.562 m) 5\' 1"  (1.549 m) 5\' 2"  (1.575 m)  BMI 25.73 kg/m2 26.11 kg/m2 25.07 kg/m2    '

## 2021-11-16 ENCOUNTER — Telehealth: Payer: Self-pay | Admitting: Family Medicine

## 2021-11-16 NOTE — Telephone Encounter (Signed)
Patient called in requesting call back. Wants to know why she needs another covid booster shot.

## 2021-11-16 NOTE — Telephone Encounter (Signed)
People aged 75 years and older may get 1 additional dose of COVID-19 vaccine 4 or more months after the 1st updated COVID-19 vaccine. This is the updated recommendation.

## 2021-11-29 ENCOUNTER — Ambulatory Visit: Payer: Medicare HMO | Attending: Pulmonary Disease | Admitting: Pulmonary Disease

## 2021-11-29 DIAGNOSIS — R0683 Snoring: Secondary | ICD-10-CM | POA: Diagnosis not present

## 2021-11-29 DIAGNOSIS — G4761 Periodic limb movement disorder: Secondary | ICD-10-CM | POA: Diagnosis not present

## 2021-12-01 ENCOUNTER — Ambulatory Visit (INDEPENDENT_AMBULATORY_CARE_PROVIDER_SITE_OTHER): Payer: Medicare HMO | Admitting: *Deleted

## 2021-12-01 DIAGNOSIS — I10 Essential (primary) hypertension: Secondary | ICD-10-CM

## 2021-12-01 DIAGNOSIS — E785 Hyperlipidemia, unspecified: Secondary | ICD-10-CM

## 2021-12-01 NOTE — Patient Instructions (Signed)
Visit Information  Thank you for taking time to visit with me today. Please don't hesitate to contact me if I can be of assistance to you before our next scheduled telephone appointment.  Following are the goals we discussed today:  Take medications as prescribed   Attend all scheduled provider appointments Call pharmacy for medication refills 3-7 days in advance of running out of medications Perform IADL's (shopping, preparing meals, housekeeping, managing finances) independently Call provider office for new concerns or questions  check blood pressure 3 times per week write blood pressure results in a log or diary learn about high blood pressure keep a blood pressure log take blood pressure log to all doctor appointments keep all doctor appointments take medications for blood pressure exactly as prescribed report new symptoms to your doctor eat more whole grains, fruits and vegetables, lean meats and healthy fats - call for medicine refill 2 or 3 days before it runs out - take all medications exactly as prescribed - call doctor with any symptoms you believe are related to your medicine - call doctor when you experience any new symptoms - adhere to prescribed diet: heart healthy, avoid trans/ saturated fats Continue checking blood pressure and keeping a log- keep up the good work Continue getting outdoors in the sunshine and walking, working in Training and development officer, keep stress to a minimum Call your doctor if you feel depression is worsening or medication not helping Case closure today, please talk to your doctor if you have any future needs for care management  Please call the care guide team at [phone] if you need to cancel or reschedule your appointment.   If you are experiencing a Mental Health or Behavioral Health Crisis or need someone to talk to, please call the Suicide and Crisis Lifeline: 988 call the [place] National Suicide Prevention Lifeline: (970)555-0789  or TTY: 432-385-2297 TTY (630) 654-9113) to talk to a trained counselor call 1-800-273-TALK (toll free, 24 hour hotline) go to [facility] [address] [place] [phone]) call 911   Patient verbalizes understanding of instructions and care plan provided today and agrees to view in MyChart. Active MyChart status and patient understanding of how to access instructions and care plan via MyChart confirmed with patient.     Irving Shows [facility], BSN RN Case Manager [place] [facility] [phone]

## 2021-12-01 NOTE — Chronic Care Management (AMB) (Signed)
Chronic Care Management   CCM RN Visit Note  12/01/2021 Name: Colleen [name] MRN: [id number] DOB: 08-02-1945  Subjective: Colleen [name] is a 76 y.o. year old female who is a primary care patient of Fayrene Helper, MD. The care management team was consulted for assistance with disease management and care coordination needs.    Engaged with patient by telephone for follow up visit in response to provider referral for case management and/or care coordination services.   Consent to Services:  The patient was given information about Chronic Care Management services, agreed to services, and gave verbal consent prior to initiation of services.  Please see initial visit note for detailed documentation.   Patient agreed to services and verbal consent obtained.   Assessment: Review of patient past medical history, allergies, medications, health status, including review of consultants reports, laboratory and other test data, was performed as part of comprehensive evaluation and provision of chronic care management services.   SDOH (Social Determinants of Health) assessments and interventions performed:    CCM Care Plan  Allergies  Allergen Reactions   Aspirin Nausea Only    Upset stomach (higher doses)   Fenofibrate Other (See Comments)    Muscle aches   Statins Other (See Comments)    Muscle cramps   Flonase [Fluticasone] Other (See Comments)    Caused increased pressure in the eye    Outpatient Encounter Medications as of 12/01/2021  Medication Sig   acetaminophen (TYLENOL) 650 MG CR tablet Take 650 mg by mouth every 8 (eight) hours as needed for pain.    Artificial Tear Solution (SOOTHE XP) SOLN Place 1 application into both eyes 3 (three) times daily.    Ascorbic Acid (VITAMIN C) 1000 MG tablet Take 1,000 mg by mouth 2 (two) times a week.    Cholecalciferol (VITAMIN D3) 125 MCG (5000 UT) CAPS Take 5,000 Units by mouth daily.    Coenzyme Q10 (COQ10) 200 MG CAPS Take 200 mg by mouth  daily.   diphenhydrAMINE (BENADRYL) 25 MG tablet Take 25 mg by mouth daily as needed for allergies.    ibuprofen (ADVIL) 200 MG tablet Take 600-800 mg by mouth every 6 (six) hours as needed.   latanoprost (XALATAN) 0.005 % ophthalmic solution Place 1 drop into both eyes at bedtime.   lisinopril-hydrochlorothiazide (ZESTORETIC) 20-25 MG tablet TAKE 1 TABLET EVERY DAY   Multiple Vitamin (MULTIVITAMIN WITH MINERALS) TABS tablet Take 1 tablet by mouth daily.   omeprazole (PRILOSEC) 20 MG capsule Take 43m 30 minutes before a meal once or twice daily as needed.   polyethylene glycol powder (MIRALAX) 17 GM/SCOOP powder Take one capful twice daily until soft bowel movement, then continue once daily as needed to maintain regular bowel movement. (Patient taking differently: Twice a week)   rosuvastatin (CRESTOR) 5 MG tablet TAKE 1 TABLET ON MONDAY, WEDNESDAY, FRIDAY AND SUNDAY IN THE MORNING   No facility-administered encounter medications on file as of 12/01/2021.    Patient Active Problem List   Diagnosis Date Noted   DOE (dyspnea on exertion) 10/24/2021   Myalgia due to statin 10/24/2021   Heart murmur, systolic 056/38/7564  Left shoulder pain 09/13/2021   Dep[place] major, [place] episode, severe [place] 09/13/2021   Arthritis, shoulder region right 07/11/2021   Cervical disc disease 07/11/2021   Snoring 06/16/2021   Fatigue 06/16/2021   Chronic cough 06/16/2021   Constipation 08/23/2020   Overweight (BMI 25.0-29.9) 11/20/2018   Abnormal finding on EKG 03/16/2018   Neck pain  on right side 03/11/2018   Hip pain, right 03/11/2018   Back pain with radiation 03/11/2018   Primary osteoarthritis of left knee    Left knee pain 11/15/2014   IFG (impaired fasting glucose) 10/13/2013   ALLERGIC RHINITIS, SEASONAL 03/30/2008   Hyperlipidemia 09/17/2007   Essential hypertension 09/17/2007   GERD 09/17/2007   Osteopenia 09/17/2007    Conditions to be addressed/monitored:HTN and HLD  Care Plan :  RN Care Manager plan of care  Updates made by Kassie Mends, RN since 12/01/2021 12:00 AM     Problem: No plan of care established for management of chronic disease states (HTN, HLD) Resolved 12/01/2021  Priority: High     Long-Range Goal: Development of plan of care for chronic disease management (HTN, HLD) Completed 12/01/2021  Start Date: 03/29/2021  Expected End Date: 03/14/2022  Priority: High  Note:   Current Barriers:  Knowledge Deficits related to plan of care for management of HTN and HLD  Patient reports she lives with her spouse, has adult children she can call on if needed, is independent in all aspects of her care, continues to drive, does light exercises sometimes at home, usually eats a regular diet, trying to drink more water.  Reports does not have advance directives and did receive documents in the mail but has not completed. 12/01/21 Patient reports she continues checking blood pressure several times per week and states "readings are good" ASCVD risk enhancing conditions: age >32, HTN Patient reports she has had chronic issues with constipation and is seeing gastroenterologist, taking medications and states "this has helped" Patient reports she saw primary care provider for episode of depression and is taking her medication as prescribed, getting outdoors to walk in the sunshine and work in her flowers, and reports this helps, pt states medication is helping,  saw doctor on 09/13/21 for left shoulder pain and now taking meloxicam and "this is helping so far", recently had sleep study and is awaiting the results  RNCM Clinical Goal(s):  Patient will verbalize understanding of plan for management of HTN and HLD as evidenced by patient report, review EHR and  through collaboration with RN Care manager, provider, and care team.   Interventions: 1:1 collaboration with primary care provider regarding development and update of comprehensive plan of care as evidenced by provider  attestation and co-signature Inter-disciplinary care team collaboration (see longitudinal plan of care) Evaluation of current treatment plan related to  self management and patient's adherence to plan as established by provider  Hypertension Interventions: Last practice recorded BP readings:  BP Readings from Last 3 Encounters:  03/22/21 (!) 144/85  12/24/20 132/77  11/16/20 129/72  Most recent eGFR/CrCl:  Lab Results  Component Value Date   EGFR 64 11/09/2020    No components found for: CRCL  Evaluation of current treatment plan related to hypertension self management and patient's adherence to plan as established by provider Reviewed medications with patient and discussed importance of compliance Counseled on the importance of exercise goals with target of 150 minutes per week Advised patient, providing education and rationale, to monitor blood pressure daily and record, calling PCP for findings outside established parameters Encouraged patient to continue getting outdoors and working in her flowers Reviewed upcoming scheduled appointments Reviewed with patient to continue checking blood pressure and keeping a log Reviewed low sodium diet and importance of reading labels Reviewed importance of self care, relaxation and keeping stress to a minimum Reviewed plan of care with patient including case closure today  Hyperlipidemia:  (Status: Goal on Track (progressing): YES.) Long Term Goal  Lab Results  Component Value Date   CHOL 158 11/09/2020   HDL 57 11/09/2020   LDLCALC 90 11/09/2020   TRIG 50 11/09/2020   CHOLHDL 3.7 03/10/2020     Medication review performed; medication list updated in electronic medical record.  Counseled on importance of regular laboratory monitoring as prescribed; Reviewed role and benefits of statin for ASCVD risk reduction; Reviewed importance of limiting foods high in cholesterol; Reviewed exercise goals and target of 150 minutes per  week; Reinforced heart healthy diet and food choices Reinforced pain management strategies  Patient Goals/Self-Care Activities: Take medications as prescribed   Attend all scheduled provider appointments Call pharmacy for medication refills 3-7 days in advance of running out of medications Perform IADL's (shopping, preparing meals, housekeeping, managing finances) independently Call provider office for new concerns or questions  check blood pressure 3 times per week write blood pressure results in a log or diary learn about high blood pressure keep a blood pressure log take blood pressure log to all doctor appointments keep all doctor appointments take medications for blood pressure exactly as prescribed report new symptoms to your doctor eat more whole grains, fruits and vegetables, lean meats and healthy fats - call for medicine refill 2 or 3 days before it runs out - take all medications exactly as prescribed - call doctor with any symptoms you believe are related to your medicine - call doctor when you experience any new symptoms - adhere to prescribed diet: heart healthy, avoid trans/ saturated fats Continue checking blood pressure and keeping a log- keep up the good work Continue getting outdoors in the sunshine and walking, working in Health visitor, keep stress to a minimum Call your doctor if you feel depression is worsening or medication not helping Case closure today, please talk to your doctor if you have any future needs for care management       Plan:No further follow up required: case closure today  Jacqlyn Larsen [facility], BSN RN Case Advertising copywriter Primary Care [phone]

## 2021-12-05 DIAGNOSIS — I10 Essential (primary) hypertension: Secondary | ICD-10-CM | POA: Diagnosis not present

## 2021-12-05 DIAGNOSIS — E785 Hyperlipidemia, unspecified: Secondary | ICD-10-CM | POA: Diagnosis not present

## 2021-12-13 ENCOUNTER — Telehealth: Payer: Self-pay | Admitting: Pulmonary Disease

## 2021-12-13 DIAGNOSIS — R0683 Snoring: Secondary | ICD-10-CM | POA: Diagnosis not present

## 2021-12-13 NOTE — Procedures (Signed)
     Patient Name: Colleen [name], Colleen [name] Date: 11/29/2021 Gender: Female D.O.B: 07-Jan-1946 Age (years): 72 Referring Provider: Coralyn Helling MD, ABSM Height (inches): 62 Interpreting Physician: Coralyn Helling MD, ABSM Weight (lbs): 137 RPSGT: Alfonso Ellis BMI: 25 MRN: [id number] Neck Size: 13.50  CLINICAL INFORMATION Sleep Study Type: NPSG  Indication for sleep study: snoring, sleep disruption.  Epworth Sleepiness Score: 9  SLEEP STUDY TECHNIQUE As per the AASM Manual for the Scoring of Sleep and Associated Events v2.3 (April 2016) with a hypopnea requiring 4% desaturations.  The channels recorded and monitored were frontal, central and occipital EEG, electrooculogram (EOG), submentalis EMG (chin), nasal and oral airflow, thoracic and abdominal wall motion, anterior tibialis EMG, snore microphone, electrocardiogram, and pulse oximetry.  MEDICATIONS Medications self-administered by patient taken the night of the study : N/A  SLEEP ARCHITECTURE The study was initiated at 10:34:09 PM and ended at 5:02:11 AM.  Sleep onset time was 18.7 minutes and the sleep efficiency was 51.9%. The total sleep time was 201.5 minutes.  Stage REM latency was 223.0 minutes.  The patient spent 8.44% of the night in stage N1 sleep, 59.55% in stage N2 sleep, 25.06% in stage N3 and 7% in REM.  Alpha intrusion was absent.  Supine sleep was 4.22%.  RESPIRATORY PARAMETERS The overall apnea/hypopnea index (AHI) was 2.7 per hour. There were 3 total apneas, including 3 obstructive, 0 central and 0 mixed apneas. There were 6 hypopneas and 10 RERAs.  The AHI during Stage REM sleep was 21.4 per hour.  AHI while supine was 0.0 per hour.  The mean oxygen saturation was 95.08%. The minimum SpO2 during sleep was 89.00%.  moderate snoring was noted during this study.  CARDIAC DATA The 2 lead EKG demonstrated sinus rhythm. The mean heart rate was 56.10 beats per minute. Other EKG findings include:  none.  LEG MOVEMENT DATA The total PLMS were 138 with a resulting PLMS index of 41.09. Associated arousal with leg movement index was 3.9.  IMPRESSIONS - No significant obstructive sleep apnea occurred during this study (AHI = 2.7/h). - The patient had minimal or no oxygen desaturation during the study (Min O2 = 89.00%) - The patient snored with moderate snoring volume.. 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 No significant associated arousals.  DIAGNOSIS - Snoring. - Periodic Limb Movements of Sleep.  RECOMMENDATIONS - Assess for the presence of restless leg syndrome. - Avoid alcohol, sedatives and other CNS depressants that may worsen sleep apnea and disrupt normal sleep architecture. - Sleep hygiene should be reviewed to assess factors that may improve sleep quality. - Weight management and regular exercise should be initiated or continued if appropriate.  [Electronically signed] 12/13/2021 03:45 PM  Coralyn Helling MD, ABSM Diplomate, American Board of Sleep Medicine NPI: [id number]  [facility] PH: [phone]   FX: [phone] ACCREDITED BY THE AMERICAN ACADEMY OF SLEEP MEDICINE

## 2021-12-13 NOTE — Telephone Encounter (Signed)
Called and spoke to patient and went over results with her.  She voiced understanding. Offered a sooner appt in [place] but patient refused, scheduled her for next available in [facility] office: 02-07-2022 at 9:45 am. Nothing further needed. Appt reminder mailed to patient.

## 2021-12-13 NOTE — Telephone Encounter (Signed)
PSG 11/29/21 >> AHI 2.7, SpO2 low 89%, PLMI 41.09.   Please let her know her sleep study did not show sleep apnea.  It did show she has frequent leg movements while asleep.  Please schedule ROV with me to discuss sleep study findings in more detail.

## 2022-01-05 ENCOUNTER — Ambulatory Visit (HOSPITAL_COMMUNITY)
Admission: RE | Admit: 2022-01-05 | Discharge: 2022-01-05 | Disposition: A | Discharge status: Home / Self Care | Payer: Medicare HMO | Source: Ambulatory Visit | Attending: Family Medicine | Admitting: Family Medicine

## 2022-01-05 DIAGNOSIS — Z1231 Encounter for screening mammogram for malignant neoplasm of breast: Secondary | ICD-10-CM | POA: Insufficient documentation

## 2022-01-06 ENCOUNTER — Other Ambulatory Visit (HOSPITAL_COMMUNITY): Payer: Self-pay | Admitting: Family Medicine

## 2022-01-06 DIAGNOSIS — R928 Other abnormal and inconclusive findings on diagnostic imaging of breast: Secondary | ICD-10-CM

## 2022-01-11 ENCOUNTER — Ambulatory Visit (HOSPITAL_COMMUNITY)
Admission: RE | Admit: 2022-01-11 | Discharge: 2022-01-11 | Disposition: A | Discharge status: Home / Self Care | Payer: Medicare HMO | Source: Ambulatory Visit | Attending: Family Medicine | Admitting: Family Medicine

## 2022-01-11 DIAGNOSIS — R928 Other abnormal and inconclusive findings on diagnostic imaging of breast: Secondary | ICD-10-CM | POA: Insufficient documentation

## 2022-01-18 ENCOUNTER — Ambulatory Visit (INDEPENDENT_AMBULATORY_CARE_PROVIDER_SITE_OTHER): Payer: Medicare HMO

## 2022-01-18 DIAGNOSIS — Z23 Encounter for immunization: Secondary | ICD-10-CM

## 2022-02-07 ENCOUNTER — Encounter: Payer: Self-pay | Admitting: Pulmonary Disease

## 2022-02-07 ENCOUNTER — Ambulatory Visit: Payer: Medicare HMO | Admitting: Pulmonary Disease

## 2022-02-07 VITALS — BP 128/68 | HR 58 | Temp 98.4°F | Ht 62.0 in | Wt 143.6 lb

## 2022-02-07 DIAGNOSIS — G4761 Periodic limb movement disorder: Secondary | ICD-10-CM | POA: Diagnosis not present

## 2022-02-07 DIAGNOSIS — R0683 Snoring: Secondary | ICD-10-CM | POA: Diagnosis not present

## 2022-02-07 MED ORDER — AZELASTINE HCL 0.15 % NA SOLN: 1.0000 | Freq: Every day | NASAL | Status: DC | PRN

## 2022-02-07 NOTE — Progress Notes (Signed)
[facility]  Chief Complaint  Patient presents with   Follow-up    Appt to talk about HST results     Past Surgical History:  She  has a past surgical history that includes Cholecystectomy; Knee arthroscopy with lateral menisectomy (Left, 12/11/2014); Chondroplasty (Left, 12/11/2014); and Colonoscopy with propofol (N/A, 02/27/2020).  Past Medical History:  GERD, Glaucoma, HLD, HTN, Vit D deficiency, Cervical disc disease  Constitutional:  BP 128/68 (BP Location: Left Arm, Patient Position: Sitting)   Pulse (!) 58   Temp 98.4 F (36.9 C) (Temporal)   Ht 5\' 2"  (1.575 m)   Wt 143 lb 9.6 oz (65.1 kg)   SpO2 98% Comment: ra  BMI 26.26 kg/m   Brief Summary:  Colleen [name] is a 76 y.o. female former smoker with sleep disruption.      Subjective:   Her sleep study did not show sleep apnea.  She did have elevation in her periodic limb movement index.  She gets occasional leg cramps.  She denies funny leg feelings at night or need to move her legs at night before going to sleep.   Physical Exam:   Appearance - well kempt   ENMT - no sinus tenderness, no oral exudate, no LAN, Mallampati 3 airway, no stridor  Respiratory - equal breath sounds bilaterally, no wheezing or rales  CV - s1s2 regular rate and rhythm, no murmurs  Ext - no clubbing, no edema  Skin - no rashes  Psych - normal mood and affect    Sleep Tests:  PSG 11/29/21 >> AHI 2.7, SpO2 low 89%, PLMI 41.09.  Social History:  She  reports that she has quit smoking. She has never used smokeless tobacco. She reports that she does not drink alcohol and does not use drugs.  Family History:  Her family history includes Diabetes in her father, sister, and sister; Heart disease in her sister; Heart disease (age of onset: 24) in her mother; Hypertension in her brother, father, mother, sister, and sister; Kidney disease in her sister; Multiple sclerosis in her sister;  Schizophrenia (age of onset: 31) in her brother; Stroke in her father.     Assessment/Plan:   Snoring. - home sleep study did not show significant sleep apnea - conservative management  Periodic limb movements of sleep. - she does not describe symptoms to go along with restless leg syndrome - no additional intervention needed for this at present - discussed symptoms to monitor for that would indicate she needs repeat assessment   Time Spent Involved in Patient Care on Day of Examination:  17 minutes  Follow up:   Patient Instructions  Follow up as needed if your sleep pattern gets worse  Medication List:   Allergies as of 02/07/2022       Reactions   Aspirin Nausea Only   Upset stomach (higher doses)   Fenofibrate Other (See Comments)   Muscle aches   Statins Other (See Comments)   Muscle cramps   Flonase [fluticasone] Other (See Comments)   Caused increased pressure in the eye        Medication List        Accurate as of February 07, 2022 10:22 AM. If you have any questions, ask your nurse or doctor.          acetaminophen 650 MG CR tablet Commonly known as: TYLENOL Take 650 mg by mouth every 8 (eight) hours as needed for pain.   Azelastine HCl 0.15 %  Soln Commonly known as: Astepro Place 1 spray into the nose daily as needed. Started by: Chesley Mires, MD   CoQ10 200 MG Caps Take 200 mg by mouth daily.   diphenhydrAMINE 25 MG tablet Commonly known as: BENADRYL Take 25 mg by mouth daily as needed for allergies.   ibuprofen 200 MG tablet Commonly known as: ADVIL Take 600-800 mg by mouth every 6 (six) hours as needed.   latanoprost 0.005 % ophthalmic solution Commonly known as: XALATAN Place 1 drop into both eyes at bedtime.   lisinopril-hydrochlorothiazide 20-25 MG tablet Commonly known as: ZESTORETIC TAKE 1 TABLET EVERY DAY   multivitamin with minerals Tabs tablet Take 1 tablet by mouth daily.   omeprazole 20 MG capsule Commonly known as:  PRILOSEC Take 20mg  30 minutes before a meal once or twice daily as needed.   polyethylene glycol powder 17 GM/SCOOP powder Commonly known as: MiraLax Take one capful twice daily until soft bowel movement, then continue once daily as needed to maintain regular bowel movement. What changed: additional instructions   rosuvastatin 5 MG tablet Commonly known as: CRESTOR TAKE 1 TABLET ON MONDAY, WEDNESDAY, FRIDAY AND SUNDAY IN THE MORNING   Soothe XP Soln Place 1 application into both eyes 3 (three) times daily.   vitamin C 1000 MG tablet Take 1,000 mg by mouth 2 (two) times a week.   Vitamin D3 125 MCG (5000 UT) Caps Take 5,000 Units by mouth daily.        Signature:  Chesley Mires, MD [place] Pager - [phone] 02/07/2022, 10:22 AM

## 2022-02-07 NOTE — Patient Instructions (Addendum)
Follow up as needed if your sleep pattern gets worse

## 2022-02-28 DIAGNOSIS — H5203 Hypermetropia, bilateral: Secondary | ICD-10-CM | POA: Diagnosis not present

## 2022-03-20 ENCOUNTER — Other Ambulatory Visit: Payer: Self-pay | Admitting: Family Medicine

## 2022-03-20 DIAGNOSIS — E785 Hyperlipidemia, unspecified: Secondary | ICD-10-CM

## 2022-04-05 ENCOUNTER — Ambulatory Visit (INDEPENDENT_AMBULATORY_CARE_PROVIDER_SITE_OTHER): Payer: Medicare HMO | Admitting: Family Medicine

## 2022-04-05 ENCOUNTER — Encounter: Payer: Self-pay | Admitting: Family Medicine

## 2022-04-05 VITALS — BP 140/70 | HR 64 | Resp 16 | Ht 61.5 in | Wt 141.0 lb

## 2022-04-05 DIAGNOSIS — F322 Major depressive disorder, single episode, severe without psychotic features: Secondary | ICD-10-CM

## 2022-04-05 DIAGNOSIS — I10 Essential (primary) hypertension: Secondary | ICD-10-CM | POA: Diagnosis not present

## 2022-04-05 DIAGNOSIS — R8290 Unspecified abnormal findings in urine: Secondary | ICD-10-CM

## 2022-04-05 MED ORDER — AMLODIPINE BESYLATE 2.5 MG PO TABS: 2.5000 mg | ORAL_TABLET | Freq: Every day | ORAL | 2 refills | Status: DC

## 2022-04-05 NOTE — Patient Instructions (Signed)
F/U in 5 weeks, re evaluate blood pressure, call if you need me sooner  New additional medicine for blood pressure amlodipine 2.5 mg one daily   Think of something fun and make the effort to do it!   Chem 7 and EGFr and Mg today   CCUA today due to odor in urine  It is important that you exercise regularly at least 30 minutes 5 times a week. If you develop chest pain, have severe difficulty breathing, or feel very tired, stop exercising immediately and seek medical attention  Thanks for choosing [facility], we consider it a privelige to serve you.  

## 2022-04-06 LAB — BMP8+EGFR
BUN/Creatinine Ratio: 21 (ref 12–28)
BUN: 20 mg/dL (ref 8–27)
CO2: 24 mmol/L (ref 20–29)
Calcium: 9.8 mg/dL (ref 8.7–10.3)
Chloride: 105 mmol/L (ref 96–106)
Creatinine, Ser: 0.97 mg/dL (ref 0.57–1.00)
Glucose: 95 mg/dL (ref 70–99)
Potassium: 4.2 mmol/L (ref 3.5–5.2)
Sodium: 142 mmol/L (ref 134–144)
eGFR: 61 mL/min/{1.73_m2} (ref >59)

## 2022-04-06 LAB — MAGNESIUM: Magnesium: 2.1 mg/dL (ref 1.6–2.3)

## 2022-04-10 ENCOUNTER — Encounter: Payer: Self-pay | Admitting: Family Medicine

## 2022-04-10 DIAGNOSIS — H2513 Age-related nuclear cataract, bilateral: Secondary | ICD-10-CM | POA: Diagnosis not present

## 2022-04-10 DIAGNOSIS — H40053 Ocular hypertension, bilateral: Secondary | ICD-10-CM | POA: Diagnosis not present

## 2022-04-10 DIAGNOSIS — H01002 Unspecified blepharitis right lower eyelid: Secondary | ICD-10-CM | POA: Diagnosis not present

## 2022-04-10 DIAGNOSIS — H01001 Unspecified blepharitis right upper eyelid: Secondary | ICD-10-CM | POA: Diagnosis not present

## 2022-04-10 DIAGNOSIS — R8290 Unspecified abnormal findings in urine: Secondary | ICD-10-CM | POA: Insufficient documentation

## 2022-04-10 NOTE — Assessment & Plan Note (Signed)
Not suicidal or homicidal, would benefit from therapy, no interest

## 2022-04-10 NOTE — Addendum Note (Signed)
Addended by: Syliva Overman E on: 04/10/2022 12:03 PM   Modules accepted: Level of Service

## 2022-04-10 NOTE — Assessment & Plan Note (Signed)
CCUA negative for infection 

## 2022-04-10 NOTE — Progress Notes (Signed)
   Colleen [name]     MRN: [id number]      DOB: February 10, 1946   HPI [name] is here for follow up and re-evaluation of chronic medical conditions, medication management and review of any available recent lab and radiology data.  Preventive health is updated, specifically  Cancer screening and Immunization.   Questions or concerns regarding consultations or procedures which the PT has had in the interim are  addressed. The PT denies any adverse reactions to current medications since the last visit.  C/o fatigue, little interest in doing anything , feels stressed about her husband says he feels he has to do "man things". Relationship has been bad for years, had started antidepressants but does not want these C/o malodorous urine a 1 week ROS Denies recent fever or chills. Denies sinus pressure, nasal congestion, ear pain or sore throat. Denies chest congestion, productive cough or wheezing. Denies chest pains, palpitations and leg swelling Denies abdominal pain, nausea, vomiting,diarrhea or constipation.   Denies dysuria, frequency, hesitancy or incontinence. Denies joint pain, swelling and limitation in mobility. Denies headaches, seizures, numbness, or tingling. . Denies skin break down or rash.   PE  BP (!) 140/70   Pulse 64   Resp 16   Ht 5' 1.5" (1.562 m)   Wt 141 lb (64 kg)   SpO2 92%   BMI 26.21 kg/m   Patient alert and oriented and in no cardiopulmonary distress.  HEENT: No facial asymmetry, EOMI,     Neck supple .  Chest: Clear to auscultation bilaterally.  CVS: S1, S2 no murmurs, no S3.Regular rate.  ABD: Soft non tender.   Ext: No edema  MS: Adequate ROM spine, shoulders, hips and knees.  Skin: Intact, no ulcerations or rash noted.  Psych: Poor eye contact, flat affect. Memory intact not anxious but  depressed appearing. Not suicidal or homicidal CNS: CN 2-12 intact, power,  normal throughout.no focal deficits noted.   Assessment & Plan  Essential  hypertension Unontrolled, add amlodipine  DASH diet and commitment to daily physical activity for a minimum of 30 minutes discussed and encouraged, as a part of hypertension management. The importance of attaining a healthy weight is also discussed.     04/05/2022    8:34 AM 04/05/2022    8:10 AM 02/07/2022    9:46 AM 11/09/2021    8:25 AM 10/24/2021    8:52 AM 09/13/2021    8:43 AM 08/30/2021    9:34 AM  BP/Weight  Systolic BP 140 151 128 128 122 113 128  Diastolic BP 70 76 68 75 64 71 78  Wt. (Lbs)  141 143.6 138.4 138.2 137.08 137.6  BMI  26.21 kg/m2 26.26 kg/m2 25.73 kg/m2 26.11 kg/m2 25.07 kg/m2 25.58 kg/m2       Depression, major, single episode, severe (HCC) Not suicidal or homicidal, would benefit from therapy, no interest  Abnormal urine odor CCUA negative for infection

## 2022-04-10 NOTE — Assessment & Plan Note (Signed)
Unontrolled, add amlodipine  DASH diet and commitment to daily physical activity for a minimum of 30 minutes discussed and encouraged, as a part of hypertension management. The importance of attaining a healthy weight is also discussed.     04/05/2022    8:34 AM 04/05/2022    8:10 AM 02/07/2022    9:46 AM 11/09/2021    8:25 AM 10/24/2021    8:52 AM 09/13/2021    8:43 AM 08/30/2021    9:34 AM  BP/Weight  Systolic BP 140 151 128 128 122 113 128  Diastolic BP 70 76 68 75 64 71 78  Wt. (Lbs)  141 143.6 138.4 138.2 137.08 137.6  BMI  26.21 kg/m2 26.26 kg/m2 25.73 kg/m2 26.11 kg/m2 25.07 kg/m2 25.58 kg/m2

## 2022-05-10 ENCOUNTER — Encounter: Payer: Self-pay | Admitting: Family Medicine

## 2022-05-10 ENCOUNTER — Ambulatory Visit (INDEPENDENT_AMBULATORY_CARE_PROVIDER_SITE_OTHER): Payer: Medicare HMO | Admitting: Family Medicine

## 2022-05-10 VITALS — BP 130/72 | HR 69 | Ht 62.0 in | Wt 142.0 lb

## 2022-05-10 DIAGNOSIS — J301 Allergic rhinitis due to pollen: Secondary | ICD-10-CM | POA: Diagnosis not present

## 2022-05-10 DIAGNOSIS — E785 Hyperlipidemia, unspecified: Secondary | ICD-10-CM | POA: Diagnosis not present

## 2022-05-10 DIAGNOSIS — F322 Major depressive disorder, single episode, severe without psychotic features: Secondary | ICD-10-CM

## 2022-05-10 DIAGNOSIS — Z23 Encounter for immunization: Secondary | ICD-10-CM

## 2022-05-10 DIAGNOSIS — I10 Essential (primary) hypertension: Secondary | ICD-10-CM | POA: Diagnosis not present

## 2022-05-10 MED ORDER — UNABLE TO FIND: 0 refills | Status: DC

## 2022-05-10 MED ORDER — LISINOPRIL-HYDROCHLOROTHIAZIDE 20-25 MG PO TABS
1.0000 | ORAL_TABLET | Freq: Every day | ORAL | 3 refills | Status: DC
Start: 2022-05-10 — End: 2022-10-11

## 2022-05-10 MED ORDER — AMLODIPINE BESYLATE 2.5 MG PO TABS
2.5000 mg | ORAL_TABLET | Freq: Every day | ORAL | 3 refills | Status: DC
Start: 2022-05-10 — End: 2022-05-24

## 2022-05-10 NOTE — Patient Instructions (Addendum)
Annual exam in 4 months, call if you need me sooner.  Excellent blood pressure continue current medication.  Labs today fasting lipid CMP and EGFR and TSH.  It is important that you exercise regularly at least 30 minutes 5 times a week. If you develop chest pain, have severe difficulty breathing, or feel very tired, stop exercising immediately and seek medical attention   Thanks for choosing [facility], we consider it a privelige to serve you.  

## 2022-05-10 NOTE — Progress Notes (Signed)
   Colleen [name]     MRN: [id number]      DOB: 08/08/1945   HPI Colleen [name] is here for follow up and re-evaluation of chronic medical conditions, medication management and review of any available recent lab and radiology data.  Preventive health is updated, specifically  Cancer screening and Immunization.   Questions or concerns regarding consultations or procedures which the PT has had in the interim are  addressed. The PT denies any adverse reactions to current medications since the last visit.  Since 03/13/2022, increased arhtiritis pain up to a 6    ROS Denies recent fever or chills. Denies sinus pressure, nasal congestion, ear pain or sore throat. Denies chest congestion, productive cough or wheezing. Denies chest pains, palpitations and leg swelling Denies abdominal pain, nausea, vomiting,diarrhea or constipation.   Denies dysuria, frequency, hesitancy or incontinence. C/o generalized  joint pain, no significant limitation in mobility. Denies headaches, seizures, numbness, or tingling. Denies depression, anxiety or insomnia. Denies skin break down or rash.   PE  BP 131/72 (BP Location: Right Arm, Patient Position: Sitting, Cuff Size: Normal)   Pulse 69   Ht 5\' 2"  (1.575 m)   Wt 142 lb (64.4 kg)   SpO2 95%   BMI 25.97 kg/m   Patient alert and oriented and in no cardiopulmonary distress.  HEENT: No facial asymmetry, EOMI,     Neck supple .  Chest: Clear to auscultation bilaterally.  CVS: S1, S2 no murmurs, no S3.Regular rate.  ABD: Soft non tender.   Ext: No edema  MS: Adequate ROM spine, shoulders, hips and knees.  Skin: Intact, no ulcerations or rash noted.  Psych: Good eye contact, normal affect. Memory intact not anxious or depressed appearing.  CNS: CN 2-12 intact, power,  normal throughout.no focal deficits noted.   Assessment & Plan  Essential hypertension Controlled, no change in medication DASH diet and commitment to daily physical activity for a  minimum of 30 minutes discussed and encouraged, as a part of hypertension management. The importance of attaining a healthy weight is also discussed.     05/10/2022   10:27 AM 05/10/2022    9:50 AM 04/05/2022    8:34 AM 04/05/2022    8:10 AM 02/07/2022    9:46 AM 11/09/2021    8:25 AM 10/24/2021    8:52 AM  BP/Weight  Systolic BP 263 785 885 027 741 287 867  Diastolic BP 72 72 70 76 68 75 64  Wt. (Lbs)  142  141 143.6 138.4 138.2  BMI  25.97 kg/m2  26.21 kg/m2 26.26 kg/m2 25.73 kg/m2 26.11 kg/m2       Hyperlipidemia Hyperlipidemia:Low fat diet discussed and encouraged.   Lipid Panel  Lab Results  Component Value Date   CHOL 162 05/10/2022   HDL 50 05/10/2022   LDLCALC 98 05/10/2022   TRIG 70 05/10/2022   CHOLHDL 3.2 05/10/2022     Controlled, no change in medication   ALLERGIC RHINITIS, SEASONAL Controlled, no change in medication   Depression, major, single episode, severe (HCC) Improved , however would benefit from therapy but no interest. Depression related to the fact that her husband is an alcoholic , not suicidal or homicidal, has support from family and fiends, no interest in medication, took 2 tabs then stopped

## 2022-05-11 LAB — LIPID PANEL
Chol/HDL Ratio: 3.2 ratio (ref 0.0–4.4)
Cholesterol, Total: 162 mg/dL (ref 100–199)
HDL: 50 mg/dL (ref >39)
LDL Chol Calc (NIH): 98 mg/dL (ref 0–99)
Triglycerides: 70 mg/dL (ref 0–149)
VLDL Cholesterol Cal: 14 mg/dL (ref 5–40)

## 2022-05-11 LAB — CMP14+EGFR
ALT: 17 IU/L (ref 0–32)
AST: 23 IU/L (ref 0–40)
Albumin/Globulin Ratio: 1.7 (ref 1.2–2.2)
Albumin: 4.7 g/dL (ref 3.8–4.8)
Alkaline Phosphatase: 110 IU/L (ref 44–121)
BUN/Creatinine Ratio: 17 (ref 12–28)
BUN: 14 mg/dL (ref 8–27)
Bilirubin Total: 0.3 mg/dL (ref 0.0–1.2)
CO2: 23 mmol/L (ref 20–29)
Calcium: 10 mg/dL (ref 8.7–10.3)
Chloride: 104 mmol/L (ref 96–106)
Creatinine, Ser: 0.84 mg/dL (ref 0.57–1.00)
Globulin, Total: 2.7 g/dL (ref 1.5–4.5)
Glucose: 101 mg/dL — ABNORMAL HIGH (ref 70–99)
Potassium: 3.9 mmol/L (ref 3.5–5.2)
Sodium: 142 mmol/L (ref 134–144)
Total Protein: 7.4 g/dL (ref 6.0–8.5)
eGFR: 72 mL/min/{1.73_m2} (ref >59)

## 2022-05-11 LAB — TSH: TSH: 1.45 u[IU]/mL (ref 0.450–4.500)

## 2022-05-14 NOTE — Assessment & Plan Note (Signed)
Hyperlipidemia:Low fat diet discussed and encouraged.   Lipid Panel  Lab Results  Component Value Date   CHOL 162 05/10/2022   HDL 50 05/10/2022   LDLCALC 98 05/10/2022   TRIG 70 05/10/2022   CHOLHDL 3.2 05/10/2022     Controlled, no change in medication

## 2022-05-14 NOTE — Assessment & Plan Note (Signed)
Controlled, no change in medication  

## 2022-05-14 NOTE — Assessment & Plan Note (Signed)
Controlled, no change in medication DASH diet and commitment to daily physical activity for a minimum of 30 minutes discussed and encouraged, as a part of hypertension management. The importance of attaining a healthy weight is also discussed.     05/10/2022   10:27 AM 05/10/2022    9:50 AM 04/05/2022    8:34 AM 04/05/2022    8:10 AM 02/07/2022    9:46 AM 11/09/2021    8:25 AM 10/24/2021    8:52 AM  BP/Weight  Systolic BP 882 800 349 179 150 569 794  Diastolic BP 72 72 70 76 68 75 64  Wt. (Lbs)  142  141 143.6 138.4 138.2  BMI  25.97 kg/m2  26.21 kg/m2 26.26 kg/m2 25.73 kg/m2 26.11 kg/m2

## 2022-05-14 NOTE — Assessment & Plan Note (Signed)
Improved , however would benefit from therapy but no interest. Depression related to the fact that her husband is an alcoholic , not suicidal or homicidal, has support from family and fiends, no interest in medication, took 2 tabs then stopped

## 2022-05-24 ENCOUNTER — Other Ambulatory Visit: Payer: Self-pay

## 2022-05-24 MED ORDER — AMLODIPINE BESYLATE 2.5 MG PO TABS: 2.5000 mg | ORAL_TABLET | Freq: Every day | ORAL | 3 refills | Status: DC

## 2022-06-21 ENCOUNTER — Encounter: Payer: Medicare HMO | Admitting: Family Medicine

## 2022-07-06 ENCOUNTER — Ambulatory Visit (INDEPENDENT_AMBULATORY_CARE_PROVIDER_SITE_OTHER): Payer: Medicare HMO

## 2022-07-06 ENCOUNTER — Encounter: Payer: Self-pay | Admitting: Radiology

## 2022-07-06 DIAGNOSIS — Z0000 Encounter for general adult medical examination without abnormal findings: Secondary | ICD-10-CM | POA: Diagnosis not present

## 2022-07-06 NOTE — Patient Instructions (Signed)
  Colleen [name] , Thank you for taking time to come for your Medicare Wellness Visit. I appreciate your ongoing commitment to your health goals. Please review the following plan we discussed and let me know if I can assist you in the future.   These are the goals we discussed:  Goals      Increase physical activity     Increase physical activity to 4-5 days per week as able     Patient Stated     Do brain teasers and word searches to help memory and keep brain healthy     Patient Stated     None        This is a list of the screening recommended for you and due dates:  Health Maintenance  Topic Date Due   DTaP/Tdap/Td vaccine (2 - Tdap) 10/21/2013   COVID-19 Vaccine (6 - 2023-24 season) 01/06/2022   Medicare Annual Wellness Visit  07/06/2023   Colon Cancer Screening  02/26/2030   Pneumonia Vaccine  Completed   Flu Shot  Completed   DEXA scan (bone density measurement)  Completed   Hepatitis C Screening: USPSTF Recommendation to screen - Ages 23-79 yo.  Completed   Zoster (Shingles) Vaccine  Completed   HPV Vaccine  Aged Out   Cologuard (Stool DNA test)  Discontinued

## 2022-07-06 NOTE — Progress Notes (Addendum)
Subjective:   Colleen [name] is a 77 y.o. female who presents for Medicare Annual (Subsequent) preventive examination.  Review of Systems    I connected with  RACHELMARIE GAFFKE on 07/06/22 by a audio enabled telemedicine application and verified that I am speaking with the correct person using two identifiers.  Patient Location: Home  Provider Location: Office/Clinic  I discussed the limitations of evaluation and management by telemedicine. The patient expressed understanding and agreed to proceed.        Objective:    There were no vitals filed for this visit. There is no height or weight on file to calculate BMI.     01/27/2021   10:31 AM 04/09/2020   10:30 AM 02/27/2020    9:10 AM 08/20/2017    8:51 AM 11/15/2016    4:32 PM 07/13/2016   10:26 AM 01/21/2015   10:25 AM  Advanced Directives  Does Patient Have a Medical Advance Directive? No No No No No No No  Does patient want to make changes to medical advance directive?    Yes (MAU/Ambulatory/Procedural Areas - Information given)     Would patient like information on creating a medical advance directive? Yes (MAU/Ambulatory/Procedural Areas - Information given) No - Patient declined Yes (MAU/Ambulatory/Procedural Areas - Information given)  No - Patient declined Yes (MAU/Ambulatory/Procedural Areas - Information given) No - patient declined information    Current Medications (verified) Outpatient Encounter Medications as of 07/06/2022  Medication Sig   acetaminophen (TYLENOL) 650 MG CR tablet Take 650 mg by mouth every 8 (eight) hours as needed for pain.    amLODipine (NORVASC) 2.5 MG tablet Take 1 tablet (2.5 mg total) by mouth daily.   Artificial Tear Solution (SOOTHE XP) SOLN Place 1 application into both eyes 3 (three) times daily.    Ascorbic Acid (VITAMIN C) 1000 MG tablet Take 1,000 mg by mouth 2 (two) times a week.    Azelastine HCl (ASTEPRO) 0.15 % SOLN Place 1 spray into the nose daily as needed.   Cholecalciferol  (VITAMIN D3) 125 MCG (5000 UT) CAPS Take 5,000 Units by mouth daily.    Coenzyme Q10 (COQ10) 200 MG CAPS Take 200 mg by mouth daily.   diphenhydrAMINE (BENADRYL) 25 MG tablet Take 25 mg by mouth daily as needed for allergies.    ibuprofen (ADVIL) 200 MG tablet Take 600-800 mg by mouth every 6 (six) hours as needed.   latanoprost (XALATAN) 0.005 % ophthalmic solution Place 1 drop into both eyes at bedtime.   lisinopril-hydrochlorothiazide (ZESTORETIC) 20-25 MG tablet Take 1 tablet by mouth daily.   Multiple Vitamin (MULTIVITAMIN WITH MINERALS) TABS tablet Take 1 tablet by mouth daily.   omeprazole (PRILOSEC) 20 MG capsule Take '20mg'$  30 minutes before a meal once or twice daily as needed.   polyethylene glycol powder (MIRALAX) 17 GM/SCOOP powder Take one capful twice daily until soft bowel movement, then continue once daily as needed to maintain regular bowel movement. (Patient taking differently: Twice a week)   rosuvastatin (CRESTOR) 5 MG tablet TAKE 1 TABLET ON MONDAY, WEDNESDAY, FRIDAY AND SUNDAY IN THE MORNING   UNABLE TO FIND Med Name: TDAP vaccine   No facility-administered encounter medications on file as of 07/06/2022.    Allergies (verified) Aspirin, Fenofibrate, Statins, and Flonase [fluticasone]   History: Past Medical History:  Diagnosis Date   GERD (gastroesophageal reflux disease)    Glaucoma    Hyperlipidemia    Hypertension    Past Surgical History:  Procedure Laterality  Date   CHOLECYSTECTOMY     CHONDROPLASTY Left 12/11/2014   Procedure: CHONDROPLASTY LEFT MEDIAL FEMORAL CONDYLE;  Surgeon: Carole Civil, MD;  Location: [facility];  Service: Orthopedics;  Laterality: Left;   COLONOSCOPY WITH PROPOFOL N/A 02/27/2020   [place]: Fair prep, diverticulosis, nonbleeding internal hemorrhoids.  Consider Next colonoscopy 10 years   KNEE ARTHROSCOPY WITH LATERAL MENISECTOMY Left 12/11/2014   Procedure: KNEE ARTHROSCOPY WITH LATERAL AND MEDIAL MENISECTOMY;  Surgeon: Carole Civil, MD;  Location: [facility];  Service: Orthopedics;  Laterality: Left;   Family History  Problem Relation Age of Onset   Hypertension Mother    Heart disease Mother 66       MI   Hypertension Father    Diabetes Father    Stroke Father    Hypertension Sister    Diabetes Sister    Multiple sclerosis Sister    Hypertension Sister    Diabetes Sister    Heart disease Sister    Kidney disease Sister    Hypertension Brother    Schizophrenia Brother 72   Social History   Socioeconomic History   Marital status: Married    Spouse name: Not on file   Number of children: Not on file   Years of education: Not on file   Highest education level: 12th grade  Occupational History   Not on file  Tobacco Use   Smoking status: Former   Smokeless tobacco: Never  Vaping Use   Vaping Use: Never used  Substance and Sexual Activity   Alcohol use: No   Drug use: No   Sexual activity: Never  Other Topics Concern   Not on file  Social History Narrative   Not on file   Social Determinants of Health   Financial Resource Strain: Low Risk  (04/09/2020)   Overall Financial Resource Strain (CARDIA)    Difficulty of Paying Living Expenses: Not hard at all  Food Insecurity: No Food Insecurity (04/12/2021)   Hunger Vital Sign    Worried About Running Out of Food in the Last Year: Never true    Ran Out of Food in the Last Year: Never true  Transportation Needs: No Transportation Needs (04/12/2021)   PRAPARE - Hydrologist (Medical): No    Lack of Transportation (Non-Medical): No  Physical Activity: Insufficiently Active (04/12/2021)   Exercise Vital Sign    Days of Exercise per Week: 3 days    Minutes of Exercise per Session: 30 min  Stress: No Stress Concern Present (04/09/2020)   [place]    Feeling of Stress : Not at all  Social Connections: Moderately Isolated (04/12/2021)   Social Connection  and Isolation Panel [NHANES]    Frequency of Communication with Friends and Family: Three times a week    Frequency of Social Gatherings with Friends and Family: Three times a week    Attends Religious Services: Never    Active Member of Clubs or Organizations: No    Attends Archivist Meetings: Never    Marital Status: Married    Tobacco Counseling Counseling given: Not Answered   Clinical Intake:  Diabetic?NO  Activities of Daily Living     No data to display           Patient Care Team: Fayrene Helper, MD as PCP - General Domenic Polite Aloha Gell, MD as PCP - Cardiology (Cardiology) Carole Civil, MD as Consulting Physician (Orthopedic Surgery)  Eloise Harman, DO as Consulting Physician (Internal Medicine)  Indicate any recent Medical Services you may have received from other than [facility] providers in the past year (date may be approximate).     Assessment:   This is a routine wellness examination for [place].  Hearing/Vision screen No results found.  Dietary issues and exercise activities discussed:     Goals Addressed   None   Depression Screen    05/10/2022    9:51 AM 04/05/2022    8:11 AM 11/09/2021    8:29 AM 09/13/2021    9:13 AM 09/13/2021    8:44 AM 06/16/2021    8:59 AM 06/07/2021    9:24 AM  PHQ 2/9 Scores  PHQ - 2 Score '4 3 3 6 1 '$ 0 1  PHQ- 9 Score '12 8 9 18       '$ Fall Risk    05/10/2022    9:51 AM 04/05/2022    8:10 AM 09/13/2021    8:44 AM 06/16/2021    8:59 AM 06/07/2021    9:24 AM  Fall Risk   Falls in the past year? 0 0 0 0 0  Number falls in past yr: 0 0 0 0 0  Injury with Fall? 0 0 0 0 0  Risk for fall due to : No Fall Risks  No Fall Risks  No Fall Risks  Follow up Falls evaluation completed  Falls evaluation completed  Falls evaluation completed    FALL RISK PREVENTION PERTAINING TO THE HOME:  Any stairs in or around the home? Yes  If so, are there any without handrails? No  Home free of loose throw rugs in walkways, pet  beds, electrical cords, etc? Yes  Adequate lighting in your home to reduce risk of falls? Yes   ASSISTIVE DEVICES UTILIZED TO PREVENT FALLS:  Life alert? Yes  Use of a cane, walker or w/c? Yes  Grab bars in the bathroom? Yes  Shower chair or bench in shower? No  Elevated toilet seat or a handicapped toilet? No           04/12/2021    9:29 AM 04/09/2020   10:35 AM 08/23/2018    8:21 AM 08/20/2017    8:53 AM 07/13/2016   10:35 AM  6CIT Screen  What Year? 0 points 0 points 0 points 0 points 0 points  What month? 0 points 0 points 0 points 0 points 0 points  What time? 0 points 0 points 0 points 0 points 0 points  Count back from 20 0 points 0 points 0 points 0 points 0 points  Months in reverse 0 points 0 points 0 points 0 points 0 points  Repeat phrase 0 points 0 points 4 points 4 points 0 points  Total Score 0 points 0 points 4 points 4 points 0 points    Immunizations Immunization History  Administered Date(s) Administered   Fluad Quad(high Dose 65+) 01/06/2019, 01/16/2020, 02/11/2021, 01/18/2022   Influenza Split 03/08/2011   Influenza Whole 01/22/2007, 02/04/2009, 01/12/2010   Influenza,inj,Quad PF,6+ Mos 01/29/2013, 03/16/2014, 03/09/2015, 01/07/2016, 01/15/2017, 01/24/2018   Moderna Sars-Covid-2 Vaccination 06/04/2019, 07/02/2019, 03/23/2020, 09/21/2020, 02/06/2021   Pneumococcal Conjugate-13 11/12/2014   Pneumococcal Polysaccharide-23 06/26/2012   Td 10/22/2003   Zoster Recombinat (Shingrix) 10/20/2019, 12/24/2019   Zoster, Live 09/02/2007    TDAP status: Due, Education has been provided regarding the importance of this vaccine. Advised may receive this vaccine at local pharmacy or Health Dept. Aware to provide a copy of the vaccination  record if obtained from local pharmacy or Health Dept. Verbalized acceptance and understanding.  Flu Vaccine status: Up to date  Pneumococcal vaccine status: Up to date  Covid-19 vaccine status: Completed vaccines  Qualifies for  Shingles Vaccine? Yes   Zostavax completed Yes   Shingrix Completed?: Yes  Screening Tests Health Maintenance  Topic Date Due   DTaP/Tdap/Td (2 - Tdap) 10/21/2013   COVID-19 Vaccine (6 - 2023-24 season) 01/06/2022   Medicare Annual Wellness (AWV)  07/06/2023   COLONOSCOPY (Pts 45-28yr Insurance coverage will need to be confirmed)  02/26/2030   Pneumonia Vaccine 77 Years old  Completed   INFLUENZA VACCINE  Completed   DEXA SCAN  Completed   Hepatitis C Screening  Completed   Zoster Vaccines- Shingrix  Completed   HPV VACCINES  Aged Out   Fecal DNA (Cologuard)  Discontinued    Health Maintenance  Health Maintenance Due  Topic Date Due   DTaP/Tdap/Td (2 - Tdap) 10/21/2013   COVID-19 Vaccine (6 - 2023-24 season) 01/06/2022    Colorectal cancer screening: Type of screening: Colonoscopy. Completed 02/27/20. Repeat every 10 years  Mammogram status: No longer required due to AGE.  Bone Density status: Completed 08/12/20. Results reflect: Bone density results: OSTEOPENIA. Repeat every 2 years.  Lung Cancer Screening: (Low Dose CT Chest recommended if Age 76-80years, 30 pack-year currently smoking OR have quit w/in 15years.) does not qualify.   Lung Cancer Screening Referral: NO  Additional Screening:  Hepatitis C Screening: does qualify; Completed 06/26/12  Vision Screening: Recommended annual ophthalmology exams for early detection of glaucoma and other disorders of the eye. Is the patient up to date with their annual eye exam?  Yes  Who is the provider or what is the name of the office in which the patient attends annual eye exams? Dr CJorja LoaIf pt is not established with a provider, would they like to be referred to a provider to establish care? No .   Dental Screening: Recommended annual dental exams for proper oral hygiene  Community Resource Referral / Chronic Care Management: CRR required this visit?  No   CCM required this visit?  No      Plan:     I have  personally reviewed and noted the following in the patient's chart:   Medical and social history Use of alcohol, tobacco or illicit drugs  Current medications and supplements including opioid prescriptions. Patient is not currently taking opioid prescriptions. Functional ability and status Nutritional status Physical activity Advanced directives List of other physicians Hospitalizations, surgeries, and ER visits in previous 12 months Vitals Screenings to include cognitive, depression, and falls Referrals and appointments  In addition, I have reviewed and discussed with patient certain preventive protocols, quality metrics, and best practice recommendations. A written personalized care plan for preventive services as well as general preventive health recommendations were provided to patient.     KQuentin Angst [place]  07/06/2022

## 2022-09-08 ENCOUNTER — Encounter: Payer: Self-pay | Admitting: Family Medicine

## 2022-09-08 ENCOUNTER — Ambulatory Visit (INDEPENDENT_AMBULATORY_CARE_PROVIDER_SITE_OTHER): Payer: Medicare HMO | Admitting: Family Medicine

## 2022-09-08 VITALS — BP 128/70 | HR 69 | Ht 62.0 in | Wt 139.0 lb

## 2022-09-08 DIAGNOSIS — Z1211 Encounter for screening for malignant neoplasm of colon: Secondary | ICD-10-CM | POA: Diagnosis not present

## 2022-09-08 DIAGNOSIS — Z1231 Encounter for screening mammogram for malignant neoplasm of breast: Secondary | ICD-10-CM | POA: Diagnosis not present

## 2022-09-08 DIAGNOSIS — Z0000 Encounter for general adult medical examination without abnormal findings: Secondary | ICD-10-CM

## 2022-09-08 DIAGNOSIS — E785 Hyperlipidemia, unspecified: Secondary | ICD-10-CM

## 2022-09-08 DIAGNOSIS — E559 Vitamin D deficiency, unspecified: Secondary | ICD-10-CM

## 2022-09-08 DIAGNOSIS — I10 Essential (primary) hypertension: Secondary | ICD-10-CM | POA: Diagnosis not present

## 2022-09-08 NOTE — Progress Notes (Unsigned)
    Colleen [name]     MRN: [id number]      DOB: 11-06-1945  Chief Complaint  Patient presents with   Annual Exam   3 month h/o numbness in fingers of right hand and left toes  get numb and tingle also HPI: Patient is in for annual physical exam. Recent labs,  are reviewed. Immunization is reviewed , and  updated if needed.   PE: BP 128/70   Pulse 69   Ht 5\' 2"  (1.575 m)   Wt 139 lb 0.6 oz (63.1 kg)   SpO2 94%   BMI 25.43 kg/m   Pleasant  female, alert and oriented x 3, in no cardio-pulmonary distress. Afebrile. HEENT No facial trauma or asymetry. Sinuses non tender.  Extra occullar muscles intact.. External ears normal, . Neck: supple, no adenopathy,JVD or thyromegaly.No bruits.  Chest: Clear to ascultation bilaterally.No crackles or wheezes. Non tender to palpation    Cardiovascular system; Heart sounds normal,  S1 and  S2 ,no S3.  No murmur, or thrill. Apical beat not displaced Peripheral pulses normal.  Abdomen: Soft, non tender, no organomegaly or masses.  Musculoskeletal exam: Full ROM of spine, hips , shoulders and knees. No deformity ,swelling or crepitus noted. No muscle wasting or atrophy.   Neurologic: Cranial nerves 2 to 12 intact. Power, tone ,sensation and reflexes normal throughout. No disturbance in gait. No tremor.  Skin: Intact, no ulceration, erythema , scaling or rash noted. Pigmentation normal throughout  Psych; Normal mood and affect. Judgement and concentration normal   Assessment & Plan:  Annual visit for general adult medical examination without abnormal findings Annual exam as documented. Immunization and cancer screening needs are specifically addressed at this visit.

## 2022-09-08 NOTE — Patient Instructions (Addendum)
F/U in Octobbber, call if you need me sooner  Fasting lipid, cmp and EGFr, CBc and vit D first week I July  Pls schedule mammogram at checkout  Nurse pls arrange cologuard  Nurse plss get TdAP DOCUMENTATION FROM [employer]  AND ENTER  NEED RSV  It is important that you exercise regularly at least 30 minutes 5 times a week. If you develop chest pain, have severe difficulty breathing, or feel very tired, stop exercising immediately and seek medical attention   Think about what you will eat, plan ahead. Choose " clean, green, fresh or frozen" over canned, processed or packaged foods which are more sugary, salty and fatty. 70 to 75% of food eaten should be vegetables and fruit. Three meals at set times with snacks allowed between meals, but they must be fruit or vegetables. Aim to eat over a 12 hour period , example 7 am to 7 pm, and STOP after  your last meal of the day. Drink water,generally about 64 ounces per day, no other drink is as healthy. Fruit juice is best enjoyed in a healthy way, by EATING the fruit. Thanks for choosing [facility] we consider it a privelige to serve you.

## 2022-09-10 ENCOUNTER — Encounter: Payer: Self-pay | Admitting: Family Medicine

## 2022-09-10 DIAGNOSIS — Z0000 Encounter for general adult medical examination without abnormal findings: Secondary | ICD-10-CM | POA: Insufficient documentation

## 2022-09-10 NOTE — Assessment & Plan Note (Signed)
Annual exam as documented. . Immunization and cancer screening needs are specifically addressed at this visit.  

## 2022-09-22 DIAGNOSIS — Z1211 Encounter for screening for malignant neoplasm of colon: Secondary | ICD-10-CM | POA: Diagnosis not present

## 2022-09-29 LAB — COLOGUARD: COLOGUARD: NEGATIVE

## 2022-10-11 ENCOUNTER — Other Ambulatory Visit: Payer: Self-pay | Admitting: Family Medicine

## 2022-11-20 DIAGNOSIS — H01002 Unspecified blepharitis right lower eyelid: Secondary | ICD-10-CM | POA: Diagnosis not present

## 2022-11-20 DIAGNOSIS — H01001 Unspecified blepharitis right upper eyelid: Secondary | ICD-10-CM | POA: Diagnosis not present

## 2022-11-20 DIAGNOSIS — H2513 Age-related nuclear cataract, bilateral: Secondary | ICD-10-CM | POA: Diagnosis not present

## 2022-11-20 DIAGNOSIS — H40053 Ocular hypertension, bilateral: Secondary | ICD-10-CM | POA: Diagnosis not present

## 2023-01-11 ENCOUNTER — Ambulatory Visit (HOSPITAL_COMMUNITY)
Admission: RE | Admit: 2023-01-11 | Discharge: 2023-01-11 | Disposition: A | Discharge status: Home / Self Care | Payer: Medicare HMO | Source: Ambulatory Visit | Attending: Family Medicine | Admitting: Family Medicine

## 2023-01-11 ENCOUNTER — Encounter (HOSPITAL_COMMUNITY): Payer: Self-pay

## 2023-01-11 DIAGNOSIS — Z1231 Encounter for screening mammogram for malignant neoplasm of breast: Secondary | ICD-10-CM | POA: Insufficient documentation

## 2023-01-31 ENCOUNTER — Telehealth: Payer: Self-pay | Admitting: Family Medicine

## 2023-01-31 NOTE — Telephone Encounter (Signed)
Patient received a call from nurse, she returning a call.

## 2023-02-02 NOTE — Telephone Encounter (Signed)
I'm not seeing where anyone called pt. Must have been care coordination calling about overdue caregaps

## 2023-02-07 DIAGNOSIS — E785 Hyperlipidemia, unspecified: Secondary | ICD-10-CM | POA: Diagnosis not present

## 2023-02-07 DIAGNOSIS — E559 Vitamin D deficiency, unspecified: Secondary | ICD-10-CM | POA: Diagnosis not present

## 2023-02-07 DIAGNOSIS — I10 Essential (primary) hypertension: Secondary | ICD-10-CM | POA: Diagnosis not present

## 2023-02-08 LAB — CMP14+EGFR
ALT: 20 [IU]/L (ref 0–32)
AST: 21 [IU]/L (ref 0–40)
Albumin: 4.3 g/dL (ref 3.8–4.8)
Alkaline Phosphatase: 100 [IU]/L (ref 44–121)
BUN/Creatinine Ratio: 17 (ref 12–28)
BUN: 15 mg/dL (ref 8–27)
Bilirubin Total: 0.4 mg/dL (ref 0.0–1.2)
CO2: 25 mmol/L (ref 20–29)
Calcium: 9.4 mg/dL (ref 8.7–10.3)
Chloride: 105 mmol/L (ref 96–106)
Creatinine, Ser: 0.9 mg/dL (ref 0.57–1.00)
Globulin, Total: 2.6 g/dL (ref 1.5–4.5)
Glucose: 94 mg/dL (ref 70–99)
Potassium: 4.3 mmol/L (ref 3.5–5.2)
Sodium: 141 mmol/L (ref 134–144)
Total Protein: 6.9 g/dL (ref 6.0–8.5)
eGFR: 66 mL/min/{1.73_m2} (ref >59)

## 2023-02-08 LAB — CBC
Hematocrit: 38.1 % (ref 34.0–46.6)
Hemoglobin: 12.5 g/dL (ref 11.1–15.9)
MCH: 30.4 pg (ref 26.6–33.0)
MCHC: 32.8 g/dL (ref 31.5–35.7)
MCV: 93 fL (ref 79–97)
Platelets: 252 10*3/uL (ref 150–450)
RBC: 4.11 x10E6/uL (ref 3.77–5.28)
RDW: 12.8 % (ref 11.7–15.4)
WBC: 4.3 10*3/uL (ref 3.4–10.8)

## 2023-02-08 LAB — LIPID PANEL
Chol/HDL Ratio: 3.4 {ratio} (ref 0.0–4.4)
Cholesterol, Total: 176 mg/dL (ref 100–199)
HDL: 52 mg/dL (ref >39)
LDL Chol Calc (NIH): 110 mg/dL — ABNORMAL HIGH (ref 0–99)
Triglycerides: 72 mg/dL (ref 0–149)
VLDL Cholesterol Cal: 14 mg/dL (ref 5–40)

## 2023-02-08 LAB — VITAMIN D 25 HYDROXY (VIT D DEFICIENCY, FRACTURES): Vit D, 25-Hydroxy: 42.8 ng/mL (ref 30.0–100.0)

## 2023-02-14 ENCOUNTER — Ambulatory Visit (INDEPENDENT_AMBULATORY_CARE_PROVIDER_SITE_OTHER): Payer: Medicare HMO | Admitting: Family Medicine

## 2023-02-14 ENCOUNTER — Encounter: Payer: Self-pay | Admitting: Family Medicine

## 2023-02-14 VITALS — BP 121/65 | HR 68 | Ht 62.0 in | Wt 137.1 lb

## 2023-02-14 DIAGNOSIS — Z23 Encounter for immunization: Secondary | ICD-10-CM

## 2023-02-14 DIAGNOSIS — K219 Gastro-esophageal reflux disease without esophagitis: Secondary | ICD-10-CM

## 2023-02-14 DIAGNOSIS — E782 Mixed hyperlipidemia: Secondary | ICD-10-CM

## 2023-02-14 DIAGNOSIS — E663 Overweight: Secondary | ICD-10-CM | POA: Diagnosis not present

## 2023-02-14 DIAGNOSIS — G5601 Carpal tunnel syndrome, right upper limb: Secondary | ICD-10-CM

## 2023-02-14 DIAGNOSIS — I10 Essential (primary) hypertension: Secondary | ICD-10-CM | POA: Diagnosis not present

## 2023-02-14 MED ORDER — OMEPRAZOLE 20 MG PO CPDR: 20.0000 mg | DELAYED_RELEASE_CAPSULE | Freq: Every day | ORAL | 3 refills | Status: DC

## 2023-02-14 NOTE — Patient Instructions (Addendum)
F/U in mid March, call if you need me sooner  Flu  vaccine today  Nurse pls check,TdAP , pt thinks she had one  [facility]) if not please let her know  Get Covid vaccine in next 1 to 2 weeks  Prilosec 20 mg is prescribed for once daily , as this is what you need  Reduce fried and fatty foods please bad cholesterol is above goal  It is important that you exercise regularly at least 30 minutes 5 times a week. If you develop chest pain, have severe difficulty breathing, or feel very tired, stop exercising immediately and seek medical attention   You are referred to Dr Romeo Apple re right carpal tunnel syndrome  Best for 2025!!  Thanks for choosing [facility], we consider it a privelige to serve you.

## 2023-02-14 NOTE — Progress Notes (Signed)
Colleen [name]     MRN: [id number]      DOB: 18-Apr-1946  Chief Complaint  Patient presents with   Follow-up    Follow up  cramps in ankles and R hand goes numb    HPI Colleen [name] is here for follow up and re-evaluation of chronic medical conditions, medication management and review of any available recent lab and radiology data.  Preventive health is updated, specifically  Cancer screening and Immunization.   Questions or concerns regarding consultations or procedures which the PT has had in the interim are  addressed. The PT denies any adverse reactions to current medications since the last visit.  C/o increased right hand pain and mumbness which awakens her also cramps  in right ankle ROS Denies recent fever or chills. Denies sinus pressure, nasal congestion, ear pain or sore throat. Denies chest congestion, productive cough or wheezing. Denies chest pains, palpitations and leg swelling Denies abdominal pain, nausea, vomiting,diarrhea or constipation.   Denies dysuria, frequency, hesitancy or incontinence.  Denies depression, anxiety or insomnia. Denies skin break down or rash.   PE  BP 121/65 (BP Location: Right Arm, Patient Position: Sitting, Cuff Size: Large)   Pulse 68   Ht 5\' 2"  (1.575 m)   Wt 137 lb 1.9 oz (62.2 kg)   SpO2 96%   BMI 25.08 kg/m   Patient alert and oriented and in no cardiopulmonary distress.  HEENT: No facial asymmetry, EOMI,     Neck supple .  Chest: Clear to auscultation bilaterally.  CVS: S1, S2 no murmurs, no S3.Regular rate.  ABD: Soft non tender.   Ext: No edema  MS: Adequate ROM spine, shoulders, hips and knees.Tinnel's positive with ritght thenar wasting  Skin: Intact, no ulcerations or rash noted.  Psych: Good eye contact, normal affect. Memory intact not anxious or depressed appearing.  CNS: CN 2-12 intact, power,  normal throughout.no focal deficits noted.   Assessment & Plan  Right carpal tunnel syndrome 3 month history,  worsening and mild weakness of hand, refer Ortho  Essential hypertension Controlled, no change in medication DASH diet and commitment to daily physical activity for a minimum of 30 minutes discussed and encouraged, as a part of hypertension management. The importance of attaining a healthy weight is also discussed.     02/14/2023    8:55 AM 09/08/2022   11:05 AM 09/08/2022   10:22 AM 05/10/2022   10:27 AM 05/10/2022    9:50 AM 04/05/2022    8:34 AM 04/05/2022    8:10 AM  BP/Weight  Systolic BP 121 128 135 130 131 140 151  Diastolic BP 65 70 76 72 72 70 76  Wt. (Lbs) 137.12  139.04  142  141  BMI 25.08 kg/m2  25.43 kg/m2  25.97 kg/m2  26.21 kg/m2       Hyperlipidemia Hyperlipidemia:Low fat diet discussed and encouraged.   Lipid Panel  Lab Results  Component Value Date   CHOL 176 02/07/2023   HDL 52 02/07/2023   LDLCALC 110 (H) 02/07/2023   TRIG 72 02/07/2023   CHOLHDL 3.4 02/07/2023     UnControlled, no change in medication work on diet   Overweight (BMI 25.0-29.9)  Patient re-educated about  the importance of commitment to a  minimum of 150 minutes of exercise per week as able.  The importance of healthy food choices with portion control discussed, as well as eating regularly and within a 12 hour window most days. The need to choose "clean ,  green" food 50 to 75% of the time is discussed, as well as to make water the primary drink and set a goal of 64 ounces water daily.       02/14/2023    8:55 AM 09/08/2022   10:22 AM 05/10/2022    9:50 AM  Weight /BMI  Weight 137 lb 1.9 oz 139 lb 0.6 oz 142 lb  Height 5\' 2"  (1.575 m) 5\' 2"  (1.575 m) 5\' 2"  (1.575 m)  BMI 25.08 kg/m2 25.43 kg/m2 25.97 kg/m2    Improvement gradually , good  Immunization due After obtaining informed consent, the vaccine is  administered , with no adverse effect noted at the time of administration.   GERD Controlled on lower dose prilosec will change rx to reflect actual effective dose

## 2023-02-14 NOTE — Assessment & Plan Note (Addendum)
3 month history, worsening and mild weakness of hand, refer Ortho

## 2023-02-18 DIAGNOSIS — Z23 Encounter for immunization: Secondary | ICD-10-CM | POA: Insufficient documentation

## 2023-02-18 NOTE — Assessment & Plan Note (Signed)
Controlled on lower dose prilosec will change rx to reflect actual effective dose

## 2023-02-18 NOTE — Assessment & Plan Note (Signed)
Hyperlipidemia:Low fat diet discussed and encouraged.   Lipid Panel  Lab Results  Component Value Date   CHOL 176 02/07/2023   HDL 52 02/07/2023   LDLCALC 110 (H) 02/07/2023   TRIG 72 02/07/2023   CHOLHDL 3.4 02/07/2023     UnControlled, no change in medication work on diet

## 2023-02-18 NOTE — Assessment & Plan Note (Signed)
Controlled, no change in medication DASH diet and commitment to daily physical activity for a minimum of 30 minutes discussed and encouraged, as a part of hypertension management. The importance of attaining a healthy weight is also discussed.     02/14/2023    8:55 AM 09/08/2022   11:05 AM 09/08/2022   10:22 AM 05/10/2022   10:27 AM 05/10/2022    9:50 AM 04/05/2022    8:34 AM 04/05/2022    8:10 AM  BP/Weight  Systolic BP 121 128 135 130 131 140 151  Diastolic BP 65 70 76 72 72 70 76  Wt. (Lbs) 137.12  139.04  142  141  BMI 25.08 kg/m2  25.43 kg/m2  25.97 kg/m2  26.21 kg/m2

## 2023-02-18 NOTE — Assessment & Plan Note (Signed)
After obtaining informed consent, the vaccine is  administered , with no adverse effect noted at the time of administration.  

## 2023-02-18 NOTE — Assessment & Plan Note (Signed)
  Patient re-educated about  the importance of commitment to a  minimum of 150 minutes of exercise per week as able.  The importance of healthy food choices with portion control discussed, as well as eating regularly and within a 12 hour window most days. The need to choose "clean , green" food 50 to 75% of the time is discussed, as well as to make water the primary drink and set a goal of 64 ounces water daily.       02/14/2023    8:55 AM 09/08/2022   10:22 AM 05/10/2022    9:50 AM  Weight /BMI  Weight 137 lb 1.9 oz 139 lb 0.6 oz 142 lb  Height 5\' 2"  (1.575 m) 5\' 2"  (1.575 m) 5\' 2"  (1.575 m)  BMI 25.08 kg/m2 25.43 kg/m2 25.97 kg/m2    Improvement gradually , good

## 2023-02-26 ENCOUNTER — Ambulatory Visit: Payer: Medicare HMO | Admitting: Orthopedic Surgery

## 2023-02-26 ENCOUNTER — Encounter: Payer: Self-pay | Admitting: Orthopedic Surgery

## 2023-02-26 VITALS — BP 121/65 | Ht 62.0 in | Wt 139.0 lb

## 2023-02-26 DIAGNOSIS — R202 Paresthesia of skin: Secondary | ICD-10-CM

## 2023-02-26 DIAGNOSIS — G5601 Carpal tunnel syndrome, right upper limb: Secondary | ICD-10-CM | POA: Diagnosis not present

## 2023-02-26 NOTE — Progress Notes (Signed)
Office Visit Note   Patient: Colleen [name]           Date of Birth: [birth year]           MRN: [id number] Visit Date: 02/26/2023 Requested by: Kerri Perches, MD [address], [address] [place],  [place] [address] PCP: Kerri Perches, MD   Assessment & Plan:   Encounter Diagnoses  Name Primary?   Right hand paresthesia Yes   Carpal tunnel syndrome of right wrist     No orders of the defined types were placed in this encounter.   She probably has carpal tunnel syndrome recommend bracing and carpal tunnel testing  Return for results   Office Visit Note   Patient: Colleen [name]           Date of Birth: 06-07-1945           MRN: [id number] Visit Date: 02/26/2023 Requested by: Kerri Perches, MD [address] [place],  [place] [address] PCP: Kerri Perches, MD    PLAN:   Wrist splint  YES Gabapentin NO Vit B 6 NO NCS YES     Follow-Up Instructions: Return for in office follow up for nerve study report, possible surgery scheduling of carpal tunnel release.   Orders:  No orders of the defined types were placed in this encounter.     Objective: Vital Signs: BP 121/65 Comment: 02/14/23  Ht 5\' 2"  (1.575 m)   Wt 139 lb (63 kg)   BMI 25.42 kg/m    HPI  77 year old female 67-month history of numbness of the right hand with weakness decreased sensation in the fingers and increased pain at night.  Pain increases with writing activities  No prior treatment     Allergies  Allergen Reactions   Aspirin Nausea Only    Upset stomach (higher doses)   Fenofibrate Other (See Comments)    Muscle aches   Statins Other (See Comments)    Muscle cramps   Flonase [Fluticasone] Other (See Comments)    Caused increased pressure in the eye  '  Current Outpatient Medications  Medication Instructions   acetaminophen (TYLENOL) 650 mg, Oral, Every 8 hours PRN   amLODipine (NORVASC) 2.5 mg, Oral, Daily   Artificial Tear Solution (SOOTHE XP)  SOLN 1 application , Both Eyes, 3 times daily   CoQ10 200 mg, Oral, Daily   diphenhydrAMINE (BENADRYL) 25 mg, Oral, Daily PRN   ibuprofen (ADVIL) 600-800 mg, Oral, Every 6 hours PRN   latanoprost (XALATAN) 0.005 % ophthalmic solution 1 drop, Both Eyes, Daily at bedtime   lisinopril-hydrochlorothiazide (ZESTORETIC) 20-25 MG tablet 1 tablet, Oral, Daily   Multiple Vitamin (MULTIVITAMIN WITH MINERALS) TABS tablet 1 tablet, Oral, Daily   omeprazole (PRILOSEC) 20 mg, Oral, Daily   polyethylene glycol powder (MIRALAX) 17 GM/SCOOP powder Take one capful twice daily until soft bowel movement, then continue once daily as needed to maintain regular bowel movement.   rosuvastatin (CRESTOR) 5 MG tablet TAKE 1 TABLET ON MONDAY, WEDNESDAY, FRIDAY AND SUNDAY IN THE MORNING   vitamin C 1,000 mg, Oral, 2 times weekly   Vitamin D3 5,000 Units, Oral, Daily    Review of Systems Review of Systems Shortness of breath no chest pain no easy bruising no anesthetic problems no  Past Medical History:  Diagnosis Date   GERD (gastroesophageal reflux disease)    Glaucoma    Hyperlipidemia    Hypertension     Past Surgical History:  Procedure Laterality Date   CHOLECYSTECTOMY     CHONDROPLASTY Left 12/11/2014   Procedure: CHONDROPLASTY LEFT MEDIAL FEMORAL CONDYLE;  Surgeon: Vickki Hearing, MD;  Location: [facility];  Service: Orthopedics;  Laterality: Left;   COLONOSCOPY WITH PROPOFOL N/A 02/27/2020   Carver: Fair prep, diverticulosis, nonbleeding internal hemorrhoids.  Consider Next colonoscopy 10 years   KNEE ARTHROSCOPY WITH LATERAL MENISECTOMY Left 12/11/2014   Procedure: KNEE ARTHROSCOPY WITH LATERAL AND MEDIAL MENISECTOMY;  Surgeon: Vickki Hearing, MD;  Location: [facility];  Service: Orthopedics;  Laterality: Left;    Family History  Problem Relation Age of Onset   Hypertension Mother    Heart disease Mother 47       MI   Hypertension Father    Diabetes Father    Stroke Father    Hypertension Sister     Diabetes Sister    Multiple sclerosis Sister    Hypertension Sister    Diabetes Sister    Heart disease Sister    Kidney disease Sister    Hypertension Brother    Schizophrenia Brother 62     Social History   Tobacco Use   Smoking status: Former   Smokeless tobacco: Never  Advertising account planner   Vaping status: Never Used  Substance Use Topics   Alcohol use: No   Drug use: No    Allergies  Allergen Reactions   Aspirin Nausea Only    Upset stomach (higher doses)   Fenofibrate Other (See Comments)    Muscle aches   Statins Other (See Comments)    Muscle cramps   Flonase [Fluticasone] Other (See Comments)    Caused increased pressure in the eye    Current Outpatient Medications  Medication Sig Dispense Refill   acetaminophen (TYLENOL) 650 MG CR tablet Take 650 mg by mouth every 8 (eight) hours as needed for pain.      amLODipine (NORVASC) 2.5 MG tablet Take 1 tablet (2.5 mg total) by mouth daily. 90 tablet 3   Artificial Tear Solution (SOOTHE XP) SOLN Place 1 application into both eyes 3 (three) times daily.      Ascorbic Acid (VITAMIN C) 1000 MG tablet Take 1,000 mg by mouth 2 (two) times a week.      Cholecalciferol (VITAMIN D3) 125 MCG (5000 UT) CAPS Take 5,000 Units by mouth daily.      Coenzyme Q10 (COQ10) 200 MG CAPS Take 200 mg by mouth daily.     diphenhydrAMINE (BENADRYL) 25 MG tablet Take 25 mg by mouth daily as needed for allergies.      ibuprofen (ADVIL) 200 MG tablet Take 600-800 mg by mouth every 6 (six) hours as needed.     latanoprost (XALATAN) 0.005 % ophthalmic solution Place 1 drop into both eyes at bedtime.  12   lisinopril-hydrochlorothiazide (ZESTORETIC) 20-25 MG tablet TAKE 1 TABLET EVERY DAY 90 tablet 3   Multiple Vitamin (MULTIVITAMIN WITH MINERALS) TABS tablet Take 1 tablet by mouth daily.     omeprazole (PRILOSEC) 20 MG capsule Take 1 capsule (20 mg total) by mouth daily. 90 capsule 3   polyethylene glycol powder (MIRALAX) 17 GM/SCOOP powder Take one  capful twice daily until soft bowel movement, then continue once daily as needed to maintain regular bowel movement. (Patient taking differently: Twice a week) 527 g 5   rosuvastatin (CRESTOR) 5 MG tablet TAKE 1 TABLET ON MONDAY, WEDNESDAY, FRIDAY AND SUNDAY IN THE MORNING 48 tablet 10   No current facility-administered medications for this visit.  Physical Exam BP 121/65 Comment: 02/14/23  Ht 5\' 2"  (1.575 m)   Wt 139 lb (63 kg)   BMI 25.42 kg/m   The patient is well developed well nourished and well groomed.  Orientation to person place and time is normal  Mood is pleasant.  Ambulatory status normal gait and stance   Right upper extremity examination reveals the following:  SWELLING no TENDERNESS OVER THE CARPAL TUNNEL yes  Range of motion of the wrist normal  Motor exam normal  Wrist joint is stable  Provocative tests for carpal tunnel Phalen's test positive Carpal tunnel compression test positive Tinel's test positive  Pulses are normal in the radial and ulnar artery with a normal Allen's test.  SENSORY EXAM:   Opposite extremity normal    Visit Diagnoses:  1. Right hand paresthesia   2. Carpal tunnel syndrome of right wrist

## 2023-02-26 NOTE — Patient Instructions (Signed)
We are referring you to [facility] from [facility] address is [address] [place] The phone number is [phone]  The office will call you with an appointment Dr. Alvester Morin   Call us to make appointment for about a week after the study

## 2023-03-06 ENCOUNTER — Ambulatory Visit: Payer: Medicare HMO | Admitting: Physical Medicine and Rehabilitation

## 2023-03-06 DIAGNOSIS — M25511 Pain in right shoulder: Secondary | ICD-10-CM | POA: Diagnosis not present

## 2023-03-06 DIAGNOSIS — G8929 Other chronic pain: Secondary | ICD-10-CM | POA: Diagnosis not present

## 2023-03-06 DIAGNOSIS — M542 Cervicalgia: Secondary | ICD-10-CM

## 2023-03-06 DIAGNOSIS — R29898 Other symptoms and signs involving the musculoskeletal system: Secondary | ICD-10-CM

## 2023-03-06 DIAGNOSIS — R202 Paresthesia of skin: Secondary | ICD-10-CM

## 2023-03-06 NOTE — Progress Notes (Unsigned)
Functional Pain Scale - descriptive words and definitions  Mild (2)   Noticeable when not distracted/no impact on ADL's/sleep only slightly affected and able to   use both passive and active distraction for comfort. Mild range order  Average Pain 1  RUE NCS, pain, numbness, tingling in right arm.  Hard to write, no difficulty grasping. Pain can be up to a 10.

## 2023-03-06 NOTE — Progress Notes (Unsigned)
CHRISANN RADKA - 77 y.o. female MRN [id number]  Date of birth: 12/23/1945  Office Visit Note: Visit Date: 03/06/2023 PCP: Kerri Perches, MD Referred by: Vickki Hearing, MD  Subjective: Chief Complaint  Patient presents with   Right Arm - Pain, Numbness, Tingling   HPI: Colleen [name] is a 77 y.o. female who comes in todayHPI    ROS Otherwise per HPI.  Assessment & Plan: Visit Diagnoses:    ICD-10-CM   1. Paresthesia of skin  R20.2 NCV with EMG (electromyography)       Plan: Impression: The above electrodiagnostic study is ABNORMAL and reveals evidence of a severe right median nerve entrapment at the wrist (carpal tunnel syndrome) affecting sensory and motor components.   There is no significant electrodiagnostic evidence of any other focal nerve entrapment, brachial plexopathy or cervical radiculopathy.   Recommendations: 1.  Follow-up with referring physician. 2.  Continue current management of symptoms. 3.  Suggest surgical evaluation.  Meds & Orders: No orders of the defined types were placed in this encounter.   Orders Placed This Encounter  Procedures   NCV with EMG (electromyography)    Follow-up: No follow-ups on file.   Procedures: No procedures performed  EMG & NCV Findings: Evaluation of the right median motor nerve showed prolonged distal onset latency (5.8 ms), reduced amplitude (4.9 mV), and decreased conduction velocity (Elbow-Wrist, 47 m/s).  The right median (across palm) sensory nerve showed prolonged distal peak latency (Wrist, 7.6 ms), reduced amplitude (8.0 V), and prolonged distal peak latency (Palm, 2.9 ms).  All remaining nerves (as indicated in the following tables) were within normal limits.    All examined muscles (as indicated in the following table) showed no evidence of electrical instability.    Impression: The above electrodiagnostic study is ABNORMAL and reveals evidence of a severe right median nerve entrapment at the  wrist (carpal tunnel syndrome) affecting sensory and motor components.   There is no significant electrodiagnostic evidence of any other focal nerve entrapment, brachial plexopathy or cervical radiculopathy.   Recommendations: 1.  Follow-up with referring physician. 2.  Continue current management of symptoms. 3.  Suggest surgical evaluation.  ___________________________ Naaman Plummer FAAPMR Board Certified, American Board of Physical Medicine and Rehabilitation    Nerve Conduction Studies Anti Sensory Summary Table   Stim Site NR Peak (ms) Norm Peak (ms) P-T Amp (V) Norm P-T Amp Site1 Site2 Delta-P (ms) Dist (cm) Vel (m/s) Norm Vel (m/s)  Right Median Acr Palm Anti Sensory (2nd Digit)  31.5C  Wrist    *7.6 <3.6 *8.0 >10 Wrist Palm 4.7 0.0    Palm    *2.9 <2.0 13.3         Right Radial Anti Sensory (Base 1st Digit)  30.1C  Wrist    2.1 <3.1 29.5  Wrist Base 1st Digit 2.1 0.0    Right Ulnar Anti Sensory (5th Digit)  30.7C  Wrist    3.6 <3.7 24.9 >15.0 Wrist 5th Digit 3.6 14.0 39 >38   Motor Summary Table   Stim Site NR Onset (ms) Norm Onset (ms) O-P Amp (mV) Norm O-P Amp Site1 Site2 Delta-0 (ms) Dist (cm) Vel (m/s) Norm Vel (m/s)  Right Median Motor (Abd Poll Brev)  29.9C  Wrist    *5.8 <4.2 *4.9 >5 Elbow Wrist 4.4 20.5 *47 >50  Elbow    10.2  1.1         Right Ulnar Motor (Abd Dig Min)  29.9C  Wrist  3.1 <4.2 10.8 >3 B Elbow Wrist 2.5 18.0 72 >53  B Elbow    5.6  7.7  A Elbow B Elbow 1.6 10.0 62 >53  A Elbow    7.2  9.7          EMG   Side Muscle Nerve Root Ins Act Fibs Psw Amp Dur Poly Recrt Int Dennie Bible Comment  Right Abd Poll Brev Median C8-T1 Nml Nml Nml Nml Nml 0 Nml Nml   Right 1stDorInt Ulnar C8-T1 Nml Nml Nml Nml Nml 0 Nml Nml   Right PronatorTeres Median C6-7 Nml Nml Nml Nml Nml 0 Nml Nml   Right Biceps Musculocut C5-6 Nml Nml Nml Nml Nml 0 Nml Nml   Right Deltoid Axillary C5-6 Nml Nml Nml Nml Nml 0 Nml Nml     Nerve Conduction Studies Anti Sensory  Left/Right Comparison   Stim Site L Lat (ms) R Lat (ms) L-R Lat (ms) L Amp (V) R Amp (V) L-R Amp (%) Site1 Site2 L Vel (m/s) R Vel (m/s) L-R Vel (m/s)  Median Acr Palm Anti Sensory (2nd Digit)  31.5C  Wrist  *7.6   *8.0  Wrist Palm     Palm  *2.9   13.3        Radial Anti Sensory (Base 1st Digit)  30.1C  Wrist  2.1   29.5  Wrist Base 1st Digit     Ulnar Anti Sensory (5th Digit)  30.7C  Wrist  3.6   24.9  Wrist 5th Digit  39    Motor Left/Right Comparison   Stim Site L Lat (ms) R Lat (ms) L-R Lat (ms) L Amp (mV) R Amp (mV) L-R Amp (%) Site1 Site2 L Vel (m/s) R Vel (m/s) L-R Vel (m/s)  Median Motor (Abd Poll Brev)  29.9C  Wrist  *5.8   *4.9  Elbow Wrist  *47   Elbow  10.2   1.1        Ulnar Motor (Abd Dig Min)  29.9C  Wrist  3.1   10.8  B Elbow Wrist  72   B Elbow  5.6   7.7  A Elbow B Elbow  62   A Elbow  7.2   9.7           Waveforms:             Clinical History: No specialty comments available.   She reports that she has quit smoking. She has never used smokeless tobacco. No results for input(s): "HGBA1C", "LABURIC" in the last 8760 hours.  Objective:  VS:  HT:    WT:   BMI:     BP:   HR: bpm  TEMP: ( )  RESP:  Physical Exam  Ortho Exam  Imaging: No results found.  Past Medical/Family/Surgical/Social History: Medications & Allergies reviewed per EMR, new medications updated. Patient Active Problem List   Diagnosis Date Noted   Immunization due 02/18/2023   Right carpal tunnel syndrome 02/14/2023   Annual visit for general adult medical examination without abnormal findings 09/10/2022   Abnormal urine odor 04/10/2022   DOE (dyspnea on exertion) 10/24/2021   Myalgia due to statin 10/24/2021   Heart murmur, systolic 10/24/2021   Left shoulder pain 09/13/2021   Depression, major, single episode, severe (HCC) 09/13/2021   Arthritis, shoulder region right 07/11/2021   Cervical disc disease 07/11/2021   Snoring 06/16/2021   Fatigue 06/16/2021    Chronic cough 06/16/2021   Constipation 08/23/2020   Overweight (BMI 25.0-29.9) 11/20/2018  Abnormal finding on EKG 03/16/2018   Neck pain on right side 03/11/2018   Hip pain, right 03/11/2018   Back pain with radiation 03/11/2018   Primary osteoarthritis of left knee    Left knee pain 11/15/2014   IFG (impaired fasting glucose) 10/13/2013   ALLERGIC RHINITIS, SEASONAL 03/30/2008   Hyperlipidemia 09/17/2007   Essential hypertension 09/17/2007   GERD 09/17/2007   Osteopenia 09/17/2007   Past Medical History:  Diagnosis Date   GERD (gastroesophageal reflux disease)    Glaucoma    Hyperlipidemia    Hypertension    Family History  Problem Relation Age of Onset   Hypertension Mother    Heart disease Mother 44       MI   Hypertension Father    Diabetes Father    Stroke Father    Hypertension Sister    Diabetes Sister    Multiple sclerosis Sister    Hypertension Sister    Diabetes Sister    Heart disease Sister    Kidney disease Sister    Hypertension Brother    Schizophrenia Brother 91   Past Surgical History:  Procedure Laterality Date   CHOLECYSTECTOMY     CHONDROPLASTY Left 12/11/2014   Procedure: CHONDROPLASTY LEFT MEDIAL FEMORAL CONDYLE;  Surgeon: Vickki Hearing, MD;  Location: [facility];  Service: Orthopedics;  Laterality: Left;   COLONOSCOPY WITH PROPOFOL N/A 02/27/2020   Carver: Fair prep, diverticulosis, nonbleeding internal hemorrhoids.  Consider Next colonoscopy 10 years   KNEE ARTHROSCOPY WITH LATERAL MENISECTOMY Left 12/11/2014   Procedure: KNEE ARTHROSCOPY WITH LATERAL AND MEDIAL MENISECTOMY;  Surgeon: Vickki Hearing, MD;  Location: [facility];  Service: Orthopedics;  Laterality: Left;   Social History   Occupational History   Not on file  Tobacco Use   Smoking status: Former   Smokeless tobacco: Never  Vaping Use   Vaping status: Never Used  Substance and Sexual Activity   Alcohol use: No   Drug use: No   Sexual activity: Never

## 2023-03-06 NOTE — Procedures (Unsigned)
EMG & NCV Findings: Evaluation of the right median motor nerve showed prolonged distal onset latency (5.8 ms), reduced amplitude (4.9 mV), and decreased conduction velocity (Elbow-Wrist, 47 m/s).  The right median (across palm) sensory nerve showed prolonged distal peak latency (Wrist, 7.6 ms), reduced amplitude (8.0 V), and prolonged distal peak latency (Palm, 2.9 ms).  All remaining nerves (as indicated in the following tables) were within normal limits.    All examined muscles (as indicated in the following table) showed no evidence of electrical instability.    Impression: The above electrodiagnostic study is ABNORMAL and reveals evidence of a severe right median nerve entrapment at the wrist (carpal tunnel syndrome) affecting sensory and motor components.   There is no significant electrodiagnostic evidence of any other focal nerve entrapment, brachial plexopathy or cervical radiculopathy.   Recommendations: 1.  Follow-up with referring physician. 2.  Continue current management of symptoms. 3.  Suggest surgical evaluation.  ___________________________ Colleen [name] FAAPMR Board Certified, American Board of Physical Medicine and Rehabilitation    Nerve Conduction Studies Anti Sensory Summary Table   Stim Site NR Peak (ms) Norm Peak (ms) P-T Amp (V) Norm P-T Amp Site1 Site2 Delta-P (ms) Dist (cm) Vel (m/s) Norm Vel (m/s)  Right Median Acr Palm Anti Sensory (2nd Digit)  31.5C  Wrist    *7.6 <3.6 *8.0 >10 Wrist Palm 4.7 0.0    Palm    *2.9 <2.0 13.3         Right Radial Anti Sensory (Base 1st Digit)  30.1C  Wrist    2.1 <3.1 29.5  Wrist Base 1st Digit 2.1 0.0    Right Ulnar Anti Sensory (5th Digit)  30.7C  Wrist    3.6 <3.7 24.9 >15.0 Wrist 5th Digit 3.6 14.0 39 >38   Motor Summary Table   Stim Site NR Onset (ms) Norm Onset (ms) O-P Amp (mV) Norm O-P Amp Site1 Site2 Delta-0 (ms) Dist (cm) Vel (m/s) Norm Vel (m/s)  Right Median Motor (Abd Poll Brev)  29.9C  Wrist    *5.8  <4.2 *4.9 >5 Elbow Wrist 4.4 20.5 *47 >50  Elbow    10.2  1.1         Right Ulnar Motor (Abd Dig Min)  29.9C  Wrist    3.1 <4.2 10.8 >3 B Elbow Wrist 2.5 18.0 72 >53  B Elbow    5.6  7.7  A Elbow B Elbow 1.6 10.0 62 >53  A Elbow    7.2  9.7          EMG   Side Muscle Nerve Root Ins Act Fibs Psw Amp Dur Poly Recrt Int Dennie Bible Comment  Right Abd Poll Brev Median C8-T1 Nml Nml Nml Nml Nml 0 Nml Nml   Right 1stDorInt Ulnar C8-T1 Nml Nml Nml Nml Nml 0 Nml Nml   Right PronatorTeres Median C6-7 Nml Nml Nml Nml Nml 0 Nml Nml   Right Biceps Musculocut C5-6 Nml Nml Nml Nml Nml 0 Nml Nml   Right Deltoid Axillary C5-6 Nml Nml Nml Nml Nml 0 Nml Nml     Nerve Conduction Studies Anti Sensory Left/Right Comparison   Stim Site L Lat (ms) R Lat (ms) L-R Lat (ms) L Amp (V) R Amp (V) L-R Amp (%) Site1 Site2 L Vel (m/s) R Vel (m/s) L-R Vel (m/s)  Median Acr Palm Anti Sensory (2nd Digit)  31.5C  Wrist  *7.6   *8.0  Wrist Palm     Palm  *2.9   13.3  Radial Anti Sensory (Base 1st Digit)  30.1C  Wrist  2.1   29.5  Wrist Base 1st Digit     Ulnar Anti Sensory (5th Digit)  30.7C  Wrist  3.6   24.9  Wrist 5th Digit  39    Motor Left/Right Comparison   Stim Site L Lat (ms) R Lat (ms) L-R Lat (ms) L Amp (mV) R Amp (mV) L-R Amp (%) Site1 Site2 L Vel (m/s) R Vel (m/s) L-R Vel (m/s)  Median Motor (Abd Poll Brev)  29.9C  Wrist  *5.8   *4.9  Elbow Wrist  *47   Elbow  10.2   1.1        Ulnar Motor (Abd Dig Min)  29.9C  Wrist  3.1   10.8  B Elbow Wrist  72   B Elbow  5.6   7.7  A Elbow B Elbow  62   A Elbow  7.2   9.7           Waveforms:

## 2023-03-08 ENCOUNTER — Encounter: Payer: Self-pay | Admitting: Physical Medicine and Rehabilitation

## 2023-03-19 ENCOUNTER — Ambulatory Visit: Payer: Medicare HMO | Admitting: Orthopedic Surgery

## 2023-03-19 DIAGNOSIS — G5601 Carpal tunnel syndrome, right upper limb: Secondary | ICD-10-CM

## 2023-03-19 MED ORDER — GABAPENTIN 100 MG PO CAPS: 100.0000 mg | ORAL_CAPSULE | Freq: Every day | ORAL | 2 refills | Status: DC

## 2023-03-19 NOTE — Progress Notes (Signed)
Patient: Colleen [name]           Date of Birth: 06/24/1945           MRN: [id number] Visit Date: 03/19/2023 Requested by: Kerri Perches, MD [address] [place],  [place] [address] PCP: Kerri Perches, MD   Chief Complaint  Patient presents with   Hand Pain    Patient had NCS done and here for review she is having numbness in fingers and hand that radiates up to the right shoulder    Encounter Diagnosis  Name Primary?   Carpal tunnel syndrome of right wrist Yes    Plan:  The patient is opted for medical management which will include gabapentin, B6 and splinting  She is made aware that symptoms greater than a year do not do as well with surgery  Follow-up in 2 months  Chief Complaint  Patient presents with   Hand Pain    Patient had NCS done and here for review she is having numbness in fingers and hand that radiates up to the right shoulder     77 year old female with pain and paresthesias right upper extremity including numbness and tingling of the thumb index long finger and part of the ring finger pain radiates up and towards the shoulder she has a history of cervical disc disease as well    There is no height or weight on file to calculate BMI.   Problem list, medical hx, medications and allergies reviewed   Review of Systems  Constitutional:  Negative for fever.  Respiratory:  Negative for shortness of breath.   Cardiovascular:  Negative for chest pain.  Skin: Negative.   Neurological:  Positive for tingling. Negative for sensory change.     Allergies  Allergen Reactions   Aspirin Nausea Only    Upset stomach (higher doses)   Fenofibrate Other (See Comments)    Muscle aches   Statins Other (See Comments)    Muscle cramps   Flonase [Fluticasone] Other (See Comments)    Caused increased pressure in the eye    There were no vitals taken for this visit.   Physical exam: Physical Exam Vitals and nursing note reviewed.   Constitutional:      Appearance: Normal appearance.  HENT:     Head: Normocephalic and atraumatic.  Eyes:     General: No scleral icterus.       Right eye: No discharge.        Left eye: No discharge.     Extraocular Movements: Extraocular movements intact.     Conjunctiva/sclera: Conjunctivae normal.     Pupils: Pupils are equal, round, and reactive to light.  Cardiovascular:     Rate and Rhythm: Normal rate.     Pulses: Normal pulses.  Skin:    General: Skin is warm and dry.     Capillary Refill: Capillary refill takes less than 2 seconds.  Neurological:     General: No focal deficit present.     Mental Status: She is alert and oriented to person, place, and time.  Psychiatric:        Mood and Affect: Mood normal.        Behavior: Behavior normal.        Thought Content: Thought content normal.        Judgment: Judgment normal.     Ortho Exam  MSK:  Sensory changes in the thumb index and long finger positive symptoms also in part of the  ring finger.  Tenderness over the carpal tunnel as well  Data reviewed:   Image(s) reviewed with personal interpretation:  Nerve conduction study shows moderate to severe carpal tunnel syndrome  Impression: The above electrodiagnostic study is ABNORMAL and reveals evidence of a severe right median nerve entrapment at the wrist (carpal tunnel syndrome) affecting sensory and motor components.    There is no significant electrodiagnostic evidence of any other focal nerve entrapment, brachial plexopathy or cervical radiculopathy.    Recommendations: 1.  Follow-up with referring physician. 2.  Continue current management of symptoms. 3.  Suggest surgical evaluation. Assessment and plan:  Encounter Diagnosis  Name Primary?   Carpal tunnel syndrome of right wrist Yes      Meds ordered this encounter  Medications   gabapentin (NEURONTIN) 100 MG capsule    Sig: Take 1 capsule (100 mg total) by mouth at bedtime. Start with 1 hs  may increase up to 3 hs if 1 does not relieve symptoms    Dispense:  90 capsule    Refill:  2    Procedures:   none

## 2023-03-19 NOTE — Patient Instructions (Signed)
B6 100 mg twice a day

## 2023-03-26 ENCOUNTER — Telehealth: Payer: Self-pay | Admitting: Orthopedic Surgery

## 2023-03-26 NOTE — Telephone Encounter (Signed)
Dr. Mort Sawyers pt - she needs info on how to get in touch w/Caroline or someone to pay a bill for her brace.  She had a number, but says she can't get anyone.

## 2023-03-26 NOTE — Telephone Encounter (Signed)
I will message Rayfield Citizen thanks. She will call her.

## 2023-03-28 NOTE — Progress Notes (Unsigned)
   Established Patient Office Visit   Subjective  Patient ID: Colleen [name], female    DOB: 12/18/1945  Age: 77 y.o. MRN: [id number]  No chief complaint on file.   She  has a past medical history of GERD (gastroesophageal reflux disease), Glaucoma, Hyperlipidemia, and Hypertension.  HPI  ROS    Objective:     There were no vitals taken for this visit. {Vitals History (Optional):23777}  Physical Exam   No results found for any visits on 03/29/23.  The 10-year ASCVD risk score (Arnett DK, et al., 2019) is: 13%    Assessment & Plan:  There are no diagnoses linked to this encounter.  No follow-ups on file.   Cruzita Lederer Newman Nip, FNP

## 2023-03-28 NOTE — Patient Instructions (Signed)

## 2023-03-29 ENCOUNTER — Encounter: Payer: Self-pay | Admitting: Family Medicine

## 2023-03-29 ENCOUNTER — Ambulatory Visit (INDEPENDENT_AMBULATORY_CARE_PROVIDER_SITE_OTHER): Payer: Medicare HMO | Admitting: Family Medicine

## 2023-03-29 VITALS — BP 138/76 | HR 68 | Ht 62.0 in | Wt 141.0 lb

## 2023-03-29 DIAGNOSIS — D539 Nutritional anemia, unspecified: Secondary | ICD-10-CM | POA: Diagnosis not present

## 2023-03-29 DIAGNOSIS — R252 Cramp and spasm: Secondary | ICD-10-CM | POA: Insufficient documentation

## 2023-03-29 DIAGNOSIS — M791 Myalgia, unspecified site: Secondary | ICD-10-CM

## 2023-03-29 DIAGNOSIS — H65193 Other acute nonsuppurative otitis media, bilateral: Secondary | ICD-10-CM | POA: Diagnosis not present

## 2023-03-29 DIAGNOSIS — H6690 Otitis media, unspecified, unspecified ear: Secondary | ICD-10-CM | POA: Insufficient documentation

## 2023-03-29 MED ORDER — AZITHROMYCIN 250 MG PO TABS: ORAL_TABLET | ORAL | 0 refills | Status: DC

## 2023-03-29 NOTE — Assessment & Plan Note (Signed)
Azithromycin 250 mg twice daily x 5 days,  Advise patient to rest to support your body's recovery. Stay hydrated by drinking water, tea, or broth. Using a humidifier can help soothe throat irritation and ease nasal congestion. For fever or pain, acetaminophen (Tylenol) is recommended. To relieve other symptoms, try saline nasal sprays, throat lozenges, or gargling with saltwater. Focus on eating light, healthy meals like fruits and vegetables to keep your strength up. Practice good hygiene by washing your hands frequently and covering your mouth when coughing or sneezing.Follow-up for worsening or persistent symptoms. Patient verbalizes understanding regarding plan of care and all questions answered

## 2023-03-29 NOTE — Assessment & Plan Note (Signed)
Labs ordered Iron and Magnesium to rule out any deficiency. No pain with flexion or dorsiflexion, and no swelling, redness, or discomfort while walking I explained to the patient that non-pharmacological interventions include the application of ice or heat, rest, and recommended range of motion exercises along with gentle stretching. For pain management, Tylenol was advised. The patient was instructed to follow up if symptoms worsen or persist. The patient verbalized understanding of the care plan, and all questions were answered.

## 2023-03-30 LAB — MAGNESIUM: Magnesium: 2 mg/dL (ref 1.6–2.3)

## 2023-03-30 LAB — IRON,TIBC AND FERRITIN PANEL
Ferritin: 146 ng/mL (ref 15–150)
Iron Saturation: 20 % (ref 15–55)
Iron: 65 ug/dL (ref 27–139)
Total Iron Binding Capacity: 327 ug/dL (ref 250–450)
UIBC: 262 ug/dL (ref 118–369)

## 2023-03-31 ENCOUNTER — Other Ambulatory Visit: Payer: Self-pay | Admitting: Family Medicine

## 2023-03-31 DIAGNOSIS — E785 Hyperlipidemia, unspecified: Secondary | ICD-10-CM

## 2023-05-18 ENCOUNTER — Ambulatory Visit: Payer: Medicare HMO | Admitting: Orthopedic Surgery

## 2023-05-19 ENCOUNTER — Other Ambulatory Visit: Payer: Self-pay | Admitting: Family Medicine

## 2023-05-24 ENCOUNTER — Ambulatory Visit: Payer: Medicare HMO | Admitting: Orthopedic Surgery

## 2023-05-24 ENCOUNTER — Encounter: Payer: Self-pay | Admitting: Orthopedic Surgery

## 2023-05-24 VITALS — BP 122/71 | HR 80 | Ht 62.0 in | Wt 141.0 lb

## 2023-05-24 DIAGNOSIS — G5601 Carpal tunnel syndrome, right upper limb: Secondary | ICD-10-CM | POA: Diagnosis not present

## 2023-05-24 DIAGNOSIS — Z01818 Encounter for other preprocedural examination: Secondary | ICD-10-CM

## 2023-05-24 NOTE — Progress Notes (Signed)
78 year old female with pain and paresthesias right upper extremity including numbness and tingling of the thumb index long finger and part of the ring finger pain radiates up and towards the shoulder she has a history of cervical disc disease as well   We treated her nonoperatively but she presents back requesting surgical intervention for the symptoms as stated above with pain and paresthesias of the right upper extremity involved with the carpal tunnel including median nerve distributionProblem list, medical hx, medications and allergies reviewed    Review of Systems  Constitutional:  Negative for fever.  Respiratory:  Negative for shortness of breath.   Cardiovascular:  Negative for chest pain.  Skin: Negative.   Neurological:  Positive for tingling. Negative for sensory change.        Allergies       Allergies  Allergen Reactions   Aspirin Nausea Only      Upset stomach (higher doses)   Fenofibrate Other (See Comments)      Muscle aches   Statins Other (See Comments)      Muscle cramps   Flonase [Fluticasone] Other (See Comments)      Caused increased pressure in the eye       Past Medical History:  Diagnosis Date   GERD (gastroesophageal reflux disease)    Glaucoma    Hyperlipidemia    Hypertension     Family History  Problem Relation Age of Onset   Hypertension Mother    Heart disease Mother 40       MI   Hypertension Father    Diabetes Father    Stroke Father    Hypertension Sister    Diabetes Sister    Multiple sclerosis Sister    Hypertension Sister    Diabetes Sister    Heart disease Sister    Kidney disease Sister    Hypertension Brother    Schizophrenia Brother 56    Social History   Tobacco Use   Smoking status: Former   Smokeless tobacco: Never  Advertising account planner   Vaping status: Never Used  Substance Use Topics   Alcohol use: No   Drug use: No     Past Surgical History:  Procedure Laterality Date   CHOLECYSTECTOMY     CHONDROPLASTY  Left 12/11/2014   Procedure: CHONDROPLASTY LEFT MEDIAL FEMORAL CONDYLE;  Surgeon: Vickki Hearing, MD;  Location: [facility];  Service: Orthopedics;  Laterality: Left;   COLONOSCOPY WITH PROPOFOL N/A 02/27/2020   Carver: Fair prep, diverticulosis, nonbleeding internal hemorrhoids.  Consider Next colonoscopy 10 years   KNEE ARTHROSCOPY WITH LATERAL MENISECTOMY Left 12/11/2014   Procedure: KNEE ARTHROSCOPY WITH LATERAL AND MEDIAL MENISECTOMY;  Surgeon: Vickki Hearing, MD;  Location: [facility];  Service: Orthopedics;  Laterality: Left;      There were no vitals taken for this visit.     Physical exam: Physical Exam Vitals and nursing note reviewed.  Constitutional:      Appearance: Normal appearance.  HENT:     Head: Normocephalic and atraumatic.  Eyes:     General: No scleral icterus.       Right eye: No discharge.        Left eye: No discharge.     Extraocular Movements: Extraocular movements intact.     Conjunctiva/sclera: Conjunctivae normal.     Pupils: Pupils are equal, round, and reactive to light.  Cardiovascular:     Rate and Rhythm: Normal rate.     Pulses: Normal pulses.  Skin:  General: Skin is warm and dry.     Capillary Refill: Capillary refill takes less than 2 seconds.  Neurological:     General: No focal deficit present.     Mental Status: She is alert and oriented to person, place, and time.  Psychiatric:        Mood and Affect: Mood normal.        Behavior: Behavior normal.        Thought Content: Thought content normal.        Judgment: Judgment normal.        Ortho Exam   MSK: Upper extremity Sensory changes in the thumb index and long finger positive symptoms also in part of the ring finger.  Tenderness over the carpal tunnel as well   Data reviewed:    Image(s) reviewed with personal interpretation:  Nerve conduction study shows moderate to severe carpal tunnel syndrome  Impression: The above electrodiagnostic study is ABNORMAL and reveals  evidence of a severe right median nerve entrapment at the wrist (carpal tunnel syndrome) affecting sensory and motor components.    There is no significant electrodiagnostic evidence of any other focal nerve entrapment, brachial plexopathy or cervical radiculopathy.     Assessment and plan:       Encounter Diagnosis  Name Primary?   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 Alternative treatment has also been reviewed, questions were encouraged and answered. The postoperative plan is also been reviewed.

## 2023-05-24 NOTE — Progress Notes (Signed)
   BP 122/71   Pulse 80   Ht 5\' 2"  (1.575 m)   Wt 141 lb (64 kg)   BMI 25.79 kg/m   Body mass index is 25.79 kg/m.  Chief Complaint  Patient presents with   Carpal Tunnel    Right     Encounter Diagnosis  Name Primary?   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

## 2023-05-24 NOTE — Patient Instructions (Signed)
 Your surgery will be at [facility] by Dr Romeo Apple  The hospital will contact you with a preoperative appointment to discuss Anesthesia.  Please arrive on time or 15 minutes early for the preoperative appointment, they have a very tight schedule if you are late or do not come in your surgery will be cancelled.  The phone number is [phone]. Please bring your medications with you for the appointment. They will tell you the arrival time and medication instructions when you have your preoperative evaluation. Do not wear nail polish the day of your surgery and if you take Phentermine you need to stop this medication ONE WEEK prior to your surgery. If you take Docia Barrier, Jardiance, or Steglatro) - Hold 72 hours before the procedure.  If you take Ozempic,  Mounjaro, Bydureon or Trulicity do not take for 8 days before your surgery. If you take Victoza, Rybelsis, Saxenda or Adlyxi stop 24 hours before the procedure.  Please arrive at the hospital 2 hours before procedure if scheduled at 9:30 or later in the day or at the time the nurse tells you at your preoperative visit.   If you have my chart do not use the time given in my chart use the time given to you by the nurse during your preoperative visit.   Your surgery  time may change. Please be available for phone calls the day of your surgery and the day before. The Short Stay department may need to discuss changes about your surgery time. Not reaching the you could lead to procedure delays and possible cancellation.  You must have a ride home and someone to stay with you for 24 to 48 hours. The person taking you home will receive and sign for the your discharge instructions.  Please be prepared to give your support person's name and telephone number to Central Registration. Dr Romeo Apple will need that name and phone number post procedure.

## 2023-05-28 DIAGNOSIS — H01002 Unspecified blepharitis right lower eyelid: Secondary | ICD-10-CM | POA: Diagnosis not present

## 2023-05-28 DIAGNOSIS — H2513 Age-related nuclear cataract, bilateral: Secondary | ICD-10-CM | POA: Diagnosis not present

## 2023-05-28 DIAGNOSIS — H01001 Unspecified blepharitis right upper eyelid: Secondary | ICD-10-CM | POA: Diagnosis not present

## 2023-05-28 DIAGNOSIS — H40053 Ocular hypertension, bilateral: Secondary | ICD-10-CM | POA: Diagnosis not present

## 2023-06-01 NOTE — Patient Instructions (Signed)
Colleen [name]  06/01/2023     @PREFPERIOPPHARMACY @   Your procedure is scheduled on  06/06/2023.   Report to [facility] at  0600  A.M.   Call this number if you have problems the morning of surgery:  [phone]  If you experience any cold or flu symptoms such as cough, fever, chills, shortness of breath, etc. between now and your scheduled surgery, please notify us at the above number.   Remember:  Do not eat after midnight.   You may drink clear liquids until 0330 am .    Clear liquids allowed are:                    Water, Juice (No red color; non-citric and without pulp; diabetics please choose diet or no sugar options), Carbonated beverages (diabetics please choose diet or no sugar options), Clear Tea (No creamer, milk, or cream, including half & half and powdered creamer), Black Coffee Only (No creamer, milk or cream, including half & half and powdered creamer), and Clear Sports drink (No red color; diabetics please choose diet or no sugar options)    Take these medicines the morning of surgery with A SIP OF WATER                            amlodipine, omeprazole.    Do not wear jewelry, make-up or nail polish, including gel polish,  artificial nails, or any other type of covering on natural nails (fingers and  toes).  Do not wear lotions, powders, or perfumes, or deodorant.  Do not shave 48 hours prior to surgery.  Men may shave face and neck.  Do not bring valuables to the hospital.  [facility] is not responsible for any belongings or valuables.  Contacts, dentures or bridgework may not be worn into surgery.  Leave your suitcase in the car.  After surgery it may be brought to your room.  For patients admitted to the hospital, discharge time will be determined by your treatment team.  Patients discharged the day of surgery will not be allowed to drive home and must have someone with them for 24 hours.    Special instructions:  DO NOT smoke tobacco or vape  for 24 hours before your procedure.  Please read over the following fact sheets that you were given. Coughing and Deep Breathing, Anesthesia Post-op Instructions, and Care and Recovery After Surgery      Open Carpal Tunnel Release: What to Expect Open carpal tunnel release is a surgery to relieve symptoms caused by carpal tunnel syndrome (CTS). CTS causes swelling in a narrow space in your wrist. The swelling pinches the median nerve, causing pain, numbness, and weakness in your hand. You may need this surgery if other treatments haven't helped your symptoms. The surgery involves cutting a ligament in your wrist to relieve pressure on the median nerve. Tell a health care provider about: Any allergies you have. All medicines you take. These include vitamins, herbs, eye drops, and creams. Any problems you or family members have had with anesthesia. Any bleeding problems you have. Any surgeries you've had. Any medical problems you have. Whether you're pregnant or may be pregnant. What are the risks? Your health care provider will talk with you about risks. These may include: Infection. Bleeding. Allergic reactions to medicines. Damage to the nerve, a blood vessel, or other nearby structures. Failed treatment. The  surgery fails to treat your symptoms or make your symptoms worse. Long-term weakness of the hand. What happens before? When to stop eating and drinking Eat and drink only as you've been told. You may be told this: 8 hours before your surgery Stop eating most foods. Do not eat meat, fried foods, or fatty foods. Eat only light foods, such as toast or crackers. All liquids are OK except energy drinks and alcohol. 6 hours before your surgery Stop eating. Drink only clear liquids, such as water, clear fruit juice, black coffee, plain tea, and sports drinks. Do not drink energy drinks or alcohol. 2 hours before your surgery Stop drinking all liquids. You may be allowed to take  medicines with small sips of water. If you do not eat and drink as told, your surgery may be delayed or canceled. Medicines Ask about changing or stopping: Any medicines you take. Any vitamins, herbs, or supplements you take. Do not take aspirin or ibuprofen unless you're told to. Surgery safety For your safety, you may: Need to wash your skin with a soap that kills germs. Get antibiotics. Have your surgery site marked. Have hair removed at the surgery site. General instructions Ask if you'll be staying overnight in the hospital. If you'll be going home right after the surgery, plan to have a responsible adult: Drive you home from the hospital or clinic. You won't be allowed to drive. Stay with you for the time you're told. Do not smoke, vape, or use nicotine or tobacco for at least 4 weeks before the surgery. What happens during open carpal tunnel release?  An IV will be put into a vein in your hand or arm. You may be given: A sedative to help you relax. Anesthesia to keep you from feeling pain. A cut will be made in your wrist, on the same side as your palm. The skin of your wrist will be spread to expose the transverse carpal ligament. The ligament will be cut to make more room in the carpal tunnel space. Your cut will be closed with stitches or staples. A compression bandage will be wrapped around your hand and wrist. These steps may vary. Ask what you can expect. What happens after? You'll be watched closely until you leave. This includes checking your pain level, blood pressure, heart rate, and breathing rate. You'll be given pain medicine as needed. A splint or brace may be placed over your bandage. This will hold your hand and wrist in place while you heal. This information is not intended to replace advice given to you by your health care provider. Make sure you discuss any questions you have with your health care provider. Document Revised: 01/22/2023 Document Reviewed:  01/22/2023 Elsevier Patient Education  2024 [employer].General Anesthesia, Adult, Care After The following information offers guidance on how to care for yourself after your procedure. Your health care provider may also give you more specific instructions. If you have problems or questions, contact your health care provider. What can I expect after the procedure? After the procedure, it is common for people to: Have pain or discomfort at the IV site. Have nausea or vomiting. Have a sore throat or hoarseness. Have trouble concentrating. Feel cold or chills. Feel weak, sleepy, or tired (fatigue). Have soreness and body aches. These can affect parts of the body that were not involved in surgery. Follow these instructions at home: For the time period you were told by your health care provider:  Rest. Do not participate in  activities where you could fall or become injured. Do not drive or use machinery. Do not drink alcohol. Do not take sleeping pills or medicines that cause drowsiness. Do not make important decisions or sign legal documents. Do not take care of children on your own. General instructions Drink enough fluid to keep your urine pale yellow. If you have sleep apnea, surgery and certain medicines can increase your risk for breathing problems. Follow instructions from your health care provider about wearing your sleep device: Anytime you are sleeping, including during daytime naps. While taking prescription pain medicines, sleeping medicines, or medicines that make you drowsy. Return to your normal activities as told by your health care provider. Ask your health care provider what activities are safe for you. Take over-the-counter and prescription medicines only as told by your health care provider. Do not use any products that contain nicotine or tobacco. These products include cigarettes, chewing tobacco, and vaping devices, such as e-cigarettes. These can delay incision  healing after surgery. If you need help quitting, ask your health care provider. Contact a health care provider if: You have nausea or vomiting that does not get better with medicine. You vomit every time you eat or drink. You have pain that does not get better with medicine. You cannot urinate or have bloody urine. You develop a skin rash. You have a fever. Get help right away if: You have trouble breathing. You have chest pain. You vomit blood. These symptoms may be an emergency. Get help right away. Call 911. Do not wait to see if the symptoms will go away. Do not drive yourself to the hospital. Summary After the procedure, it is common to have a sore throat, hoarseness, nausea, vomiting, or to feel weak, sleepy, or fatigue. For the time period you were told by your health care provider, do not drive or use machinery. Get help right away if you have difficulty breathing, have chest pain, or vomit blood. These symptoms may be an emergency. This information is not intended to replace advice given to you by your health care provider. Make sure you discuss any questions you have with your health care provider. Document Revised: 07/22/2021 Document Reviewed: 07/22/2021 Elsevier Patient Education  2024 [employer].How to Use Chlorhexidine at Home in the Shower Chlorhexidine gluconate (CHG) is a germ-killing (antiseptic) wash that's used to clean the skin. It can get rid of the germs that normally live on the skin and can keep them away for about 24 hours. If you're having surgery, you may be told to shower with CHG at home the night before surgery. This can help lower your risk for infection. To use CHG wash in the shower, follow the steps below. Supplies needed: CHG body wash. Clean washcloth. Clean towel. How to use CHG in the shower Follow these steps unless you're told to use CHG in a different way: Start the shower. Use your normal soap and shampoo to wash your face and  hair. Turn off the shower or move out of the shower stream. Pour CHG onto a clean washcloth. Do not use any type of brush or rough sponge. Start at your neck, washing your body down to your toes. Make sure you: Wash the part of your body where the surgery will be done for at least 1 minute. Do not scrub. Do not use CHG on your head or face unless your health care provider tells you to. If it gets into your ears or eyes, rinse them well with water.  Do not wash your genitals with CHG. Wash your back and under your arms. Make sure to wash skin folds. Let the CHG sit on your skin for 1-2 minutes or as long as told. Rinse your entire body in the shower, including all body creases and folds. Turn off the shower. Dry off with a clean towel. Do not put anything on your skin afterward, such as powder, lotion, or perfume. Put on clean clothes or pajamas. If it's the night before surgery, sleep in clean sheets. General tips Use CHG only as told, and follow the instructions on the label. Use the full amount of CHG as told. This is often one bottle. Do not smoke and stay away from flames after using CHG. Your skin may feel sticky after using CHG. This is normal. The sticky feeling will go away as the CHG dries. Do not use CHG: If you have a chlorhexidine allergy or have reacted to chlorhexidine in the past. On open wounds or areas of skin that have broken skin, cuts, or scrapes. On babies younger than 31 months of age. Contact a health care provider if: You have questions about using CHG. Your skin gets irritated or itchy. You have a rash after using CHG. You swallow any CHG. Call your local poison control center [phone] in the [place] Your eyes itch badly, or they become very red or swollen. Your hearing changes. You have trouble seeing. If you can't reach your provider, go to an urgent care or emergency room. Do not drive yourself. Get help right away if: You have swelling or tingling in  your mouth or throat. You make high-pitched whistling sounds when you breathe, most often when you breathe out (wheeze). You have trouble breathing. These symptoms may be an emergency. Call 911 right away. Do not wait to see if the symptoms will go away. Do not drive yourself to the hospital. This information is not intended to replace advice given to you by your health care provider. Make sure you discuss any questions you have with your health care provider. Document Revised: 11/07/2022 Document Reviewed: 11/03/2021 Elsevier Patient Education  2024 [employer].

## 2023-06-04 ENCOUNTER — Encounter (HOSPITAL_COMMUNITY): Payer: Self-pay

## 2023-06-04 ENCOUNTER — Encounter (HOSPITAL_COMMUNITY)
Admission: RE | Admit: 2023-06-04 | Discharge: 2023-06-04 | Disposition: A | Discharge status: Home / Self Care | Payer: Medicare PPO | Source: Ambulatory Visit | Attending: Orthopedic Surgery | Admitting: Orthopedic Surgery

## 2023-06-04 DIAGNOSIS — R9431 Abnormal electrocardiogram [ECG] [EKG]: Secondary | ICD-10-CM | POA: Diagnosis not present

## 2023-06-04 DIAGNOSIS — G5601 Carpal tunnel syndrome, right upper limb: Secondary | ICD-10-CM | POA: Diagnosis not present

## 2023-06-04 DIAGNOSIS — Z01818 Encounter for other preprocedural examination: Secondary | ICD-10-CM | POA: Diagnosis not present

## 2023-06-04 LAB — CBC WITH DIFFERENTIAL/PLATELET
Abs Immature Granulocytes: 0.01 10*3/uL (ref 0.00–0.07)
Basophils Absolute: 0 10*3/uL (ref 0.0–0.1)
Basophils Relative: 1 %
Eosinophils Absolute: 0 10*3/uL (ref 0.0–0.5)
Eosinophils Relative: 1 %
HCT: 38.6 % (ref 36.0–46.0)
Hemoglobin: 13.5 g/dL (ref 12.0–15.0)
Immature Granulocytes: 0 %
Lymphocytes Relative: 35 %
Lymphs Abs: 1.5 10*3/uL (ref 0.7–4.0)
MCH: 31.1 pg (ref 26.0–34.0)
MCHC: 35 g/dL (ref 30.0–36.0)
MCV: 88.9 fL (ref 80.0–100.0)
Monocytes Absolute: 0.2 10*3/uL (ref 0.1–1.0)
Monocytes Relative: 5 %
Neutro Abs: 2.6 10*3/uL (ref 1.7–7.7)
Neutrophils Relative %: 58 %
Platelets: 228 10*3/uL (ref 150–400)
RBC: 4.34 MIL/uL (ref 3.87–5.11)
RDW: 12.7 % (ref 11.5–15.5)
WBC: 4.3 10*3/uL (ref 4.0–10.5)
nRBC: 0 % (ref 0.0–0.2)

## 2023-06-04 LAB — BASIC METABOLIC PANEL
Anion gap: 10 (ref 5–15)
BUN: 23 mg/dL (ref 8–23)
CO2: 23 mmol/L (ref 22–32)
Calcium: 9.8 mg/dL (ref 8.9–10.3)
Chloride: 104 mmol/L (ref 98–111)
Creatinine, Ser: 0.83 mg/dL (ref 0.44–1.00)
GFR, Estimated: >60 mL/min (ref >60)
Glucose, Bld: 80 mg/dL (ref 70–99)
Potassium: 3.5 mmol/L (ref 3.5–5.1)
Sodium: 137 mmol/L (ref 135–145)

## 2023-06-05 NOTE — H&P (Signed)
H/P  Outpx surgery   Open right carpal tunnel release   Cc: pain numbness right hand   78 year old female with pain and paresthesias right upper extremity including numbness and tingling of the thumb index long finger and part of the ring finger pain radiates up and towards the shoulder she has a history of cervical disc disease as well   We treated her nonoperatively but she presents back requesting surgical intervention for the symptoms as stated above with pain and paresthesias of the right upper extremity involved with the carpal tunnel including median nerve distributionProblem list, medical hx, medications and allergies reviewed    Review of Systems  Constitutional:  Negative for fever.  Respiratory:  Negative for shortness of breath.   Cardiovascular:  Negative for chest pain.  Skin: Negative.   Neurological:  Positive for tingling. Negative for sensory change.        Allergies           Allergies  Allergen Reactions   Aspirin Nausea Only      Upset stomach (higher doses)   Fenofibrate Other (See Comments)      Muscle aches   Statins Other (See Comments)      Muscle cramps   Flonase [Fluticasone] Other (See Comments)      Caused increased pressure in the eye            Past Medical History:  Diagnosis Date   GERD (gastroesophageal reflux disease)     Glaucoma     Hyperlipidemia     Hypertension                 Family History  Problem Relation Age of Onset   Hypertension Mother     Heart disease Mother 83        MI   Hypertension Father     Diabetes Father     Stroke Father     Hypertension Sister     Diabetes Sister     Multiple sclerosis Sister     Hypertension Sister     Diabetes Sister     Heart disease Sister     Kidney disease Sister     Hypertension Brother     Schizophrenia Brother 72          Social History  Social History        Tobacco Use   Smoking status: Former   Smokeless tobacco: Never  Advertising account planner   Vaping status:  Never Used  Substance Use Topics   Alcohol use: No   Drug use: No               Past Surgical History:  Procedure Laterality Date   CHOLECYSTECTOMY       CHONDROPLASTY Left 12/11/2014    Procedure: CHONDROPLASTY LEFT MEDIAL FEMORAL CONDYLE;  Surgeon: Vickki Hearing, MD;  Location: [facility];  Service: Orthopedics;  Laterality: Left;   COLONOSCOPY WITH PROPOFOL N/A 02/27/2020    Carver: Fair prep, diverticulosis, nonbleeding internal hemorrhoids.  Consider Next colonoscopy 10 years   KNEE ARTHROSCOPY WITH LATERAL MENISECTOMY Left 12/11/2014    Procedure: KNEE ARTHROSCOPY WITH LATERAL AND MEDIAL MENISECTOMY;  Surgeon: Vickki Hearing, MD;  Location: [facility];  Service: Orthopedics;  Laterality: Left;            There were no vitals taken for this visit.     Physical exam: Physical Exam Vitals and nursing note reviewed.  Constitutional:      Appearance: Normal appearance.  HENT:     Head: Normocephalic and atraumatic.  Eyes:     General: No scleral icterus.       Right eye: No discharge.        Left eye: No discharge.     Extraocular Movements: Extraocular movements intact.     Conjunctiva/sclera: Conjunctivae normal.     Pupils: Pupils are equal, round, and reactive to light.  Cardiovascular:     Rate and Rhythm: Normal rate.     Pulses: Normal pulses.  Skin:    General: Skin is warm and dry.     Capillary Refill: Capillary refill takes less than 2 seconds.  Neurological:     General: No focal deficit present.     Mental Status: She is alert and oriented to person, place, and time.  Psychiatric:        Mood and Affect: Mood normal.        Behavior: Behavior normal.        Thought Content: Thought content normal.        Judgment: Judgment normal.        Ortho Exam   MSK: Upper extremity Sensory changes in the thumb index and long finger positive symptoms also in part of the ring finger.  Tenderness over the carpal tunnel as well   Data reviewed:    Image(s)  reviewed with personal interpretation:  Nerve conduction study shows moderate to severe carpal tunnel syndrome  Impression: The above electrodiagnostic study is ABNORMAL and reveals evidence of a severe right median nerve entrapment at the wrist (carpal tunnel syndrome) affecting sensory and motor components.    There is no significant electrodiagnostic evidence of any other focal nerve entrapment, brachial plexopathy or cervical radiculopathy.      Assessment and plan:          Encounter Diagnosis  Name Primary?   Carpal tunnel syndrome of right wrist Yes      Open right carpal tunnel release   The procedure has been fully reviewed with the patient; The risks and benefits of surgery have been discussed and explained and understood. Alternative treatment has also been reviewed, questions were encouraged and answered. The postoperative plan is also been reviewed.

## 2023-06-06 ENCOUNTER — Encounter (HOSPITAL_COMMUNITY): Payer: Self-pay | Admitting: Orthopedic Surgery

## 2023-06-06 ENCOUNTER — Encounter (HOSPITAL_COMMUNITY)
Admission: RE | Disposition: A | Discharge status: Home / Self Care | Payer: Self-pay | Source: Home / Self Care | Attending: Orthopedic Surgery

## 2023-06-06 ENCOUNTER — Ambulatory Visit (HOSPITAL_BASED_OUTPATIENT_CLINIC_OR_DEPARTMENT_OTHER): Payer: Medicare PPO | Admitting: Anesthesiology

## 2023-06-06 ENCOUNTER — Ambulatory Visit (HOSPITAL_COMMUNITY): Payer: Medicare PPO | Admitting: Anesthesiology

## 2023-06-06 ENCOUNTER — Ambulatory Visit (HOSPITAL_COMMUNITY)
Admission: RE | Admit: 2023-06-06 | Discharge: 2023-06-06 | Disposition: A | Discharge status: Home / Self Care | Payer: Medicare PPO | Attending: Orthopedic Surgery | Admitting: Orthopedic Surgery

## 2023-06-06 DIAGNOSIS — I10 Essential (primary) hypertension: Secondary | ICD-10-CM | POA: Insufficient documentation

## 2023-06-06 DIAGNOSIS — Z87891 Personal history of nicotine dependence: Secondary | ICD-10-CM | POA: Insufficient documentation

## 2023-06-06 DIAGNOSIS — G5601 Carpal tunnel syndrome, right upper limb: Secondary | ICD-10-CM

## 2023-06-06 HISTORY — PX: CARPAL TUNNEL RELEASE: SHX101

## 2023-06-06 SURGERY — CARPAL TUNNEL RELEASE
Anesthesia: General | Site: Hand | Laterality: Right

## 2023-06-06 MED ORDER — DEXAMETHASONE SODIUM PHOSPHATE 10 MG/ML IJ SOLN
INTRAMUSCULAR | Status: AC
  Filled 2023-06-06: qty 1

## 2023-06-06 MED ORDER — ONDANSETRON HCL 4 MG/2ML IJ SOLN
INTRAMUSCULAR | Status: AC
  Filled 2023-06-06: qty 2

## 2023-06-06 MED ORDER — 0.9 % SODIUM CHLORIDE (POUR BTL) OPTIME
TOPICAL | Status: DC | PRN
  Administered 2023-06-06: 1000 mL

## 2023-06-06 MED ORDER — ONDANSETRON HCL 4 MG/2ML IJ SOLN: 4.0000 mg | Freq: Once | INTRAMUSCULAR | Status: DC | PRN

## 2023-06-06 MED ORDER — DEXAMETHASONE SODIUM PHOSPHATE 10 MG/ML IJ SOLN
INTRAMUSCULAR | Status: DC | PRN
  Administered 2023-06-06: 5 mg via INTRAVENOUS

## 2023-06-06 MED ORDER — LIDOCAINE HCL (PF) 2 % IJ SOLN
INTRAMUSCULAR | Status: AC
  Filled 2023-06-06: qty 5

## 2023-06-06 MED ORDER — GLYCOPYRROLATE PF 0.2 MG/ML IJ SOSY
PREFILLED_SYRINGE | INTRAMUSCULAR | Status: AC
  Filled 2023-06-06: qty 1

## 2023-06-06 MED ORDER — LIDOCAINE HCL (CARDIAC) PF 100 MG/5ML IV SOSY
PREFILLED_SYRINGE | INTRAVENOUS | Status: DC | PRN
  Administered 2023-06-06: 40 mg via INTRAVENOUS
  Administered 2023-06-06: 60 mg via INTRAVENOUS

## 2023-06-06 MED ORDER — FENTANYL CITRATE (PF) 100 MCG/2ML IJ SOLN
INTRAMUSCULAR | Status: AC
  Filled 2023-06-06: qty 2

## 2023-06-06 MED ORDER — OXYCODONE HCL 5 MG/5ML PO SOLN: 5.0000 mg | Freq: Once | ORAL | Status: DC | PRN

## 2023-06-06 MED ORDER — PROPOFOL 10 MG/ML IV BOLUS
INTRAVENOUS | Status: DC | PRN
  Administered 2023-06-06: 40 mg via INTRAVENOUS
  Administered 2023-06-06: 30 mg via INTRAVENOUS
  Administered 2023-06-06: 120 mg via INTRAVENOUS

## 2023-06-06 MED ORDER — FENTANYL CITRATE (PF) 100 MCG/2ML IJ SOLN
INTRAMUSCULAR | Status: DC | PRN
  Administered 2023-06-06 (×2): 12.5 ug via INTRAVENOUS

## 2023-06-06 MED ORDER — LACTATED RINGERS IV SOLN: INTRAVENOUS | Status: DC

## 2023-06-06 MED ORDER — PHENYLEPHRINE 80 MCG/ML (10ML) SYRINGE FOR IV PUSH (FOR BLOOD PRESSURE SUPPORT)
PREFILLED_SYRINGE | INTRAVENOUS | Status: AC
  Filled 2023-06-06: qty 10

## 2023-06-06 MED ORDER — LIDOCAINE HCL (PF) 1 % IJ SOLN
INTRAMUSCULAR | Status: DC | PRN
  Administered 2023-06-06: 3.5 mL

## 2023-06-06 MED ORDER — PHENYLEPHRINE 80 MCG/ML (10ML) SYRINGE FOR IV PUSH (FOR BLOOD PRESSURE SUPPORT)
PREFILLED_SYRINGE | INTRAVENOUS | Status: DC | PRN
  Administered 2023-06-06 (×3): 80 ug via INTRAVENOUS

## 2023-06-06 MED ORDER — CHLORHEXIDINE GLUCONATE 0.12 % MT SOLN
15.0000 mL | Freq: Once | OROMUCOSAL | Status: AC
  Administered 2023-06-06: 15 mL via OROMUCOSAL

## 2023-06-06 MED ORDER — LIDOCAINE HCL (PF) 1 % IJ SOLN
INTRAMUSCULAR | Status: AC
  Filled 2023-06-06: qty 30

## 2023-06-06 MED ORDER — ONDANSETRON HCL 4 MG/2ML IJ SOLN
INTRAMUSCULAR | Status: DC | PRN
  Administered 2023-06-06: 4 mg via INTRAVENOUS

## 2023-06-06 MED ORDER — TRAMADOL HCL 50 MG PO TABS: 50.0000 mg | ORAL_TABLET | Freq: Four times a day (QID) | ORAL | 0 refills | Status: AC

## 2023-06-06 MED ORDER — CEFAZOLIN SODIUM-DEXTROSE 2-4 GM/100ML-% IV SOLN
INTRAVENOUS | Status: AC
  Filled 2023-06-06: qty 100

## 2023-06-06 MED ORDER — GLYCOPYRROLATE PF 0.2 MG/ML IJ SOSY
PREFILLED_SYRINGE | INTRAMUSCULAR | Status: DC | PRN
  Administered 2023-06-06: .2 mg via INTRAVENOUS

## 2023-06-06 MED ORDER — BUPIVACAINE HCL (PF) 0.5 % IJ SOLN
INTRAMUSCULAR | Status: AC
  Filled 2023-06-06: qty 30

## 2023-06-06 MED ORDER — PROPOFOL 500 MG/50ML IV EMUL
INTRAVENOUS | Status: DC | PRN
  Administered 2023-06-06: 75 ug/kg/min via INTRAVENOUS

## 2023-06-06 MED ORDER — ORAL CARE MOUTH RINSE: 15.0000 mL | Freq: Once | OROMUCOSAL | Status: AC

## 2023-06-06 MED ORDER — PROPOFOL 10 MG/ML IV BOLUS
INTRAVENOUS | Status: AC
  Filled 2023-06-06: qty 20

## 2023-06-06 MED ORDER — OXYCODONE HCL 5 MG PO TABS: 5.0000 mg | ORAL_TABLET | Freq: Once | ORAL | Status: DC | PRN

## 2023-06-06 MED ORDER — BUPIVACAINE HCL (PF) 0.5 % IJ SOLN
INTRAMUSCULAR | Status: DC | PRN
  Administered 2023-06-06: 10 mL

## 2023-06-06 MED ORDER — FENTANYL CITRATE PF 50 MCG/ML IJ SOSY
25.0000 ug | PREFILLED_SYRINGE | INTRAMUSCULAR | Status: DC | PRN
  Administered 2023-06-06: 50 ug via INTRAVENOUS
  Filled 2023-06-06: qty 1

## 2023-06-06 MED ORDER — CEFAZOLIN SODIUM-DEXTROSE 2-4 GM/100ML-% IV SOLN
2.0000 g | INTRAVENOUS | Status: AC
  Administered 2023-06-06: 2 g via INTRAVENOUS

## 2023-06-06 MED ORDER — PROPOFOL 10 MG/ML IV BOLUS
INTRAVENOUS | Status: AC
Start: 2023-06-06 — End: ?
  Filled 2023-06-06: qty 20

## 2023-06-06 SURGICAL SUPPLY — 34 items
BANDAGE ESMARK 4X12 BL STRL LF (DISPOSABLE) ×1 IMPLANT
BLADE SURG 15 STRL LF DISP TIS (BLADE) ×1 IMPLANT
BNDG ELASTIC 3X5.8 VLCR NS LF (GAUZE/BANDAGES/DRESSINGS) ×1 IMPLANT
BNDG ESMARK 4X12 BLUE STRL LF (DISPOSABLE) ×1
BNDG GAUZE DERMACEA FLUFF 4 (GAUZE/BANDAGES/DRESSINGS) IMPLANT
BNDG GAUZE ELAST 4 BULKY (GAUZE/BANDAGES/DRESSINGS) ×1 IMPLANT
CHLORAPREP W/TINT 26 (MISCELLANEOUS) ×1 IMPLANT
CLOTH BEACON ORANGE TIMEOUT ST (SAFETY) ×1 IMPLANT
COVER LIGHT HANDLE STERIS (MISCELLANEOUS) ×2 IMPLANT
CUFF TOURN SGL QUICK 18X4 (TOURNIQUET CUFF) ×1 IMPLANT
ELECT REM PT RETURN 9FT ADLT (ELECTROSURGICAL) ×1
ELECTRODE REM PT RTRN 9FT ADLT (ELECTROSURGICAL) ×1 IMPLANT
GAUZE SPONGE 4X4 12PLY STRL (GAUZE/BANDAGES/DRESSINGS) ×1 IMPLANT
GAUZE XEROFORM 1X8 LF (GAUZE/BANDAGES/DRESSINGS) ×1 IMPLANT
GLOVE BIOGEL PI IND STRL 7.0 (GLOVE) ×2 IMPLANT
GLOVE BIOGEL PI IND STRL 8.5 (GLOVE) IMPLANT
GLOVE SKINSENSE STRL SZ8.0 LF (GLOVE) IMPLANT
GLOVE SS N UNI LF 8.5 STRL (GLOVE) ×1 IMPLANT
GOWN STRL REUS W/TWL LRG LVL3 (GOWN DISPOSABLE) ×1 IMPLANT
GOWN STRL REUS W/TWL XL LVL3 (GOWN DISPOSABLE) ×1 IMPLANT
KIT TURNOVER KIT A (KITS) ×1 IMPLANT
MANIFOLD NEPTUNE II (INSTRUMENTS) ×1 IMPLANT
NDL HYPO 21X1.5 SAFETY (NEEDLE) ×1 IMPLANT
NEEDLE HYPO 21X1.5 SAFETY (NEEDLE) ×1
NS IRRIG 1000ML POUR BTL (IV SOLUTION) ×1 IMPLANT
PACK BASIC LIMB (CUSTOM PROCEDURE TRAY) ×1 IMPLANT
PAD ARMBOARD 7.5X6 YLW CONV (MISCELLANEOUS) ×1 IMPLANT
POSITIONER HAND ALUMI XLG (MISCELLANEOUS) ×1 IMPLANT
POSITIONER HEAD 8X9X4 ADT (SOFTGOODS) ×1 IMPLANT
SET BASIN LINEN APH (SET/KITS/TRAYS/PACK) ×1 IMPLANT
SPIKE FLUID TRANSFER (MISCELLANEOUS) IMPLANT
SUT 3-0 BLK 1X30 PSL (SUTURE) ×1 IMPLANT
SYR 10ML LL (SYRINGE) IMPLANT
SYR CONTROL 10ML LL (SYRINGE) ×1 IMPLANT

## 2023-06-06 NOTE — Progress Notes (Signed)
Instructed on incentive spirometer. 1250 ml obtained. Tolerated well.

## 2023-06-06 NOTE — Transfer of Care (Signed)
Immediate Anesthesia Transfer of Care Note  Patient: Colleen [name]  Procedure(s) Performed: CARPAL TUNNEL RELEASE (Right: Hand)  Patient Location: PACU  Anesthesia Type:General  Level of Consciousness: drowsy and patient cooperative  Airway & Oxygen Therapy: Patient Spontanous Breathing and Patient connected to face mask oxygen  Post-op Assessment: Report given to RN and Post -op Vital signs reviewed and stable  Post vital signs: Reviewed and stable  Last Vitals:  Vitals Value Taken Time  BP 121/49 06/06/23 0826  Temp 97.7 06/06/23   0826  Pulse 80 06/06/23 0828  Resp 19 06/06/23 0828  SpO2 100 % 06/06/23 0828  Vitals shown include unfiled device data.  Last Pain:  Vitals:   06/06/23 0637  PainSc: 0-No pain         Complications:  Encounter Notable Events  Notable Event Outcome Phase Comment  Difficult to intubate - unexpected  Intraprocedure Filed from anesthesia note documentation.

## 2023-06-06 NOTE — Brief Op Note (Signed)
06/06/2023  8:18 AM  PATIENT:  Colleen [name]  78 y.o. female  PRE-OPERATIVE DIAGNOSIS:  right carpal tunnel syndrome  POST-OPERATIVE DIAGNOSIS:  right carpal tunnel syndrome  PROCEDURE:  Procedure(s): CARPAL TUNNEL RELEASE (Right)  SURGEON:  Surgeons and Role:    Vickki Hearing, MD - Primary  PHYSICIAN ASSISTANT:   ASSISTANTS: none   ANESTHESIA:   MAC  EBL:  minimal    BLOOD ADMINISTERED:none  DRAINS: none   LOCAL MEDICATIONS USED:  MARCAINE    and LIDOCAINE   SPECIMEN:  No Specimen  DISPOSITION OF SPECIMEN:  N/A  COUNTS:  YES  TOURNIQUET:   Total Tourniquet Time Documented: Upper Arm (Right) - 14 minutes Total: Upper Arm (Right) - 14 minutes   DICTATION: .Reubin Milan Dictation  PLAN OF CARE: Discharge to home after PACU  PATIENT DISPOSITION:  PACU - hemodynamically stable.   Delay start of Pharmacological VTE agent (>24hrs) due to surgical blood loss or risk of bleeding: not applicable

## 2023-06-06 NOTE — Op Note (Signed)
Date 8:19 AM  06/06/2023    PRE-OPERATIVE DIAGNOSIS:  RIGHT  CARPAL TUNNEL SYNDROME  POST-OPERATIVE DIAGNOSIS:  RIGHT CARPAL TUNNEL SYNDROME   FINDINGS: median n. Compression and apple core appearance, color dull    PROCEDURE:  Procedure(s): RIGHT CARPAL TUNNEL RELEASE RIGHT   SURGEON:  Surgeon(s) and Role:    * Vickki Hearing, MD - Primary  PHYSICIAN ASSISTANT:   ASSISTANTS: none   ANESTHESIA:  MAC with 1% plain lidocaine   BLOOD ADMINISTERED:none  DRAINS: none   LOCAL MEDICATIONS USED:  MARCAINE     SPECIMEN:  No Specimen  DISPOSITION OF SPECIMEN:  N/A  COUNTS:  YES  TOURNIQUET:  250 mm 14 min  DICTATION: .Dragon Dictation   Surgeon Romeo Apple  Operative findings compression of the RIGHT MEDIAN NERVE   Indications failure of conservative treatment to relieve pain and paresthesias and numbness and tingling of the RIGHT HAND   The patient was identified in the preop area we confirm the surgical site marked as right wrist. Chart update completed. Patient taken to surgery.  Antibiotic 2gm ancef   after establishing a successful anesthesia MAC and lidocaine 1% the arm was prepped with sterile technique  Timeout executed completed and confirmed site. RIGHT WRIST  /  HAND   A straight incision was made over the RIGHT carpal tunnel in line with the radial border of the ring finger. Blunt dissection was carried out to find the distal aspect of the carpal tunnel. A blunted judgment was passed beneath the carpal tunnel. Sharp incision was then used to release the transverse carpal ligament. The contents of the carpal tunnel were inspected. No evidence of synovitis or space occupying lesions  The wound was irrigated and then closed with 3-0 nylon suture. We injected 10 mL of plain Marcaine on the radial side of the incision  A sterile bandage was applied and the tourniquet was released the color of the hand and capillary refill were normal  The patient was taken to the  recovery room in stable condition  PLAN OF CARE: Discharge to home after PACU  PATIENT DISPOSITION:  PACU - hemodynamically stable.   Delay start of Pharmacological VTE agent (>24hrs) due to surgical blood loss or risk of bleeding: not applicable

## 2023-06-06 NOTE — Anesthesia Procedure Notes (Addendum)
Procedure Name: LMA Insertion Date/Time: 06/06/2023 7:41 AM  Performed by: Oletha Cruel, CRNAPre-anesthesia Checklist: Patient identified, Emergency Drugs available, Suction available and Patient being monitored Patient Re-evaluated:Patient Re-evaluated prior to induction Oxygen Delivery Method: Circle system utilized Preoxygenation: Pre-oxygenation with 100% oxygen Induction Type: IV induction Ventilation: Mask ventilation without difficulty Number of attempts: 3 Difficulty Due To: Difficulty was unanticipated Comments: Unable to "seat" LMA size 4 after three attempts. Copious secretions. Coughing. Plan to convert to TIVA.

## 2023-06-06 NOTE — Anesthesia Preprocedure Evaluation (Signed)
Anesthesia Evaluation  Patient identified by MRN, date of birth, ID band Patient awake    Reviewed: Allergy & Precautions, H&P , NPO status , Patient's Chart, lab work & pertinent test results, reviewed documented beta blocker date and time   Airway Mallampati: II  TM Distance: >3 FB Neck ROM: full    Dental no notable dental hx.    Pulmonary neg pulmonary ROS, former smoker   Pulmonary exam normal breath sounds clear to auscultation       Cardiovascular Exercise Tolerance: Good hypertension, + DOE  negative cardio ROS + Valvular Problems/Murmurs  Rhythm:regular Rate:Normal     Neuro/Psych  PSYCHIATRIC DISORDERS  Depression     Neuromuscular disease negative neurological ROS  negative psych ROS   GI/Hepatic negative GI ROS, Neg liver ROS,GERD  ,,  Endo/Other  negative endocrine ROS    Renal/GU negative Renal ROS  negative genitourinary   Musculoskeletal   Abdominal   Peds  Hematology negative hematology ROS (+)   Anesthesia Other Findings   Reproductive/Obstetrics negative OB ROS                             Anesthesia Physical Anesthesia Plan  ASA: 2  Anesthesia Plan: General   Post-op Pain Management:    Induction:   PONV Risk Score and Plan: Propofol infusion  Airway Management Planned:   Additional Equipment:   Intra-op Plan:   Post-operative Plan:   Informed Consent: I have reviewed the patients History and Physical, chart, labs and discussed the procedure including the risks, benefits and alternatives for the proposed anesthesia with the patient or authorized representative who has indicated his/her understanding and acceptance.     Dental Advisory Given  Plan Discussed with: CRNA  Anesthesia Plan Comments:        Anesthesia Quick Evaluation

## 2023-06-06 NOTE — Interval H&P Note (Signed)
History and Physical Interval Note:  06/06/2023 7:22 AM  Colleen [name]  has presented today for surgery, with the diagnosis of right carpal tunnel syndrome.  The various methods of treatment have been discussed with the patient and family. After consideration of risks, benefits and other options for treatment, the patient has consented to  Procedure(s): CARPAL TUNNEL RELEASE (Right) as a surgical intervention.  The patient's history has been reviewed, patient examined, no change in status, stable for surgery.  I have reviewed the patient's chart and labs.  Questions were answered to the patient's satisfaction.     Fuller Canada

## 2023-06-07 ENCOUNTER — Encounter (HOSPITAL_COMMUNITY): Payer: Self-pay | Admitting: Orthopedic Surgery

## 2023-06-09 NOTE — Anesthesia Postprocedure Evaluation (Signed)
Anesthesia Post Note  Patient: Colleen [name]  Procedure(s) Performed: CARPAL TUNNEL RELEASE (Right: Hand)  Patient location during evaluation: Phase II Anesthesia Type: General Level of consciousness: awake Pain management: pain level controlled Vital Signs Assessment: post-procedure vital signs reviewed and stable Respiratory status: spontaneous breathing and respiratory function stable Cardiovascular status: blood pressure returned to baseline and stable Postop Assessment: no headache and no apparent nausea or vomiting Anesthetic complications: yes Comments: Late entry   Encounter Notable Events  Notable Event Outcome Phase Comment  Difficult to intubate - unexpected  Intraprocedure Filed from anesthesia note documentation.     Last Vitals:  Vitals:   06/06/23 0915 06/06/23 0929  BP: 134/60 138/65  Pulse: 77 84  Resp: 13 16  Temp: 36.6 C 36.6 C  SpO2: 93% 97%    Last Pain:  Vitals:   06/07/23 1039  TempSrc:   PainSc: 0-No pain                 Windell Norfolk

## 2023-06-20 NOTE — Progress Notes (Unsigned)
   There were no vitals taken for this visit.  There is no height or weight on file to calculate BMI.  No chief complaint on file.   Encounter Diagnosis  Name Primary?   Carpal tunnel syndrome of right wrist S/P CTR 06/06/23 Yes    DOI/DOS/ Date: 06/06/23  {CHL AMB ORT SYMPTOMS POST TREATMENT:21798}

## 2023-06-21 ENCOUNTER — Encounter: Payer: Medicare PPO | Admitting: Orthopedic Surgery

## 2023-06-22 ENCOUNTER — Encounter: Payer: Self-pay | Admitting: Orthopedic Surgery

## 2023-06-22 ENCOUNTER — Ambulatory Visit (INDEPENDENT_AMBULATORY_CARE_PROVIDER_SITE_OTHER): Payer: Medicare PPO | Admitting: Orthopedic Surgery

## 2023-06-22 DIAGNOSIS — G5601 Carpal tunnel syndrome, right upper limb: Secondary | ICD-10-CM

## 2023-06-22 NOTE — Progress Notes (Signed)
Postop appointment  Postop visit #1  Postop status post right carpal tunnel release  Date of surgery June 06, 2023  Postop day number 16  Complaints thumb is still numb some pain in the palm  Dressing change and suture removal.  All sutures removed.  Steri-Strips applied.  Bandage applied.  Patient has full range of motion in the wrist no signs of infection  Return in 5 weeks patient advised to work on range of motion exercises

## 2023-07-09 DIAGNOSIS — H01004 Unspecified blepharitis left upper eyelid: Secondary | ICD-10-CM | POA: Diagnosis not present

## 2023-07-09 DIAGNOSIS — H18413 Arcus senilis, bilateral: Secondary | ICD-10-CM | POA: Diagnosis not present

## 2023-07-09 DIAGNOSIS — H2513 Age-related nuclear cataract, bilateral: Secondary | ICD-10-CM | POA: Diagnosis not present

## 2023-07-09 DIAGNOSIS — H40053 Ocular hypertension, bilateral: Secondary | ICD-10-CM | POA: Diagnosis not present

## 2023-07-09 DIAGNOSIS — H01002 Unspecified blepharitis right lower eyelid: Secondary | ICD-10-CM | POA: Diagnosis not present

## 2023-07-09 DIAGNOSIS — H01001 Unspecified blepharitis right upper eyelid: Secondary | ICD-10-CM | POA: Diagnosis not present

## 2023-07-09 DIAGNOSIS — H402231 Chronic angle-closure glaucoma, bilateral, mild stage: Secondary | ICD-10-CM | POA: Diagnosis not present

## 2023-07-09 DIAGNOSIS — H01005 Unspecified blepharitis left lower eyelid: Secondary | ICD-10-CM | POA: Diagnosis not present

## 2023-07-18 ENCOUNTER — Encounter: Payer: Self-pay | Admitting: Family Medicine

## 2023-07-18 ENCOUNTER — Ambulatory Visit (INDEPENDENT_AMBULATORY_CARE_PROVIDER_SITE_OTHER): Payer: Medicare HMO | Admitting: Family Medicine

## 2023-07-18 VITALS — BP 148/70 | HR 69 | Temp 98.5°F | Resp 16 | Ht 62.0 in | Wt 136.4 lb

## 2023-07-18 DIAGNOSIS — F322 Major depressive disorder, single episode, severe without psychotic features: Secondary | ICD-10-CM | POA: Diagnosis not present

## 2023-07-18 DIAGNOSIS — I10 Essential (primary) hypertension: Secondary | ICD-10-CM

## 2023-07-18 DIAGNOSIS — J309 Allergic rhinitis, unspecified: Secondary | ICD-10-CM

## 2023-07-18 DIAGNOSIS — E782 Mixed hyperlipidemia: Secondary | ICD-10-CM | POA: Diagnosis not present

## 2023-07-18 MED ORDER — MONTELUKAST SODIUM 10 MG PO TABS: 10.0000 mg | ORAL_TABLET | Freq: Every day | ORAL | 1 refills | Status: AC

## 2023-07-18 MED ORDER — ESCITALOPRAM OXALATE 10 MG PO TABS: 10.0000 mg | ORAL_TABLET | Freq: Every day | ORAL | 2 refills | Status: DC

## 2023-07-18 NOTE — Patient Instructions (Addendum)
 Annual exam in 7 to  9 weeks re evaluate depression   TSH, lipid and hepatic panel today  Intentional meditation every morning and every night  It is important that you exercise regularly at least 30 minutes 5 times a week. If you develop chest pain, have severe difficulty breathing, or feel very tired, stop exercising immediately and seek medical attention    Need to commit to daily medication for depression, lexapro 10 mg is sent to your local pharmacy  Singulair/ montelukast is sent to mail order for allergies  Blood pressure is above normal today, if still elevated at nex visit, medication will be adjusted  Thanks for choosing [facility], we consider it a privelige to serve you.

## 2023-07-19 ENCOUNTER — Encounter: Payer: Self-pay | Admitting: Family Medicine

## 2023-07-19 LAB — HEPATIC FUNCTION PANEL
ALT: 30 IU/L (ref 0–32)
AST: 27 IU/L (ref 0–40)
Albumin: 4 g/dL (ref 3.8–4.8)
Alkaline Phosphatase: 100 IU/L (ref 44–121)
Bilirubin Total: 0.3 mg/dL (ref 0.0–1.2)
Bilirubin, Direct: 0.12 mg/dL (ref 0.00–0.40)
Total Protein: 6.7 g/dL (ref 6.0–8.5)

## 2023-07-19 LAB — LIPID PANEL
Chol/HDL Ratio: 3.1 ratio (ref 0.0–4.4)
Cholesterol, Total: 141 mg/dL (ref 100–199)
HDL: 46 mg/dL (ref >39)
LDL Chol Calc (NIH): 80 mg/dL (ref 0–99)
Triglycerides: 78 mg/dL (ref 0–149)
VLDL Cholesterol Cal: 15 mg/dL (ref 5–40)

## 2023-07-19 LAB — TSH: TSH: 1.37 u[IU]/mL (ref 0.450–4.500)

## 2023-07-27 ENCOUNTER — Encounter: Payer: Self-pay | Admitting: Orthopedic Surgery

## 2023-07-27 ENCOUNTER — Ambulatory Visit: Payer: Medicare PPO | Admitting: Orthopedic Surgery

## 2023-07-27 DIAGNOSIS — G5601 Carpal tunnel syndrome, right upper limb: Secondary | ICD-10-CM

## 2023-07-27 NOTE — Patient Instructions (Signed)
 Take ibuprofen 800 mg twice daily for the soreness and swelling

## 2023-07-27 NOTE — Progress Notes (Signed)
   There were no vitals taken for this visit.  There is no height or weight on file to calculate BMI.  Chief Complaint  Patient presents with   Post-op Follow-up    CTR right     No diagnosis found.  DOI/DOS/ Date: 06/06/23   Improved still tender at incision and some swelling but has improved

## 2023-07-27 NOTE — Progress Notes (Signed)
   There were no vitals taken for this visit.  There is no height or weight on file to calculate BMI.  Chief Complaint  Patient presents with   Post-op Follow-up    CTR right     Encounter Diagnosis  Name Primary?   Carpal tunnel syndrome of right wrist S/P CTR 06/06/23 Yes    DOI/DOS/ Date: 06/06/23   Improved still tender at incision and some swelling but has improved   And he has some tenderness actually in the distal aspect of the forearm.  The fingertips of the thumb index long and ring fingers still have some numbness remaining portion of the hand and fingers numbness has resolved  Recommend patient take ibuprofen twice a day  Follow-up in 4 to 6 weeks

## 2023-08-02 ENCOUNTER — Other Ambulatory Visit: Payer: Self-pay | Admitting: Family Medicine

## 2023-08-07 DIAGNOSIS — J309 Allergic rhinitis, unspecified: Secondary | ICD-10-CM | POA: Insufficient documentation

## 2023-08-07 NOTE — Assessment & Plan Note (Addendum)
 Sccore of 18 not suicidal or homicidal, alcoholic spouse is main problem Start daily medication, review in 6 to 8 week Declines therapy at this time

## 2023-08-07 NOTE — Assessment & Plan Note (Signed)
 DASH diet and commitment to daily physical activity for a minimum of 30 minutes discussed and encouraged, as a part of hypertension management. The importance of attaining a healthy weight is also discussed.     07/18/2023   10:40 AM 07/18/2023    9:56 AM 06/06/2023    9:29 AM 06/06/2023    9:15 AM 06/06/2023    9:00 AM 06/06/2023    8:45 AM 06/06/2023    8:30 AM  BP/Weight  Systolic BP 148 144 138 134 134 121 100  Diastolic BP 70 78 65 60 61 62 89  Wt. (Lbs)  136.4       BMI  24.95 kg/m2          ELEVATED  at visit , reports poor sleep and stress , no med chane, lifestyle  change and re eval

## 2023-08-07 NOTE — Assessment & Plan Note (Signed)
 Increased and uncontrolled symptoms, commit to daily singulair

## 2023-08-07 NOTE — Assessment & Plan Note (Signed)
 Hyperlipidemia:Low fat diet discussed and encouraged.   Lipid Panel  Lab Results  Component Value Date   CHOL 141 07/18/2023   HDL 46 07/18/2023   LDLCALC 80 07/18/2023   TRIG 78 07/18/2023   CHOLHDL 3.1 07/18/2023     Controlled, no change in medication

## 2023-08-07 NOTE — Progress Notes (Signed)
   Colleen [name]     MRN: [id number]      DOB: 05-15-1945  Chief Complaint  Patient presents with   Cough    dry cough and sneezing x 3 weeks. Has tried Coricidin hbp.     HPI Colleen [name] is here for follow up and re-evaluation of chronic medical conditions, medication management and review of any available recent lab and radiology data.  Preventive health is updated, specifically  Cancer screening and Immunization.   Questions or concerns regarding consultations or procedures which the PT has had in the interim are  addressed. The PT denies any adverse reactions to current medications since the last visit.  Above concerns as stated Increased stress and depression as spouse continues to be controlled by alcohol ROS Denies recent fever or chills. Denies , ear pain or sore throat. Denies chest pains, palpitations and leg swelling Denies abdominal pain, nausea, vomiting,diarrhea or constipation.   Denies dysuria, frequency, hesitancy or incontinence. Denies joint pain, swelling and limitation in mobility. Denies headaches, seizures, numbness, or tingling. Denies skin break down or rash.   PE  BP (!) 148/70   Pulse 69   Temp 98.5 F (36.9 C) (Oral)   Resp 16   Ht 5\' 2"  (1.575 m)   Wt 136 lb 6.4 oz (61.9 kg)   SpO2 96%   BMI 24.95 kg/m   Patient alert and oriented and in no cardiopulmonary distress.  HEENT: No facial asymmetry, EOMI,     Neck supple .Nasal congestion, no sinus tenderness  Chest: Clear to auscultation bilaterally.  CVS: S1, S2 no murmurs, no S3.Regular rate.  ABD: Soft non tender.   Ext: No edema  MS: Adequate ROM spine, shoulders, hips and knees.  Skin: Intact, no ulcerations or rash noted.  Psych: Good eye contact, normal affect. Memory intact not anxious or depressed appearing.  CNS: CN 2-12 intact, power,  normal throughout.no focal deficits noted.   Assessment & Plan  Depression, major, single episode, severe (HCC) Sccore of 18 not suicidal  or homicidal, alcoholic spouse is main problem Start daily medication, review in 6 to 8 week Declines therapy at this time  Essential hypertension DASH diet and commitment to daily physical activity for a minimum of 30 minutes discussed and encouraged, as a part of hypertension management. The importance of attaining a healthy weight is also discussed.     07/18/2023   10:40 AM 07/18/2023    9:56 AM 06/06/2023    9:29 AM 06/06/2023    9:15 AM 06/06/2023    9:00 AM 06/06/2023    8:45 AM 06/06/2023    8:30 AM  BP/Weight  Systolic BP 148 144 138 134 134 121 100  Diastolic BP 70 78 65 60 61 62 89  Wt. (Lbs)  136.4       BMI  24.95 kg/m2          ELEVATED  at visit , reports poor sleep and stress , no med chane, lifestyle  change and re eval   ALLERGIC RHINITIS, SEASONAL Increased and uncontrolled symptoms, commit to daily singulair  Hyperlipidemia Hyperlipidemia:Low fat diet discussed and encouraged.   Lipid Panel  Lab Results  Component Value Date   CHOL 141 07/18/2023   HDL 46 07/18/2023   LDLCALC 80 07/18/2023   TRIG 78 07/18/2023   CHOLHDL 3.1 07/18/2023     Controlled, no change in medication

## 2023-08-08 ENCOUNTER — Encounter

## 2023-08-16 DIAGNOSIS — H402231 Chronic angle-closure glaucoma, bilateral, mild stage: Secondary | ICD-10-CM | POA: Diagnosis not present

## 2023-08-16 DIAGNOSIS — H2513 Age-related nuclear cataract, bilateral: Secondary | ICD-10-CM | POA: Diagnosis not present

## 2023-08-16 DIAGNOSIS — H40053 Ocular hypertension, bilateral: Secondary | ICD-10-CM | POA: Diagnosis not present

## 2023-08-16 DIAGNOSIS — H01001 Unspecified blepharitis right upper eyelid: Secondary | ICD-10-CM | POA: Diagnosis not present

## 2023-08-23 ENCOUNTER — Ambulatory Visit (INDEPENDENT_AMBULATORY_CARE_PROVIDER_SITE_OTHER): Admitting: Orthopedic Surgery

## 2023-08-23 DIAGNOSIS — G5601 Carpal tunnel syndrome, right upper limb: Secondary | ICD-10-CM

## 2023-08-23 NOTE — Progress Notes (Signed)
   There were no vitals taken for this visit.  There is no height or weight on file to calculate BMI.  Chief Complaint  Patient presents with   Follow-up    Recheck on right CTR, DOS 06/06/23.     Encounter Diagnosis  Name Primary?   Carpal tunnel syndrome of right wrist S/P CTR 06/06/23 Yes    DOI/DOS/ Date:   06/06/23    Improved

## 2023-08-23 NOTE — Progress Notes (Signed)
 Patient ID: Colleen [name], female   DOB: 15-Aug-1945, 78 y.o.   MRN: [id number]  Encounter Diagnosis  Name Primary?   Carpal tunnel syndrome of right wrist S/P CTR 06/06/23 Yes    Chief Complaint  Patient presents with   Follow-up    Recheck on right CTR, DOS 06/06/23.     Colleen [name] is postop from her right carpal tunnel release she has made significant improvements has full range of motion except full opposition of the thumb to the small finger she says her night pain has resolved with the surgery.  She has a little bit of numbness at the fingertips but it is tolerable and she was discharged

## 2023-09-03 DIAGNOSIS — H2512 Age-related nuclear cataract, left eye: Secondary | ICD-10-CM | POA: Diagnosis not present

## 2023-09-04 NOTE — H&P (Signed)
 Surgical History & Physical  Patient Name: Colleen [name]  DOB: Oct 11, 1945  Surgery: Cataract extraction with intraocular lens implant phacoemulsification; Left Eye Surgeon: Tarri Farm MD Surgery Date: 09/07/2023  Pre-Op Date: 08/16/2023  HPI: A 56 Yr. old female patient 1. The patient is returning for a cataract follow-up of both eyes. Since the last visit, the affected area is tolerating. The patients vision is blurry. The conditions severity is constant. The complaint is associated with difficulty with small print on medicine bottles/labels, filling out forms, and difficulty with hazy and blurred vision. This is negatively affecting the patient's quality of life and the patient is unable to function adequately in life with the current level of vision. HPI was performed by Tarri Farm .  Medical History: Glaucoma Cataracts  Arthritis  Review of Systems Elevated cholesterol High Blood Pressure Negative Allergic/Immunologic Negative Cardiovascular Negative Constitutional Negative Ear, Nose, Mouth & Throat Negative Endocrine Glaucoma, cataracts Eyes Negative Gastrointestinal Negative Genitourinary Negative Hematologic/Lymphatic Negative Integumentary Negative Musculoskeletal Negative Neurological Negative Psychiatry Negative Respiratory  Social Former smoker  Medication Artifical Tears, Latanoprost,  Alendronate , Calcium , Centrum Silver, Fluticasone , Lisinopril , Omeprazole , Pravastatin , Acetaminophen ,  amlodipine  ,  lisinopril -hydrochlorothiazide  ,  rosuvastatin  ,  latanoprost , Bimatoprost, Travoprost, Vyzulta  Sx/Procedures Right Eye Structure, SLT, SLT, Laser Trabeculoplasty, Laser Trabeculoplasty  Drug Allergies  NKDA  History & Physical: Heent: cataracts NECK: supple without bruits LUNGS: lungs clear to auscultation CV: regular rate and rhythm Abdomen: soft and non-tender  Impression & Plan: Assessment: 1.  NUCLEAR SCLEROSIS AGE RELATED; Both Eyes  (H25.13) 2.  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  Ocular Hypertension; Both Eyes (H40.053) 4.  BLEPHARITIS; Right Upper Lid, Right Lower Lid, Left Upper Lid, Left Lower Lid (H01.001, H01.002,H01.004,H01.005) 5.  ARCUS SENILIS; Both Eyes (H18.413)  Plan: 1.  Cataract accounts for the patient's decreased vision. This visual impairment is not correctable with a tolerable change in glasses or contact lenses. Cataract surgery with an implantation of a new lens should significantly improve the visual and functional status of the patient. Discussed all risks, benefits, alternatives, and potential complications. Discussed the procedures and recovery. Patient desires to have surgery. A-scan ordered and performed today for intra-ocular lens calculations. The surgery will be performed in order to improve vision for driving, reading, and for eye examinations. Patient also has occludable angles, left eye worse than right eye. Recommend cataract surgery to resolve this issue and greatly reduce the chance of acute angle glaucoma. Recommend phacoemulsification with intra-ocular lens. Recommend Dextenza  for post-operative pain and inflammation. Left eye more narrow - first. Dilates poorly - shugarcaine by protocol. Malyugin Ring. Omidira.  2.  With occludable angles. Recommend cataract surgery to improve vision and relieve occludable narrow angles. As above.  IOPs above goal OU today.  3.  S/P SLT OU in 2017. S/P repeat SLT OD 1/22, OS 2/22  IOP at/below goal OD, above goal OS.  OCT rNFL WNL today OU 11/20/22. HVF 24-2 shows non-specific defects, but otherwise normal OU 1/25  Continue Vyzulta 1 drop every night at bedtime, both eyes  Longitudinal care considerations were addressed as part of this visit. Time was spent with patient discussing the importance of following medical care instructions and recommendations to help achieve positive long-term outcomes.  Detailed discussion  about glaucoma today including importance of maintaining good follow up and following treatment plan, and the possibility of irreversible blindness as part of this disease process.  4.  Recommend regular lid cleaning.  5.  Discussed significance  of finding Answered patient questions about finding

## 2023-09-05 ENCOUNTER — Encounter (HOSPITAL_COMMUNITY)
Admission: RE | Admit: 2023-09-05 | Discharge: 2023-09-05 | Disposition: A | Discharge status: Home / Self Care | Source: Ambulatory Visit | Attending: Ophthalmology | Admitting: Ophthalmology

## 2023-09-06 ENCOUNTER — Encounter (HOSPITAL_COMMUNITY): Payer: Self-pay

## 2023-09-07 ENCOUNTER — Ambulatory Visit (HOSPITAL_BASED_OUTPATIENT_CLINIC_OR_DEPARTMENT_OTHER): Admitting: Anesthesiology

## 2023-09-07 ENCOUNTER — Encounter (HOSPITAL_COMMUNITY)
Admission: RE | Disposition: A | Discharge status: Home / Self Care | Payer: Self-pay | Source: Home / Self Care | Attending: Ophthalmology

## 2023-09-07 ENCOUNTER — Encounter (HOSPITAL_COMMUNITY): Payer: Self-pay | Admitting: Ophthalmology

## 2023-09-07 ENCOUNTER — Ambulatory Visit (HOSPITAL_COMMUNITY): Admitting: Anesthesiology

## 2023-09-07 ENCOUNTER — Ambulatory Visit (HOSPITAL_COMMUNITY)
Admission: RE | Admit: 2023-09-07 | Discharge: 2023-09-07 | Disposition: A | Discharge status: Home / Self Care | Attending: Ophthalmology | Admitting: Ophthalmology

## 2023-09-07 ENCOUNTER — Other Ambulatory Visit: Payer: Self-pay

## 2023-09-07 DIAGNOSIS — I10 Essential (primary) hypertension: Secondary | ICD-10-CM | POA: Insufficient documentation

## 2023-09-07 DIAGNOSIS — H2513 Age-related nuclear cataract, bilateral: Secondary | ICD-10-CM | POA: Diagnosis not present

## 2023-09-07 DIAGNOSIS — Z79899 Other long term (current) drug therapy: Secondary | ICD-10-CM | POA: Insufficient documentation

## 2023-09-07 DIAGNOSIS — H0100A Unspecified blepharitis right eye, upper and lower eyelids: Secondary | ICD-10-CM | POA: Insufficient documentation

## 2023-09-07 DIAGNOSIS — H18413 Arcus senilis, bilateral: Secondary | ICD-10-CM | POA: Insufficient documentation

## 2023-09-07 DIAGNOSIS — K219 Gastro-esophageal reflux disease without esophagitis: Secondary | ICD-10-CM | POA: Diagnosis not present

## 2023-09-07 DIAGNOSIS — H0100B Unspecified blepharitis left eye, upper and lower eyelids: Secondary | ICD-10-CM | POA: Diagnosis not present

## 2023-09-07 DIAGNOSIS — H40053 Ocular hypertension, bilateral: Secondary | ICD-10-CM | POA: Diagnosis not present

## 2023-09-07 DIAGNOSIS — H5712 Ocular pain, left eye: Secondary | ICD-10-CM | POA: Diagnosis not present

## 2023-09-07 DIAGNOSIS — R011 Cardiac murmur, unspecified: Secondary | ICD-10-CM | POA: Diagnosis not present

## 2023-09-07 DIAGNOSIS — Z87891 Personal history of nicotine dependence: Secondary | ICD-10-CM | POA: Diagnosis not present

## 2023-09-07 DIAGNOSIS — H2512 Age-related nuclear cataract, left eye: Secondary | ICD-10-CM

## 2023-09-07 DIAGNOSIS — H402231 Chronic angle-closure glaucoma, bilateral, mild stage: Secondary | ICD-10-CM | POA: Insufficient documentation

## 2023-09-07 HISTORY — PX: INSERTION, STENT, DRUG-ELUTING, LACRIMAL CANALICULUS: SHX7453

## 2023-09-07 HISTORY — PX: CATARACT EXTRACTION W/PHACO: SHX586

## 2023-09-07 SURGERY — PHACOEMULSIFICATION, CATARACT, WITH IOL INSERTION
Anesthesia: Monitor Anesthesia Care | Site: Eye | Laterality: Left

## 2023-09-07 MED ORDER — PHENYLEPHRINE-KETOROLAC 1-0.3 % IO SOLN
INTRAOCULAR | Status: DC | PRN
  Administered 2023-09-07: 500 mL via OPHTHALMIC

## 2023-09-07 MED ORDER — LIDOCAINE HCL 3.5 % OP GEL: 1.0000 | Freq: Once | OPHTHALMIC | Status: DC

## 2023-09-07 MED ORDER — MOXIFLOXACIN HCL 5 MG/ML IO SOLN
INTRAOCULAR | Status: DC | PRN
  Administered 2023-09-07: .2 mL via INTRACAMERAL

## 2023-09-07 MED ORDER — POVIDONE-IODINE 5 % OP SOLN
OPHTHALMIC | Status: DC | PRN
  Administered 2023-09-07: 1 via OPHTHALMIC

## 2023-09-07 MED ORDER — BSS IO SOLN
INTRAOCULAR | Status: DC | PRN
  Administered 2023-09-07: 15 mL via INTRAOCULAR

## 2023-09-07 MED ORDER — STERILE WATER FOR IRRIGATION IR SOLN
Status: DC | PRN
  Administered 2023-09-07: 250 mL

## 2023-09-07 MED ORDER — SODIUM HYALURONATE 10 MG/ML IO SOLUTION
PREFILLED_SYRINGE | INTRAOCULAR | Status: DC | PRN
  Administered 2023-09-07: .85 mL via INTRAOCULAR

## 2023-09-07 MED ORDER — MIDAZOLAM HCL 2 MG/2ML IJ SOLN
INTRAMUSCULAR | Status: AC
  Filled 2023-09-07: qty 2

## 2023-09-07 MED ORDER — DEXAMETHASONE 0.4 MG OP INST
VAGINAL_INSERT | OPHTHALMIC | Status: DC | PRN
  Administered 2023-09-07: .4 mg via OPHTHALMIC

## 2023-09-07 MED ORDER — PHENYLEPHRINE HCL 2.5 % OP SOLN
1.0000 [drp] | OPHTHALMIC | Status: AC | PRN
  Administered 2023-09-07 (×3): 1 [drp] via OPHTHALMIC

## 2023-09-07 MED ORDER — TROPICAMIDE 1 % OP SOLN
1.0000 [drp] | OPHTHALMIC | Status: AC | PRN
  Administered 2023-09-07 (×3): 1 [drp] via OPHTHALMIC

## 2023-09-07 MED ORDER — LIDOCAINE HCL (PF) 1 % IJ SOLN
INTRAOCULAR | Status: DC | PRN
  Administered 2023-09-07: 1 mL via OPHTHALMIC

## 2023-09-07 MED ORDER — SODIUM HYALURONATE 23MG/ML IO SOSY
PREFILLED_SYRINGE | INTRAOCULAR | Status: DC | PRN
  Administered 2023-09-07: .6 mL via INTRAOCULAR

## 2023-09-07 MED ORDER — TETRACAINE HCL 0.5 % OP SOLN
1.0000 [drp] | OPHTHALMIC | Status: AC | PRN
  Administered 2023-09-07 (×3): 1 [drp] via OPHTHALMIC

## 2023-09-07 SURGICAL SUPPLY — 12 items
CATARACT SUITE SIGHTPATH (MISCELLANEOUS) ×1 IMPLANT
CLOTH BEACON ORANGE TIMEOUT ST (SAFETY) ×1 IMPLANT
EYE SHIELD UNIVERSAL CLEAR (GAUZE/BANDAGES/DRESSINGS) IMPLANT
FEE CATARACT SUITE SIGHTPATH (MISCELLANEOUS) ×1 IMPLANT
GLOVE BIOGEL PI IND STRL 7.0 (GLOVE) ×2 IMPLANT
LENS IOL TECNIS EYHANCE 24.5 (Intraocular Lens) IMPLANT
NDL HYPO 18GX1.5 BLUNT FILL (NEEDLE) ×1 IMPLANT
NEEDLE HYPO 18GX1.5 BLUNT FILL (NEEDLE) ×1 IMPLANT
PAD ARMBOARD POSITIONER FOAM (MISCELLANEOUS) ×1 IMPLANT
SYR TB 1ML LL NO SAFETY (SYRINGE) ×1 IMPLANT
TAPE SURG TRANSPORE 1 IN (GAUZE/BANDAGES/DRESSINGS) IMPLANT
WATER STERILE IRR 250ML POUR (IV SOLUTION) ×1 IMPLANT

## 2023-09-07 NOTE — Anesthesia Postprocedure Evaluation (Signed)
 Anesthesia Post Note  Patient: Colleen [name]  Procedure(s) Performed: PHACOEMULSIFICATION, CATARACT, WITH IOL INSERTION (Left: Eye) INSERTION, STENT, DRUG-ELUTING, LACRIMAL CANALICULUS (Left: Eye)  Patient location during evaluation: Phase II Anesthesia Type: MAC Level of consciousness: awake and alert Pain management: pain level controlled Vital Signs Assessment: post-procedure vital signs reviewed and stable Respiratory status: spontaneous breathing, nonlabored ventilation and respiratory function stable Cardiovascular status: stable and blood pressure returned to baseline Postop Assessment: no apparent nausea or vomiting Anesthetic complications: no   There were no known notable events for this encounter.   Last Vitals:  Vitals:   09/07/23 1219 09/07/23 1314  BP: (!) 125/57 (!) 133/56  Pulse: 62 62  Resp: 17 10  Temp: 36.7 C 36.7 C  SpO2: (!) 57% 100%    Last Pain:  Vitals:   09/07/23 1314  TempSrc: Oral  PainSc: 0-No pain                 Rossana Molchan L Joyce Heitman

## 2023-09-07 NOTE — Discharge Instructions (Addendum)
 Please discharge patient when stable, will follow up today with Dr. June Leap at the [facility] office immediately following discharge.  Leave shield in place until visit.  All paperwork with discharge instructions will be given at the office.  [facility] Address:  [address]  [place], [place] [address]

## 2023-09-07 NOTE — Transfer of Care (Signed)
 Immediate Anesthesia Transfer of Care Note  Patient: Colleen [name]  Procedure(s) Performed: PHACOEMULSIFICATION, CATARACT, WITH IOL INSERTION (Left: Eye) INSERTION, STENT, DRUG-ELUTING, LACRIMAL CANALICULUS (Left: Eye)  Patient Location: Short Stay  Anesthesia Type:MAC  Level of Consciousness: awake, alert , and oriented  Airway & Oxygen Therapy: Patient Spontanous Breathing  Post-op Assessment: Report given to RN and Post -op Vital signs reviewed and stable  Post vital signs: Reviewed and stable  Last Vitals:  Vitals Value Taken Time  BP    Temp    Pulse    Resp    SpO2      Last Pain:  Vitals:   09/07/23 1219  TempSrc: Oral  PainSc: 0-No pain         Complications: No notable events documented.

## 2023-09-07 NOTE — Anesthesia Procedure Notes (Signed)
 Procedure Name: MAC Date/Time: 09/07/2023 12:50 PM  Performed by: Sherwin Donate, CRNAPre-anesthesia Checklist: Patient identified, Emergency Drugs available, Suction available and Patient being monitored Patient Re-evaluated:Patient Re-evaluated prior to induction Oxygen Delivery Method: Nasal cannula Placement Confirmation: positive ETCO2

## 2023-09-07 NOTE — Op Note (Signed)
 Date of procedure: 09/07/23  Pre-operative diagnosis:  Visually significant age-related nuclear cataract, Left Eye (H25.12)  Post-operative diagnosis:   1. Visually significant age-related nuclear cataract, Left Eye (H25.12) 2. Pain and inflammation following cataract surgery Left Eye (H57.12)  Procedure:  Removal of cataract via phacoemulsification and insertion of intra-ocular lens [employer] DIB00 +24.5D into the capsular bag of the Left Eye 2. Placement of Dextenza  insert, Left Eye  Attending surgeon: Pleas Brill. Karington Zarazua, MD, MA  Anesthesia: MAC, Topical Akten  Complications: None  Estimated Blood Loss: <68mL (minimal)  Specimens: None  Implants: As above  Indications:  Visually significant age-related cataract, Left Eye  Procedure:  The patient was seen and identified in the pre-operative area. The operative eye was identified and dilated.  The operative eye was marked.  Topical anesthesia was administered to the operative eye.     The patient was then to the operative suite and placed in the supine position.  A timeout was performed confirming the patient, procedure to be performed, and all other relevant information.   The patient's face was prepped and draped in the usual fashion for intra-ocular surgery.  A lid speculum was placed into the operative eye and the surgical microscope moved into place and focused.  An inferotemporal paracentesis was created using a 20 gauge paracentesis blade.  Shugarcaine was injected into the anterior chamber.  Viscoelastic was injected into the anterior chamber.  A temporal clear-corneal main wound incision was created using a 2.77mm microkeratome.  A continuous curvilinear capsulorrhexis was initiated using an irrigating cystitome and completed using capsulorrhexis forceps.  Hydrodissection and hydrodeliniation were performed.  Viscoelastic was injected into the anterior chamber.  A phacoemulsification handpiece and a chopper as a second  instrument were used to remove the nucleus and epinucleus. The irrigation/aspiration handpiece was used to remove any remaining cortical material.   The capsular bag was reinflated with viscoelastic, checked, and found to be intact.  The intraocular lens was inserted into the capsular bag.  The irrigation/aspiration handpiece was used to remove any remaining viscoelastic.  The clear corneal wound and paracentesis wounds were then hydrated and checked with Weck-Cels to be watertight. 0.1mL of moxifloxacin was injected into the anterior chamber.  The lid-speculum was removed. The lower punctum was dilated. A Dextenza  implant was placed in the lower canaliculus without complication.  The drape was removed.  The patient's face was cleaned with a wet and dry 4x4. A clear shield was taped over the eye. The patient was taken to the post-operative care unit in good condition, having tolerated the procedure well.  Post-Op Instructions: The patient will follow up at [facility] for a same day post-operative evaluation and will receive all other orders and instructions.

## 2023-09-07 NOTE — Interval H&P Note (Signed)
 History and Physical Interval Note:  09/07/2023 12:46 PM  Colleen [name]  has presented today for surgery, with the diagnosis of nuclear sclerotic cataract, left eye.  The various methods of treatment have been discussed with the patient and family. After consideration of risks, benefits and other options for treatment, the patient has consented to  Procedure(s) with comments: PHACOEMULSIFICATION, CATARACT, WITH IOL INSERTION (Left) - CDE: INSERTION, STENT, DRUG-ELUTING, LACRIMAL CANALICULUS (Left) as a surgical intervention.  The patient's history has been reviewed, patient examined, no change in status, stable for surgery.  I have reviewed the patient's chart and labs.  Questions were answered to the patient's satisfaction.     Tarri Farm

## 2023-09-07 NOTE — Anesthesia Preprocedure Evaluation (Signed)
 Anesthesia Evaluation  Patient identified by MRN, date of birth, ID band Patient awake    Reviewed: Allergy & Precautions, H&P , NPO status , Patient's Chart, lab work & pertinent test results, reviewed documented beta blocker date and time   Airway Mallampati: II  TM Distance: >3 FB Neck ROM: full    Dental no notable dental hx. (+) Dental Advisory Given, Teeth Intact   Pulmonary neg pulmonary ROS, former smoker   Pulmonary exam normal breath sounds clear to auscultation       Cardiovascular Exercise Tolerance: Good hypertension, + DOE  Normal cardiovascular exam+ Valvular Problems/Murmurs  Rhythm:regular Rate:Normal     Neuro/Psych  PSYCHIATRIC DISORDERS  Depression    glaucoma  Neuromuscular disease    GI/Hepatic Neg liver ROS,GERD  ,,  Endo/Other  negative endocrine ROS    Renal/GU negative Renal ROS  negative genitourinary   Musculoskeletal  (+) Arthritis , Osteoarthritis,    Abdominal   Peds  Hematology negative hematology ROS (+)   Anesthesia Other Findings   Reproductive/Obstetrics negative OB ROS                             Anesthesia Physical Anesthesia Plan  ASA: 2  Anesthesia Plan: MAC   Post-op Pain Management: Minimal or no pain anticipated   Induction:   PONV Risk Score and Plan:   Airway Management Planned: Nasal Cannula and Natural Airway  Additional Equipment: None  Intra-op Plan:   Post-operative Plan:   Informed Consent: I have reviewed the patients History and Physical, chart, labs and discussed the procedure including the risks, benefits and alternatives for the proposed anesthesia with the patient or authorized representative who has indicated his/her understanding and acceptance.     Dental Advisory Given  Plan Discussed with: CRNA  Anesthesia Plan Comments:        Anesthesia Quick Evaluation

## 2023-09-10 ENCOUNTER — Encounter (HOSPITAL_COMMUNITY): Payer: Self-pay | Admitting: Ophthalmology

## 2023-09-13 DIAGNOSIS — H2511 Age-related nuclear cataract, right eye: Secondary | ICD-10-CM | POA: Diagnosis not present

## 2023-09-17 ENCOUNTER — Encounter (HOSPITAL_COMMUNITY)
Admission: RE | Admit: 2023-09-17 | Discharge: 2023-09-17 | Disposition: A | Discharge status: Home / Self Care | Source: Ambulatory Visit | Attending: Ophthalmology | Admitting: Ophthalmology

## 2023-09-17 NOTE — H&P (Signed)
 Surgical History & Physical  Patient Name: Colleen [name]  DOB: [birth year]  Surgery: Cataract extraction with intraocular lens implant phacoemulsification; Right Eye Surgeon: Tarri Farm MD Surgery Date: 09/21/2023  Pre-Op Date: 09/13/2023  HPI: A 3 Yr. old female patient 1.  The patient is returning for a cataract follow-up of the left eye. Since the last visit, the affected area is doing well. The patient's vision is improved. The condition's severity is constant. Patient is following medication instructions. Pt. is ready to proceed with cataract surgery on OD, due to blurred vision. Patient has trouble seeing street signs and has glare at night. This is negatively affecting the patient's quality of life and the patient is unable to function adequately in life with the current level of vision. HPI Completed by Dr. Tarri Farm  Medical History: Glaucoma Cataracts Ocular Hypertension  Arthritis Elevated cholesterol High Blood Pressure  Review of Systems Negative Allergic/Immunologic Hypertension Cardiovascular Negative Constitutional Negative Ear, Nose, Mouth & Throat Negative Endocrine Cataract, glaucoma Eyes Negative Gastrointestinal Negative Genitourinary Negative Hemotologic/Lymphatic Negative Integumentary Arthritis Musculoskeletal Negative Neurological Negative Psychiatry Negative Respiratory  Social Former smoker   Medication Artifical Tears, Latanoprost, Prednisolone-moxiflox-bromfen,  Alendronate , Calcium , Centrum Silver, Fluticasone , Lisinopril , Omeprazole , Pravastatin , Acetaminophinen,  amlodipine  ,  lisinopril -hydrochlorothiazide  ,  rosuvastatin , Vyzulta  Sx/Procedures Ocular Other Right Eye Structure, SLT, SLT, Laser Trabeculoplasty, Laser Trabeculoplasty, Phaco c IOL OS-dextenza    Drug Allergies  NKDA  History & Physical: Heent: cataract, glaucoma  NECK: supple without bruits LUNGS: lungs clear to auscultation CV: regular rate and rhythm Abdomen:  soft and non-tender  Impression & Plan: Assessment: 1.  CATARACT EXTRACTION STATUS; Left Eye (Z98.42) 2.  NUCLEAR SCLEROSIS AGE RELATED; , Right Eye (H25.11) 3.  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  1 week after cataract surgery. Doing well with improved vision and normal eye pressure. Call with any problems or concerns. Stop drops - Dextenza . Call with any concerning symptoms.  2.  Cataract accounts for the patient's decreased vision. This visual impairment is not correctable with a tolerable change in glasses or contact lenses. Cataract surgery with an implantation of a new lens should significantly improve the visual and functional status of the patient. Discussed all risks, benefits, alternatives, and potential complications. Discussed the procedures and recovery. Patient desires to have surgery. A-scan ordered and performed today for intra-ocular lens calculations. The surgery will be performed in order to improve vision for driving, reading, and for eye examinations. Recommend phacoemulsification with intra-ocular lens. Recommend Dextenza  for post-operative pain and inflammation. Right Eye. Surgery required to correct imbalance of vision. Dilates well - shugarcaine by protocol.  3.  With occludable angles. Recommend cataract surgery to improve vision and relieve occludable narrow angles. As above.  IOPs at goal OU today.

## 2023-09-21 ENCOUNTER — Ambulatory Visit (HOSPITAL_COMMUNITY)
Admission: RE | Admit: 2023-09-21 | Discharge: 2023-09-21 | Disposition: A | Discharge status: Home / Self Care | Attending: Ophthalmology | Admitting: Ophthalmology

## 2023-09-21 ENCOUNTER — Ambulatory Visit (HOSPITAL_BASED_OUTPATIENT_CLINIC_OR_DEPARTMENT_OTHER): Admitting: Certified Registered"

## 2023-09-21 ENCOUNTER — Encounter (HOSPITAL_COMMUNITY)
Admission: RE | Disposition: A | Discharge status: Home / Self Care | Payer: Self-pay | Source: Home / Self Care | Attending: Ophthalmology

## 2023-09-21 ENCOUNTER — Ambulatory Visit (HOSPITAL_COMMUNITY): Admitting: Certified Registered"

## 2023-09-21 ENCOUNTER — Encounter (HOSPITAL_COMMUNITY): Payer: Self-pay | Admitting: Ophthalmology

## 2023-09-21 DIAGNOSIS — I10 Essential (primary) hypertension: Secondary | ICD-10-CM | POA: Insufficient documentation

## 2023-09-21 DIAGNOSIS — F32A Depression, unspecified: Secondary | ICD-10-CM | POA: Diagnosis not present

## 2023-09-21 DIAGNOSIS — K219 Gastro-esophageal reflux disease without esophagitis: Secondary | ICD-10-CM | POA: Diagnosis not present

## 2023-09-21 DIAGNOSIS — H2511 Age-related nuclear cataract, right eye: Secondary | ICD-10-CM | POA: Diagnosis not present

## 2023-09-21 DIAGNOSIS — Z87891 Personal history of nicotine dependence: Secondary | ICD-10-CM | POA: Insufficient documentation

## 2023-09-21 DIAGNOSIS — H5711 Ocular pain, right eye: Secondary | ICD-10-CM | POA: Insufficient documentation

## 2023-09-21 HISTORY — PX: INSERTION, IMPLANT, DEXAMETHASONE ELUTING, LACRIMAL CANALICULUS: SHX7602

## 2023-09-21 HISTORY — PX: CATARACT EXTRACTION W/PHACO: SHX586

## 2023-09-21 SURGERY — PHACOEMULSIFICATION, CATARACT, WITH IOL INSERTION
Anesthesia: Monitor Anesthesia Care | Site: Eye | Laterality: Right

## 2023-09-21 MED ORDER — BSS IO SOLN
INTRAOCULAR | Status: DC | PRN
  Administered 2023-09-21: 15 mL via INTRAOCULAR

## 2023-09-21 MED ORDER — SODIUM CHLORIDE 0.9% FLUSH: 3.0000 mL | INTRAVENOUS | Status: DC | PRN

## 2023-09-21 MED ORDER — PHENYLEPHRINE HCL 2.5 % OP SOLN
1.0000 [drp] | OPHTHALMIC | Status: AC | PRN
  Administered 2023-09-21 (×3): 1 [drp] via OPHTHALMIC

## 2023-09-21 MED ORDER — MIDAZOLAM HCL 2 MG/2ML IJ SOLN
INTRAMUSCULAR | Status: DC | PRN
  Administered 2023-09-21: 1 mg via INTRAVENOUS

## 2023-09-21 MED ORDER — TROPICAMIDE 1 % OP SOLN
1.0000 [drp] | OPHTHALMIC | Status: AC | PRN
  Administered 2023-09-21 (×3): 1 [drp] via OPHTHALMIC

## 2023-09-21 MED ORDER — DEXAMETHASONE 0.4 MG OP INST
VAGINAL_INSERT | OPHTHALMIC | Status: AC
  Filled 2023-09-21: qty 1

## 2023-09-21 MED ORDER — EPINEPHRINE PF 1 MG/ML IJ SOLN
INTRAOCULAR | Status: DC | PRN
  Administered 2023-09-21: 500 mL

## 2023-09-21 MED ORDER — POVIDONE-IODINE 5 % OP SOLN
OPHTHALMIC | Status: DC | PRN
  Administered 2023-09-21: 1 via OPHTHALMIC

## 2023-09-21 MED ORDER — MIDAZOLAM HCL 2 MG/2ML IJ SOLN
INTRAMUSCULAR | Status: AC
  Filled 2023-09-21: qty 2

## 2023-09-21 MED ORDER — DEXAMETHASONE 0.4 MG OP INST
VAGINAL_INSERT | OPHTHALMIC | Status: DC | PRN
  Administered 2023-09-21: .4 mg via OPHTHALMIC

## 2023-09-21 MED ORDER — SODIUM CHLORIDE 0.9% FLUSH
INTRAVENOUS | Status: DC | PRN
  Administered 2023-09-21: 7 mL via INTRAVENOUS

## 2023-09-21 MED ORDER — LIDOCAINE HCL 3.5 % OP GEL
1.0000 | Freq: Once | OPHTHALMIC | Status: AC
  Administered 2023-09-21: 1 via OPHTHALMIC

## 2023-09-21 MED ORDER — SODIUM CHLORIDE 0.9% FLUSH: 3.0000 mL | Freq: Two times a day (BID) | INTRAVENOUS | Status: DC

## 2023-09-21 MED ORDER — STERILE WATER FOR IRRIGATION IR SOLN
Status: DC | PRN
  Administered 2023-09-21: 1

## 2023-09-21 MED ORDER — SODIUM HYALURONATE 10 MG/ML IO SOLUTION
PREFILLED_SYRINGE | INTRAOCULAR | Status: DC | PRN
  Administered 2023-09-21: .85 mL via INTRAOCULAR

## 2023-09-21 MED ORDER — TETRACAINE HCL 0.5 % OP SOLN
1.0000 [drp] | OPHTHALMIC | Status: AC | PRN
  Administered 2023-09-21 (×3): 1 [drp] via OPHTHALMIC

## 2023-09-21 MED ORDER — EPINEPHRINE PF 1 MG/ML IJ SOLN
INTRAOCULAR | Status: DC | PRN
  Administered 2023-09-21: 1 mL via OPHTHALMIC

## 2023-09-21 MED ORDER — MOXIFLOXACIN HCL 5 MG/ML IO SOLN
INTRAOCULAR | Status: DC | PRN
  Administered 2023-09-21: .3 mL via INTRACAMERAL

## 2023-09-21 MED ORDER — SODIUM HYALURONATE 23MG/ML IO SOSY
PREFILLED_SYRINGE | INTRAOCULAR | Status: DC | PRN
  Administered 2023-09-21: .6 mL via INTRAOCULAR

## 2023-09-21 SURGICAL SUPPLY — 13 items
CATARACT SUITE SIGHTPATH (MISCELLANEOUS) ×1 IMPLANT
CLOTH BEACON ORANGE TIMEOUT ST (SAFETY) ×1 IMPLANT
EYE SHIELD UNIVERSAL CLEAR (GAUZE/BANDAGES/DRESSINGS) IMPLANT
FEE CATARACT SUITE SIGHTPATH (MISCELLANEOUS) ×1 IMPLANT
GLOVE BIOGEL PI IND STRL 7.0 (GLOVE) ×2 IMPLANT
GOWN STRL REUS W/TWL LRG LVL3 (GOWN DISPOSABLE) IMPLANT
LENS IOL TECNIS EYHANCE 23.5 (Intraocular Lens) IMPLANT
NDL HYPO 18GX1.5 BLUNT FILL (NEEDLE) ×1 IMPLANT
NEEDLE HYPO 18GX1.5 BLUNT FILL (NEEDLE) ×1 IMPLANT
PAD ARMBOARD POSITIONER FOAM (MISCELLANEOUS) ×1 IMPLANT
SYR TB 1ML LL NO SAFETY (SYRINGE) ×1 IMPLANT
TAPE SURG TRANSPORE 1 IN (GAUZE/BANDAGES/DRESSINGS) IMPLANT
WATER STERILE IRR 250ML POUR (IV SOLUTION) ×1 IMPLANT

## 2023-09-21 NOTE — Discharge Instructions (Signed)
 Please discharge patient when stable, will follow up today with Dr. June Leap at the [facility] office immediately following discharge.  Leave shield in place until visit.  All paperwork with discharge instructions will be given at the office.  [facility] Address:  [address]  [place], [place] [address]

## 2023-09-21 NOTE — Anesthesia Procedure Notes (Signed)
 Procedure Name: MAC Date/Time: 09/21/2023 9:52 AM  Performed by: Sherwin Donate, CRNAPre-anesthesia Checklist: Patient identified, Emergency Drugs available, Suction available and Patient being monitored Patient Re-evaluated:Patient Re-evaluated prior to induction Oxygen Delivery Method: Nasal cannula Placement Confirmation: positive ETCO2

## 2023-09-21 NOTE — Op Note (Signed)
 Date of procedure: 09/21/23  Pre-operative diagnosis:  Visually significant age-related nuclear cataract, Right Eye (H25.11)  Post-operative diagnosis:   1. Visually significant age-related nuclear cataract, Right Eye (H25.11) 2. Pain and inflammation following cataract surgery Right Eye (H57.11)  Procedure:  Removal of cataract via phacoemulsification and insertion of intra-ocular lens [employer] DIB00 +23.5D into the capsular bag of the Right Eye 2. Placement of Dextenza  insert, Right Eye  Attending surgeon: Pleas Brill. Kassaundra Hair, MD, MA  Anesthesia: MAC, Topical Akten  Complications: None  Estimated Blood Loss: <36mL (minimal)  Specimens: None  Implants: As above  Indications:  Visually significant age-related cataract, Right Eye  Procedure:  The patient was seen and identified in the pre-operative area. The operative eye was identified and dilated.  The operative eye was marked.  Topical anesthesia was administered to the operative eye.     The patient was then to the operative suite and placed in the supine position.  A timeout was performed confirming the patient, procedure to be performed, and all other relevant information.   The patient's face was prepped and draped in the usual fashion for intra-ocular surgery.  A lid speculum was placed into the operative eye and the surgical microscope moved into place and focused.  A superotemporal paracentesis was created using a 20 gauge paracentesis blade.  Shugarcaine was injected into the anterior chamber.  Viscoelastic was injected into the anterior chamber.  A temporal clear-corneal main wound incision was created using a 2.25mm microkeratome.  A continuous curvilinear capsulorrhexis was initiated using an irrigating cystitome and completed using capsulorrhexis forceps.  Hydrodissection and hydrodeliniation were performed.  Viscoelastic was injected into the anterior chamber.  A phacoemulsification handpiece and a chopper as a  second instrument were used to remove the nucleus and epinucleus. The irrigation/aspiration handpiece was used to remove any remaining cortical material.   The capsular bag was reinflated with viscoelastic, checked, and found to be intact.  The intraocular lens was inserted into the capsular bag.  The irrigation/aspiration handpiece was used to remove any remaining viscoelastic.  The clear corneal wound and paracentesis wounds were then hydrated and checked with Weck-Cels to be watertight. 0.1mL of moxifloxacin  was injected into the anterior chamber.  The lid-speculum was removed. The lower punctum was dilated. A Dextenza  implant was placed in the lower canaliculus without complication.  The drape was removed.  The patient's face was cleaned with a wet and dry 4x4. A clear shield was taped over the eye. The patient was taken to the post-operative care unit in good condition, having tolerated the procedure well.  Post-Op Instructions: The patient will follow up at [facility] for a same day post-operative evaluation and will receive all other orders and instructions.

## 2023-09-21 NOTE — Transfer of Care (Signed)
 Immediate Anesthesia Transfer of Care Note  Patient: Colleen [name]  Procedure(s) Performed: PHACOEMULSIFICATION, CATARACT, WITH IOL INSERTION (Right: Eye) INSERTION, IMPLANT, DEXAMETHASONE  ELUTING, LACRIMAL CANALICULUS (Right: Eye)  Patient Location: Short Stay  Anesthesia Type:MAC  Level of Consciousness: awake, alert , and oriented  Airway & Oxygen Therapy: Patient Spontanous Breathing  Post-op Assessment: Report given to RN and Post -op Vital signs reviewed and stable  Post vital signs: Reviewed and stable  Last Vitals:  Vitals Value Taken Time  BP    Temp    Pulse    Resp    SpO2      Last Pain:  Vitals:   09/21/23 0842  PainSc: 0-No pain         Complications: No notable events documented.

## 2023-09-21 NOTE — Interval H&P Note (Signed)
 History and Physical Interval Note:  09/21/2023 9:42 AM  Colleen [name]  has presented today for surgery, with the diagnosis of nuclear sclerotic cataract, right eye.  The various methods of treatment have been discussed with the patient and family. After consideration of risks, benefits and other options for treatment, the patient has consented to  Procedure(s): PHACOEMULSIFICATION, CATARACT, WITH IOL INSERTION (Right) as a surgical intervention.  The patient's history has been reviewed, patient examined, no change in status, stable for surgery.  I have reviewed the patient's chart and labs.  Questions were answered to the patient's satisfaction.     Tarri Farm

## 2023-09-21 NOTE — Anesthesia Postprocedure Evaluation (Signed)
 Anesthesia Post Note  Patient: Colleen [name]  Procedure(s) Performed: PHACOEMULSIFICATION, CATARACT, WITH IOL INSERTION (Right: Eye) INSERTION, IMPLANT, DEXAMETHASONE  ELUTING, LACRIMAL CANALICULUS (Right: Eye)  Patient location during evaluation: Phase II Anesthesia Type: MAC Level of consciousness: awake Pain management: pain level controlled Vital Signs Assessment: post-procedure vital signs reviewed and stable Respiratory status: spontaneous breathing and respiratory function stable Cardiovascular status: blood pressure returned to baseline and stable Postop Assessment: no headache and no apparent nausea or vomiting Anesthetic complications: no Comments: Late entry   No notable events documented.   Last Vitals:  Vitals:   09/21/23 0844 09/21/23 1012  BP: (!) 129/52 (!) 123/56  Pulse:  64  Resp: 18 13  Temp: 36.8 C 36.8 C  SpO2: 100% 100%    Last Pain:  Vitals:   09/21/23 1012  TempSrc: Oral  PainSc: 0-No pain                 Coretha Dew

## 2023-09-21 NOTE — Anesthesia Preprocedure Evaluation (Signed)
 Anesthesia Evaluation  Patient identified by MRN, date of birth, ID band Patient awake    Reviewed: Allergy & Precautions, H&P , NPO status , Patient's Chart, lab work & pertinent test results, reviewed documented beta blocker date and time   Airway Mallampati: II  TM Distance: >3 FB Neck ROM: full    Dental no notable dental hx. (+) Dental Advisory Given, Teeth Intact   Pulmonary neg pulmonary ROS, former smoker   Pulmonary exam normal breath sounds clear to auscultation       Cardiovascular Exercise Tolerance: Good hypertension, + DOE  Normal cardiovascular exam+ Valvular Problems/Murmurs  Rhythm:regular Rate:Normal     Neuro/Psych  PSYCHIATRIC DISORDERS  Depression    glaucoma  Neuromuscular disease    GI/Hepatic Neg liver ROS,GERD  ,,  Endo/Other  negative endocrine ROS    Renal/GU negative Renal ROS  negative genitourinary   Musculoskeletal  (+) Arthritis , Osteoarthritis,    Abdominal   Peds  Hematology negative hematology ROS (+)   Anesthesia Other Findings   Reproductive/Obstetrics negative OB ROS                             Anesthesia Physical Anesthesia Plan  ASA: 2  Anesthesia Plan: MAC   Post-op Pain Management: Minimal or no pain anticipated   Induction:   PONV Risk Score and Plan:   Airway Management Planned: Nasal Cannula and Natural Airway  Additional Equipment: None  Intra-op Plan:   Post-operative Plan:   Informed Consent: I have reviewed the patients History and Physical, chart, labs and discussed the procedure including the risks, benefits and alternatives for the proposed anesthesia with the patient or authorized representative who has indicated his/her understanding and acceptance.     Dental Advisory Given  Plan Discussed with: CRNA  Anesthesia Plan Comments:        Anesthesia Quick Evaluation

## 2023-09-24 ENCOUNTER — Encounter (HOSPITAL_COMMUNITY): Payer: Self-pay | Admitting: Ophthalmology

## 2023-10-03 ENCOUNTER — Ambulatory Visit (INDEPENDENT_AMBULATORY_CARE_PROVIDER_SITE_OTHER): Admitting: Family Medicine

## 2023-10-03 ENCOUNTER — Encounter: Payer: Self-pay | Admitting: Family Medicine

## 2023-10-03 VITALS — BP 125/72 | HR 64 | Resp 16 | Ht 62.0 in | Wt 138.1 lb

## 2023-10-03 DIAGNOSIS — I10 Essential (primary) hypertension: Secondary | ICD-10-CM

## 2023-10-03 DIAGNOSIS — E782 Mixed hyperlipidemia: Secondary | ICD-10-CM

## 2023-10-03 DIAGNOSIS — Z780 Asymptomatic menopausal state: Secondary | ICD-10-CM | POA: Diagnosis not present

## 2023-10-03 DIAGNOSIS — Z0001 Encounter for general adult medical examination with abnormal findings: Secondary | ICD-10-CM | POA: Insufficient documentation

## 2023-10-03 DIAGNOSIS — Z1231 Encounter for screening mammogram for malignant neoplasm of breast: Secondary | ICD-10-CM | POA: Diagnosis not present

## 2023-10-03 NOTE — Progress Notes (Signed)
    Colleen [name]     MRN: [id number]      DOB: [birth year]  Chief Complaint  Patient presents with   Annual Exam    Cpe     HPI: Patient is in for annual physical exam. No other health concerns are expressed or addressed at the visit. Recent labs,  are reviewed. Immunization is reviewed ,   PE: BP 125/72   Pulse 64   Resp 16   Ht 5\' 2"  (1.575 m)   Wt 138 lb 1 oz (62.6 kg)   SpO2 96%   BMI 25.25 kg/m   Pleasant  female, alert and oriented x 3, in no cardio-pulmonary distress. Afebrile. HEENT No facial trauma or asymetry. Sinuses non tender.  Extra occullar muscles intact.. External ears normal, . Neck: supple, no adenopathy,JVD or thyromegaly.No bruits.  Chest: Clear to ascultation bilaterally.No crackles or wheezes. Non tender to palpation  Cardiovascular system; Heart sounds normal,  S1 and  S2 ,no S3.  No murmur, or thrill. Apical beat not displaced Peripheral pulses normal.  Abdomen: Soft, non tender,   Musculoskeletal exam: Decreased though adequate  ROM of spine, hips , shoulders and knees.  deformity ,swelling and  crepitus noted.left knee No muscle wasting or atrophy.   Neurologic: Cranial nerves 2 to 12 intact. Power, tone ,sensation  normal throughout. Mild disturbance in gait. No tremor.  Skin: Intact, no ulceration, erythema , scaling or rash noted. Pigmentation normal throughout  Psych; Normal mood and affect. Judgement and concentration normal   Assessment & Plan:  Annual visit for general adult medical examination with abnormal findings Annual exam as documented.  Immunization and cancer screening needs are specifically addressed at this visit.   Hyperlipidemia Hyperlipidemia:Low fat diet discussed and encouraged.   Lipid Panel  Lab Results  Component Value Date   CHOL 141 07/18/2023   HDL 46 07/18/2023   LDLCALC 80 07/18/2023   TRIG 78 07/18/2023   CHOLHDL 3.1 07/18/2023     Updated lab needed at/ before next  visit.   Essential hypertension Controlled, no change in medication DASH diet and commitment to daily physical activity for a minimum of 30 minutes discussed and encouraged, as a part of hypertension management. The importance of attaining a healthy weight is also discussed.     10/03/2023    9:47 AM 09/21/2023   10:12 AM 09/21/2023    8:44 AM 09/07/2023    1:14 PM 09/07/2023   12:19 PM 07/18/2023   10:40 AM 07/18/2023    9:56 AM  BP/Weight  Systolic BP 125 123 129 133 125 148 144  Diastolic BP 72 56 52 56 57 70 78  Wt. (Lbs) 138.06  134.92  135  136.4  BMI 25.25 kg/m2  24.68 kg/m2  24.69 kg/m2  24.95 kg/m2

## 2023-10-03 NOTE — Assessment & Plan Note (Signed)
 Hyperlipidemia:Low fat diet discussed and encouraged.   Lipid Panel  Lab Results  Component Value Date   CHOL 141 07/18/2023   HDL 46 07/18/2023   LDLCALC 80 07/18/2023   TRIG 78 07/18/2023   CHOLHDL 3.1 07/18/2023     Updated lab needed at/ before next visit.

## 2023-10-03 NOTE — Assessment & Plan Note (Signed)
 Controlled, no change in medication DASH diet and commitment to daily physical activity for a minimum of 30 minutes discussed and encouraged, as a part of hypertension management. The importance of attaining a healthy weight is also discussed.     10/03/2023    9:47 AM 09/21/2023   10:12 AM 09/21/2023    8:44 AM 09/07/2023    1:14 PM 09/07/2023   12:19 PM 07/18/2023   10:40 AM 07/18/2023    9:56 AM  BP/Weight  Systolic BP 125 123 129 133 125 148 144  Diastolic BP 72 56 52 56 57 70 78  Wt. (Lbs) 138.06  134.92  135  136.4  BMI 25.25 kg/m2  24.68 kg/m2  24.69 kg/m2  24.95 kg/m2

## 2023-10-03 NOTE — Patient Instructions (Addendum)
 F/u in 5 months, call if you need me sooner  Please schedule mammogram and dexa at checkout  Please schedule AWV at check out , past due  Fasting lipid, cmp and eGFr 3 to 5 days before next visit  It is important that you exercise regularly at least 30 minutes 5 times a week. If you develop chest pain, have severe difficulty breathing, or feel very tired, stop exercising immediately and seek medical attention   Think about what you will eat, plan ahead. Choose " clean, green, fresh or frozen" over canned, processed or packaged foods which are more sugary, salty and fatty. 70 to 75% of food eaten should be vegetables and fruit. Three meals at set times with snacks allowed between meals, but they must be fruit or vegetables. Aim to eat over a 12 hour period , example 7 am to 7 pm, and STOP after  your last meal of the day. Drink water ,generally about 64 ounces per day, no other drink is as healthy. Fruit juice is best enjoyed in a healthy way, by EATING the fruit.   Thanks for choosing [facility], we consider it a privelige to serve you.

## 2023-10-03 NOTE — Assessment & Plan Note (Signed)
 Annual exam as documented. . Immunization and cancer screening needs are specifically addressed at this visit.

## 2023-10-15 ENCOUNTER — Ambulatory Visit (HOSPITAL_COMMUNITY)
Admission: RE | Admit: 2023-10-15 | Discharge: 2023-10-15 | Disposition: A | Discharge status: Home / Self Care | Source: Ambulatory Visit | Attending: Family Medicine | Admitting: Family Medicine

## 2023-10-15 DIAGNOSIS — Z780 Asymptomatic menopausal state: Secondary | ICD-10-CM | POA: Insufficient documentation

## 2023-10-15 DIAGNOSIS — M85851 Other specified disorders of bone density and structure, right thigh: Secondary | ICD-10-CM | POA: Diagnosis not present

## 2023-10-24 ENCOUNTER — Ambulatory Visit: Payer: Self-pay | Admitting: Family Medicine

## 2023-11-26 DIAGNOSIS — Z961 Presence of intraocular lens: Secondary | ICD-10-CM | POA: Diagnosis not present

## 2023-11-26 DIAGNOSIS — H40053 Ocular hypertension, bilateral: Secondary | ICD-10-CM | POA: Diagnosis not present

## 2023-11-26 DIAGNOSIS — H402231 Chronic angle-closure glaucoma, bilateral, mild stage: Secondary | ICD-10-CM | POA: Diagnosis not present

## 2023-11-26 DIAGNOSIS — H26493 Other secondary cataract, bilateral: Secondary | ICD-10-CM | POA: Diagnosis not present

## 2023-12-24 DIAGNOSIS — Z961 Presence of intraocular lens: Secondary | ICD-10-CM | POA: Diagnosis not present

## 2023-12-24 DIAGNOSIS — H26493 Other secondary cataract, bilateral: Secondary | ICD-10-CM | POA: Diagnosis not present

## 2023-12-24 DIAGNOSIS — H402231 Chronic angle-closure glaucoma, bilateral, mild stage: Secondary | ICD-10-CM | POA: Diagnosis not present

## 2023-12-24 DIAGNOSIS — H40053 Ocular hypertension, bilateral: Secondary | ICD-10-CM | POA: Diagnosis not present

## 2024-01-02 DIAGNOSIS — I10 Essential (primary) hypertension: Secondary | ICD-10-CM | POA: Diagnosis not present

## 2024-01-02 DIAGNOSIS — E782 Mixed hyperlipidemia: Secondary | ICD-10-CM | POA: Diagnosis not present

## 2024-01-03 ENCOUNTER — Telehealth: Payer: Self-pay

## 2024-01-03 LAB — CMP14+EGFR
ALT: 21 IU/L (ref 0–32)
AST: 27 IU/L (ref 0–40)
Albumin: 4.4 g/dL (ref 3.8–4.8)
Alkaline Phosphatase: 117 IU/L (ref 44–121)
BUN/Creatinine Ratio: 22 (ref 12–28)
BUN: 20 mg/dL (ref 8–27)
Bilirubin Total: 0.6 mg/dL (ref 0.0–1.2)
CO2: 23 mmol/L (ref 20–29)
Calcium: 9.6 mg/dL (ref 8.7–10.3)
Chloride: 103 mmol/L (ref 96–106)
Creatinine, Ser: 0.92 mg/dL (ref 0.57–1.00)
Globulin, Total: 2.7 g/dL (ref 1.5–4.5)
Glucose: 77 mg/dL (ref 70–99)
Potassium: 4.1 mmol/L (ref 3.5–5.2)
Sodium: 142 mmol/L (ref 134–144)
Total Protein: 7.1 g/dL (ref 6.0–8.5)
eGFR: 64 mL/min/1.73 (ref >59)

## 2024-01-03 LAB — LIPID PANEL W/O CHOL/HDL RATIO
Cholesterol, Total: 167 mg/dL (ref 100–199)
HDL: 53 mg/dL (ref >39)
LDL Chol Calc (NIH): 102 mg/dL — ABNORMAL HIGH (ref 0–99)
Triglycerides: 63 mg/dL (ref 0–149)
VLDL Cholesterol Cal: 12 mg/dL (ref 5–40)

## 2024-01-03 NOTE — Telephone Encounter (Signed)
 rescheduled

## 2024-01-03 NOTE — Telephone Encounter (Signed)
 Copied from CRM [id number]. Topic: General - Other >> Jan 03, 2024  8:31 AM Larissa S wrote: Reason for CRM: Pt would like to know if she needs to retake her labs for her 10/30 appointment. She is concerned that she did them to early. Patient had labs done on 01/02/24. Please contact patient to advise.

## 2024-01-14 ENCOUNTER — Ambulatory Visit (HOSPITAL_COMMUNITY)

## 2024-01-16 ENCOUNTER — Ambulatory Visit (HOSPITAL_COMMUNITY)
Admission: RE | Admit: 2024-01-16 | Discharge: 2024-01-16 | Disposition: A | Discharge status: Home / Self Care | Source: Ambulatory Visit | Attending: Family Medicine | Admitting: Family Medicine

## 2024-01-16 ENCOUNTER — Encounter (HOSPITAL_COMMUNITY): Payer: Self-pay

## 2024-01-16 DIAGNOSIS — Z1231 Encounter for screening mammogram for malignant neoplasm of breast: Secondary | ICD-10-CM | POA: Insufficient documentation

## 2024-01-22 ENCOUNTER — Ambulatory Visit (INDEPENDENT_AMBULATORY_CARE_PROVIDER_SITE_OTHER)

## 2024-01-22 DIAGNOSIS — Z23 Encounter for immunization: Secondary | ICD-10-CM

## 2024-03-06 ENCOUNTER — Ambulatory Visit: Admitting: Family Medicine

## 2024-03-08 ENCOUNTER — Other Ambulatory Visit: Payer: Self-pay | Admitting: Family Medicine

## 2024-03-24 DIAGNOSIS — H402231 Chronic angle-closure glaucoma, bilateral, mild stage: Secondary | ICD-10-CM | POA: Diagnosis not present

## 2024-03-24 DIAGNOSIS — H40053 Ocular hypertension, bilateral: Secondary | ICD-10-CM | POA: Diagnosis not present

## 2024-03-24 DIAGNOSIS — Z961 Presence of intraocular lens: Secondary | ICD-10-CM | POA: Diagnosis not present

## 2024-03-24 DIAGNOSIS — H26493 Other secondary cataract, bilateral: Secondary | ICD-10-CM | POA: Diagnosis not present

## 2024-04-06 ENCOUNTER — Other Ambulatory Visit: Payer: Self-pay | Admitting: Family Medicine

## 2024-04-06 DIAGNOSIS — E785 Hyperlipidemia, unspecified: Secondary | ICD-10-CM

## 2024-04-22 NOTE — Progress Notes (Signed)
° °  04/22/2024  Patient ID: Colleen [name] Agent, female   DOB: 1946-01-02, 78 y.o.   MRN: [id number]  Pharmacy Quality Measure Review  This patient is appearing on a report for being at risk of failing the adherence measure for cholesterol (statin) medications this calendar year.   Medication: rosuvastatin  5mg  Last fill date: 04/08/24 for 84 day supply  Insurance report was not up to date. No action needed at this time.   Lang Sieve, PharmD, BCGP Clinical Pharmacist  [phone]

## 2024-05-21 ENCOUNTER — Ambulatory Visit

## 2024-05-21 VITALS — Ht 62.0 in | Wt 141.0 lb

## 2024-05-21 DIAGNOSIS — Z0000 Encounter for general adult medical examination without abnormal findings: Secondary | ICD-10-CM | POA: Diagnosis not present

## 2024-05-21 NOTE — Progress Notes (Signed)
 "  Chief Complaint  Patient presents with   Medicare Wellness     Subjective:   Colleen [name] is a 79 y.o. female who presents for a Medicare Annual Wellness Visit.  Visit info / Clinical Intake: Medicare Wellness Visit Type:: Subsequent Annual Wellness Visit Persons participating in visit and providing information:: patient Medicare Wellness Visit Mode:: Telephone If telephone:: video declined Since this visit was completed virtually, some vitals may be partially provided or unavailable. Missing vitals are due to the limitations of the virtual format.: Documented vitals are patient reported If Telephone or Video please confirm:: I connected with patient using audio/video enable telemedicine. I verified patient identity with two identifiers, discussed telehealth limitations, and patient agreed to proceed. Patient Location:: Home Provider Location:: Office Interpreter Needed?: No Pre-visit prep was completed: yes AWV questionnaire completed by patient prior to visit?: no Living arrangements:: lives with spouse/significant other Patient's Overall Health Status Rating: good Typical amount of pain: none Does pain affect daily life?: no Are you currently prescribed opioids?: no  Dietary Habits and Nutritional Risks How many meals a day?: 2 Eats fruit and vegetables daily?: yes Most meals are obtained by: preparing own meals; eating out In the last 2 weeks, have you had any of the following?: none Diabetic:: no  Functional Status Activities of Daily Living (to include ambulation/medication): Independent Ambulation: Independent with device- listed below Home Assistive Devices/Equipment: Eyeglasses; Cane Medication Administration: Independent Home Management (perform basic housework or laundry): Independent Manage your own finances?: yes Primary transportation is: driving Concerns about vision?: no *vision screening is required for WTM* Concerns about hearing?: no  Fall  Screening Falls in the past year?: 0 Number of falls in past year: 0 Was there an injury with Fall?: 0 Fall Risk Category Calculator: 0 Patient Fall Risk Level: Low Fall Risk  Fall Risk Patient at Risk for Falls Due to: No Fall Risks Fall risk Follow up: Falls evaluation completed; Falls prevention discussed  Home and Transportation Safety: All rugs have non-skid backing?: N/A, no rugs All stairs or steps have railings?: yes Grab bars in the bathtub or shower?: yes Have non-skid surface in bathtub or shower?: yes Good home lighting?: yes Regular seat belt use?: yes Hospital stays in the last year:: no  Cognitive Assessment Difficulty concentrating, remembering, or making decisions? : no Will 6CIT or Mini Cog be Completed: yes What year is it?: 0 points What month is it?: 0 points Give patient an address phrase to remember (5 components): [address] [place], [place] About what time is it?: 0 points Count backwards from 20 to 1: 0 points Say the months of the year in reverse: 0 points Repeat the address phrase from earlier: 0 points 6 CIT Score: 0 points  Advance Directives (For Healthcare) Does Patient Have a Medical Advance Directive?: No Would patient like information on creating a medical advance directive?: No - Patient declined  Reviewed/Updated  Reviewed/Updated: Reviewed All (Medical, Surgical, Family, Medications, Allergies, Care Teams, Patient Goals)    Allergies (verified) Aspirin, Fenofibrate , Statins, and Flonase  [fluticasone ]   Current Medications (verified) Outpatient Encounter Medications as of 05/21/2024  Medication Sig   acetaminophen  (TYLENOL ) 650 MG CR tablet Take 650-1,300 mg by mouth every 8 (eight) hours as needed for pain.   amLODipine  (NORVASC ) 2.5 MG tablet TAKE 1 TABLET EVERY DAY   Artificial Tear Solution (SOOTHE XP) SOLN Place 1 application into both eyes 3 (three) times daily.    Ascorbic Acid (VITAMIN C) 1000 MG tablet Take 1,000  mg by  mouth daily.   cholecalciferol (VITAMIN D3) 25 MCG (1000 UNIT) tablet Take 1,000 Units by mouth daily.   Coenzyme Q10 (COQ10) 200 MG CAPS Take 200 mg by mouth every other day.   lisinopril -hydrochlorothiazide  (ZESTORETIC ) 20-25 MG tablet TAKE 1 TABLET EVERY DAY   montelukast  (SINGULAIR ) 10 MG tablet Take 1 tablet (10 mg total) by mouth at bedtime.   Multiple Vitamin (MULTIVITAMIN WITH MINERALS) TABS tablet Take 1 tablet by mouth daily.   omeprazole  (PRILOSEC) 20 MG capsule Take 1 capsule (20 mg total) by mouth daily.   polyethylene glycol powder (MIRALAX ) 17 GM/SCOOP powder Take one capful twice daily until soft bowel movement, then continue once daily as needed to maintain regular bowel movement. (Patient taking differently: Take 17 g by mouth daily as needed for moderate constipation.)   rosuvastatin  (CRESTOR ) 5 MG tablet TAKE 1 TABLET ON MONDAY, WEDNESDAY, FRIDAY AND SUNDAY IN THE MORNING   latanoprost (XALATAN) 0.005 % ophthalmic solution SMARTSIG:In Eye(s) (Patient not taking: Reported on 05/21/2024)   Latanoprostene Bunod (VYZULTA) 0.024 % SOLN Place 1 drop into both eyes at bedtime. (Patient not taking: Reported on 05/21/2024)   pyridOXINE (VITAMIN B6) 100 MG tablet Take 100 mg by mouth daily. (Patient not taking: Reported on 05/21/2024)   No facility-administered encounter medications on file as of 05/21/2024.    History: Past Medical History:  Diagnosis Date   GERD (gastroesophageal reflux disease)    Glaucoma    Hyperlipidemia    Hypertension    Past Surgical History:  Procedure Laterality Date   CARPAL TUNNEL RELEASE Right 06/06/2023   Procedure: CARPAL TUNNEL RELEASE;  Surgeon: Margrette Taft BRAVO, MD;  Location: [facility];  Service: Orthopedics;  Laterality: Right;   CATARACT EXTRACTION W/PHACO Left 09/07/2023   Procedure: PHACOEMULSIFICATION, CATARACT, WITH IOL INSERTION;  Surgeon: Harrie Agent, MD;  Location: [facility];  Service: Ophthalmology;  Laterality: Left;  CDE: 8.56    CATARACT EXTRACTION W/PHACO Right 09/21/2023   Procedure: PHACOEMULSIFICATION, CATARACT, WITH IOL INSERTION;  Surgeon: Harrie Agent, MD;  Location: [facility];  Service: Ophthalmology;  Laterality: Right;  CDE 8.40   CHOLECYSTECTOMY     CHONDROPLASTY Left 12/11/2014   Procedure: CHONDROPLASTY LEFT MEDIAL FEMORAL CONDYLE;  Surgeon: Taft BRAVO Margrette, MD;  Location: [facility];  Service: Orthopedics;  Laterality: Left;   COLONOSCOPY WITH PROPOFOL  N/A 02/27/2020   Carver: Fair prep, diverticulosis, nonbleeding internal hemorrhoids.  Consider Next colonoscopy 10 years   INSERTION, IMPLANT, DEXAMETHASONE  ELUTING, LACRIMAL CANALICULUS Right 09/21/2023   Procedure: INSERTION, IMPLANT, DEXAMETHASONE  ELUTING, LACRIMAL CANALICULUS;  Surgeon: Harrie Agent, MD;  Location: [facility];  Service: Ophthalmology;  Laterality: Right;   INSERTION, STENT, DRUG-ELUTING, LACRIMAL CANALICULUS Left 09/07/2023   Procedure: INSERTION, STENT, DRUG-ELUTING, LACRIMAL CANALICULUS;  Surgeon: Harrie Agent, MD;  Location: [facility];  Service: Ophthalmology;  Laterality: Left;   KNEE ARTHROSCOPY WITH LATERAL MENISECTOMY Left 12/11/2014   Procedure: KNEE ARTHROSCOPY WITH LATERAL AND MEDIAL MENISECTOMY;  Surgeon: Taft BRAVO Margrette, MD;  Location: [facility];  Service: Orthopedics;  Laterality: Left;   Family History  Problem Relation Age of Onset   Hypertension Mother    Heart disease Mother 33       MI   Hypertension Father    Diabetes Father    Stroke Father    Hypertension Sister    Diabetes Sister    Multiple sclerosis Sister    Hypertension Sister    Diabetes Sister    Heart disease Sister    Kidney disease Sister  Hypertension Brother    Schizophrenia Brother 9   Social History   Occupational History   Not on file  Tobacco Use   Smoking status: Former   Smokeless tobacco: Never  Vaping Use   Vaping status: Never Used  Substance and Sexual Activity   Alcohol use: No   Drug use: No   Sexual activity: Not Currently    Tobacco Counseling Counseling given: Yes  SDOH Screenings   Food Insecurity: No Food Insecurity (05/21/2024)  Housing: Unknown (05/21/2024)  Transportation Needs: No Transportation Needs (05/21/2024)  Utilities: Not At Risk (05/21/2024)  Alcohol Screen: Low Risk (07/06/2022)  Depression (PHQ2-9): Low Risk (05/21/2024)  Financial Resource Strain: Low Risk (07/06/2022)  Physical Activity: Sufficiently Active (05/21/2024)  Social Connections: Moderately Isolated (05/21/2024)  Stress: No Stress Concern Present (05/21/2024)  Tobacco Use: Medium Risk (05/21/2024)  Health Literacy: Adequate Health Literacy (05/21/2024)   See flowsheets for full screening details  Depression Screen PHQ 2 & 9 Depression Scale- Over the past 2 weeks, how often have you been bothered by any of the following problems? Little interest or pleasure in doing things: 0 Feeling down, depressed, or hopeless (PHQ Adolescent also includes...irritable): 0 PHQ-2 Total Score: 0 Trouble falling or staying asleep, or sleeping too much: 0 Feeling tired or having little energy: 1 Poor appetite or overeating (PHQ Adolescent also includes...weight loss): 0 Feeling bad about yourself - or that you are a failure or have let yourself or your family down: 0 Trouble concentrating on things, such as reading the newspaper or watching television (PHQ Adolescent also includes...like school work): 0 Moving or speaking so slowly that other people could have noticed. Or the opposite - being so fidgety or restless that you have been moving around a lot more than usual: 3 (uses a cane) Thoughts that you would be better off dead, or of hurting yourself in some way: 0 PHQ-9 Total Score: 4 If you checked off any problems, how difficult have these problems made it for you to do your work, take care of things at home, or get along with other people?: Not difficult at all  Depression Treatment Depression Interventions/Treatment : EYV7-0 Score <4  Follow-up Not Indicated     Goals Addressed               This Visit's Progress     Patient Stated (pt-stated)        Patient stated she plans to continue exercising and drinking much water              Objective:    Today's Vitals   05/21/24 1053  Weight: 141 lb (64 kg)  Height: 5' 2 (1.575 m)   Body mass index is 25.79 kg/m.  Hearing/Vision screen Hearing Screening - Comments:: Denies hearing difficulties   Vision Screening - Comments:: Wears eyeglasses for reading - up to date with routine eye exams with an Optometrist Immunizations and Health Maintenance Health Maintenance  Topic Date Due   COVID-19 Vaccine (7 - 2025-26 season) 01/07/2024   Medicare Annual Wellness (AWV)  05/21/2025   Colonoscopy  02/26/2030   DTaP/Tdap/Td (3 - Td or Tdap) 05/17/2032   Pneumococcal Vaccine: 50+ Years  Completed   Influenza Vaccine  Completed   Bone Density Scan  Completed   Hepatitis C Screening  Completed   Zoster Vaccines- Shingrix  Completed   Meningococcal B Vaccine  Aged Out   Mammogram  Discontinued   Fecal DNA (Cologuard)  Discontinued  Assessment/Plan:  This is a routine wellness examination for [place].  Patient Care Team: Antonetta Rollene BRAVO, MD as PCP - General Debera Jayson MATSU, MD as PCP - Cardiology (Cardiology) Margrette Taft BRAVO, MD as Consulting Physician (Orthopedic Surgery) Cindie Carlin POUR, DO as Consulting Physician (Internal Medicine)  I have personally reviewed and noted the following in the patients chart:   Medical and social history Use of alcohol, tobacco or illicit drugs  Current medications and supplements including opioid prescriptions. Functional ability and status Nutritional status Physical activity Advanced directives List of other physicians Hospitalizations, surgeries, and ER visits in previous 12 months Vitals Screenings to include cognitive, depression, and falls Referrals and appointments  No orders of the  defined types were placed in this encounter.  In addition, I have reviewed and discussed with patient certain preventive protocols, quality metrics, and best practice recommendations. A written personalized care plan for preventive services as well as general preventive health recommendations were provided to patient.   Verdie CHRISTELLA Saba, CMA   05/21/2024   Return in 1 year (on 05/21/2025).  After Visit Summary: (MyChart) Due to this being a telephonic visit, the after visit summary with patients personalized plan was offered to patient via MyChart   Nurse Notes: Appt w/PCP on 05/22/2024; scheduled 2027 AWV appt "

## 2024-05-21 NOTE — Patient Instructions (Addendum)
 Colleen [name],  Thank you for taking the time for your Medicare Wellness Visit. I appreciate your continued commitment to your health goals. Please review the care plan we discussed, and feel free to reach out if I can assist you further.  Please note that Annual Wellness Visits do not include a physical exam. Some assessments may be limited, especially if the visit was conducted virtually. If needed, we may recommend an in-person follow-up with your provider.  Ongoing Care Seeing your primary care provider every 3 to 6 months helps us  monitor your health and provide consistent, personalized care.   Referrals If a referral was made during today's visit and you haven't received any updates within two weeks, please contact the referred provider directly to check on the status.  Recommended Screenings:  Health Maintenance  Topic Date Due   COVID-19 Vaccine (7 - 2025-26 season) 01/07/2024   Medicare Annual Wellness Visit  05/21/2025   Colon Cancer Screening  02/26/2030   DTaP/Tdap/Td vaccine (3 - Td or Tdap) 05/17/2032   Pneumococcal Vaccine for age over 70  Completed   Flu Shot  Completed   Osteoporosis screening with Bone Density Scan  Completed   Hepatitis C Screening  Completed   Zoster (Shingles) Vaccine  Completed   Meningitis B Vaccine  Aged Out   Breast Cancer Screening  Discontinued   Cologuard (Stool DNA test)  Discontinued       05/21/2024   10:56 AM  Advanced Directives  Does Patient Have a Medical Advance Directive? No  Would patient like information on creating a medical advance directive? No - Patient declined    Vision: Annual vision screenings are recommended for early detection of glaucoma, cataracts, and diabetic retinopathy. These exams can also reveal signs of chronic conditions such as diabetes and high blood pressure.  Dental: Annual dental screenings help detect early signs of oral cancer, gum disease, and other conditions linked to overall health, including  heart disease and diabetes.

## 2024-05-22 ENCOUNTER — Ambulatory Visit: Admitting: Family Medicine

## 2024-05-22 ENCOUNTER — Other Ambulatory Visit: Payer: Self-pay | Admitting: Family Medicine

## 2024-05-22 ENCOUNTER — Encounter: Payer: Self-pay | Admitting: Family Medicine

## 2024-05-22 VITALS — BP 148/80 | HR 67 | Resp 16 | Ht 62.0 in | Wt 141.0 lb

## 2024-05-22 DIAGNOSIS — I10 Essential (primary) hypertension: Secondary | ICD-10-CM

## 2024-05-22 DIAGNOSIS — I5189 Other ill-defined heart diseases: Secondary | ICD-10-CM | POA: Insufficient documentation

## 2024-05-22 DIAGNOSIS — E782 Mixed hyperlipidemia: Secondary | ICD-10-CM | POA: Diagnosis not present

## 2024-05-22 DIAGNOSIS — Z9189 Other specified personal risk factors, not elsewhere classified: Secondary | ICD-10-CM | POA: Diagnosis not present

## 2024-05-22 DIAGNOSIS — R011 Cardiac murmur, unspecified: Secondary | ICD-10-CM | POA: Diagnosis not present

## 2024-05-22 DIAGNOSIS — E559 Vitamin D deficiency, unspecified: Secondary | ICD-10-CM

## 2024-05-22 MED ORDER — OMEPRAZOLE 20 MG PO CPDR: 20.0000 mg | DELAYED_RELEASE_CAPSULE | Freq: Every day | ORAL | 2 refills | Status: AC

## 2024-05-22 NOTE — Patient Instructions (Addendum)
 Annual exam in June   Labs today CBC, lipid, cmp and EGFR, TSh and vit SD   You are referred for cardiac ct scan and also to see heart specialist   It is important that you exercise regularly at least 30 minutes 5 times a week. If you develop chest pain, have severe difficulty breathing, or feel very tired, stop exercising immediately and seek medical attention    Think about what you will eat, plan ahead. Choose  clean, green, fresh or frozen over canned, processed or packaged foods which are more sugary, salty and fatty. 70 to 75% of food eaten should be vegetables and fruit. Three meals at set times with snacks allowed between meals, but they must be fruit or vegetables. Aim to eat over a 12 hour period , example 7 am to 7 pm, and STOP after  your last meal of the day. Drink water ,generally about 64 ounces per day, no other drink is as healthy. Fruit juice is best enjoyed in a healthy way, by EATING the fruit. Thanks for choosing [facility], we consider it a privelige to serve you.

## 2024-05-22 NOTE — Assessment & Plan Note (Signed)
 Increasing fatigue and poor exercise tolerance x 4 months, refer to Cardiology

## 2024-05-22 NOTE — Assessment & Plan Note (Signed)
 Hyperlipidemia:Low fat diet discussed and encouraged.   Lipid Panel  Lab Results  Component Value Date   CHOL 167 01/02/2024   HDL 53 01/02/2024   LDLCALC 102 (H) 01/02/2024   TRIG 63 01/02/2024   CHOLHDL 3.1 07/18/2023     Updated lab needed at/ before next visit.

## 2024-05-22 NOTE — Assessment & Plan Note (Addendum)
 DASH diet and commitment to daily physical activity for a minimum of 30 minutes discussed and encouraged, as a part of hypertension management. The importance of attaining a healthy weight is also discussed. Elevated , work on lifestyle change , no med change made at this visit     05/22/2024   10:34 AM 05/22/2024   10:09 AM 05/21/2024   10:53 AM 10/03/2023    9:47 AM 09/21/2023   10:12 AM 09/21/2023    8:44 AM 09/07/2023    1:14 PM  BP/Weight  Systolic BP 148 150 -- 125 123 129 133  Diastolic BP 80 74 -- 72 56 52 56  Wt. (Lbs)  141 141 138.06  134.92   BMI  25.79 kg/m2 25.79 kg/m2 25.25 kg/m2  24.68 kg/m2      '

## 2024-05-22 NOTE — Assessment & Plan Note (Signed)
 Refer cardiology to re evaluate c/o increased fatigue and low energy

## 2024-05-22 NOTE — Progress Notes (Signed)
" ° °  Colleen [name]     MRN: [id number]      DOB: Aug 02, 1945  Chief Complaint  Patient presents with   Hypertension    5 month follow up     HPI Colleen [name] is here for follow up and re-evaluation of chronic medical conditions, medication management and review of any available recent lab and radiology data.  Preventive health is updated, specifically  Cancer screening and Immunization.   Questions or concerns regarding consultations or procedures which the PT has had in the interim are  addressed. The PT denies any adverse reactions to current medications since the last visit.  C/o increased exertional fatigue and poor exercise tolerance, feels tired most of the time and has to rest doing house chores, worsening over past 4 to 5 months, also has intermittent chest discomfort with activity , not on a consistent basis  ROS Denies recent fever or chills. Denies sinus pressure, nasal congestion, ear pain or sore throat. Denies chest congestion, productive cough or wheezing.  Denies abdominal pain, nausea, vomiting,diarrhea or constipation.   Denies dysuria, frequency, hesitancy or incontinence. C/o  joint pain,  and limitation in mobility. Denies headaches, seizures, numbness, or tingling. C/o  depression and  anxiety no interest in medication or therapy, main concern is her alcoholic spouseDenies skin break down or rash.   PE  BP (!) 148/80   Pulse 67   Resp 16   Ht 5' 2 (1.575 m)   Wt 141 lb (64 kg)   SpO2 97%   BMI 25.79 kg/m   Patient alert and oriented and in no cardiopulmonary distress.  HEENT: No facial asymmetry, EOMI,     Neck supple .  Chest: Clear to auscultation bilaterally.  CVS: S1, S2 no murmurs, no S3.Regular rate.  ABD: Soft non tender.   Ext: No edema  MS: Adequate ROM spine, shoulders, hips and knees.  Skin: Intact, no ulcerations or rash noted.  Psych: Good eye contact, normal affect. Memory intact not anxious or depressed appearing.  CNS: CN 2-12  intact, power,  normal throughout.no focal deficits noted.   Assessment & Plan  Grade I diastolic dysfunction Increasing fatigue and poor exercise tolerance x 4 months, refer to Cardiology  Heart murmur, systolic Refer cardiology to re evaluate c/o increased fatigue and low energy  Hyperlipidemia Hyperlipidemia:Low fat diet discussed and encouraged.   Lipid Panel  Lab Results  Component Value Date   CHOL 167 01/02/2024   HDL 53 01/02/2024   LDLCALC 102 (H) 01/02/2024   TRIG 63 01/02/2024   CHOLHDL 3.1 07/18/2023     Updated lab needed at/ before next visit.   Essential hypertension DASH diet and commitment to daily physical activity for a minimum of 30 minutes discussed and encouraged, as a part of hypertension management. The importance of attaining a healthy weight is also discussed. Elevated , work on lifestyle change , no med change made at this visit     05/22/2024   10:34 AM 05/22/2024   10:09 AM 05/21/2024   10:53 AM 10/03/2023    9:47 AM 09/21/2023   10:12 AM 09/21/2023    8:44 AM 09/07/2023    1:14 PM  BP/Weight  Systolic BP 148 150 -- 125 123 129 133  Diastolic BP 80 74 -- 72 56 52 56  Wt. (Lbs)  141 141 138.06  134.92   BMI  25.79 kg/m2 25.79 kg/m2 25.25 kg/m2  24.68 kg/m2      '  "

## 2024-05-23 ENCOUNTER — Ambulatory Visit: Payer: Self-pay | Admitting: Family Medicine

## 2024-05-23 LAB — CBC WITH DIFFERENTIAL/PLATELET
Basophils Absolute: 0 x10E3/uL (ref 0.0–0.2)
Basos: 1 %
EOS (ABSOLUTE): 0 x10E3/uL (ref 0.0–0.4)
Eos: 1 %
Hematocrit: 36.9 % (ref 34.0–46.6)
Hemoglobin: 12.6 g/dL (ref 11.1–15.9)
Immature Grans (Abs): 0 x10E3/uL (ref 0.0–0.1)
Immature Granulocytes: 0 %
Lymphocytes Absolute: 2 x10E3/uL (ref 0.7–3.1)
Lymphs: 43 %
MCH: 31.1 pg (ref 26.6–33.0)
MCHC: 34.1 g/dL (ref 31.5–35.7)
MCV: 91 fL (ref 79–97)
Monocytes Absolute: 0.3 x10E3/uL (ref 0.1–0.9)
Monocytes: 6 %
Neutrophils Absolute: 2.3 x10E3/uL (ref 1.4–7.0)
Neutrophils: 49 %
Platelets: 253 x10E3/uL (ref 150–450)
RBC: 4.05 x10E6/uL (ref 3.77–5.28)
RDW: 12.5 % (ref 11.7–15.4)
WBC: 4.6 x10E3/uL (ref 3.4–10.8)

## 2024-05-23 LAB — CMP14+EGFR
ALT: 16 IU/L (ref 0–32)
AST: 23 IU/L (ref 0–40)
Albumin: 4.4 g/dL (ref 3.8–4.8)
Alkaline Phosphatase: 105 IU/L (ref 49–135)
BUN/Creatinine Ratio: 16 (ref 12–28)
BUN: 15 mg/dL (ref 8–27)
Bilirubin Total: 0.4 mg/dL (ref 0.0–1.2)
CO2: 24 mmol/L (ref 20–29)
Calcium: 9.7 mg/dL (ref 8.7–10.3)
Chloride: 104 mmol/L (ref 96–106)
Creatinine, Ser: 0.93 mg/dL (ref 0.57–1.00)
Globulin, Total: 2.7 g/dL (ref 1.5–4.5)
Glucose: 95 mg/dL (ref 70–99)
Potassium: 4.3 mmol/L (ref 3.5–5.2)
Sodium: 141 mmol/L (ref 134–144)
Total Protein: 7.1 g/dL (ref 6.0–8.5)
eGFR: 63 mL/min/1.73

## 2024-05-23 LAB — LIPID PANEL
Chol/HDL Ratio: 2.8 ratio (ref 0.0–4.4)
Cholesterol, Total: 166 mg/dL (ref 100–199)
HDL: 60 mg/dL
LDL Chol Calc (NIH): 92 mg/dL (ref 0–99)
Triglycerides: 74 mg/dL (ref 0–149)
VLDL Cholesterol Cal: 14 mg/dL (ref 5–40)

## 2024-05-23 LAB — TSH: TSH: 1.73 u[IU]/mL (ref 0.450–4.500)

## 2024-05-23 LAB — VITAMIN D 25 HYDROXY (VIT D DEFICIENCY, FRACTURES): Vit D, 25-Hydroxy: 44.1 ng/mL (ref 30.0–100.0)

## 2024-06-16 ENCOUNTER — Other Ambulatory Visit (HOSPITAL_COMMUNITY)

## 2024-06-26 ENCOUNTER — Ambulatory Visit: Admitting: Student

## 2024-10-23 ENCOUNTER — Encounter: Payer: Self-pay | Admitting: Family Medicine

## 2025-05-22 ENCOUNTER — Ambulatory Visit: Payer: Self-pay
